# Patient Record
Sex: Female | Born: 2000 | Race: Black or African American | Hispanic: No | Marital: Single | Smoking: Never smoker
Health system: Southern US, Community
[De-identification: ages and names within clinical notes are randomized; demographics above are authoritative.]

## PROBLEM LIST (undated history)

## (undated) DIAGNOSIS — G43909 Migraine, unspecified, not intractable, without status migrainosus: Secondary | ICD-10-CM

## (undated) DIAGNOSIS — B009 Herpesviral infection, unspecified: Secondary | ICD-10-CM

## (undated) HISTORY — DX: Herpesviral infection, unspecified: B00.9

---
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
## 2018-02-03 ENCOUNTER — Other Ambulatory Visit: Payer: Self-pay

## 2018-02-03 ENCOUNTER — Emergency Department (HOSPITAL_COMMUNITY)
Admission: EM | Admit: 2018-02-03 | Discharge: 2018-02-04 | Disposition: A | Discharge status: Home / Self Care | Payer: Medicaid Other | Attending: Emergency Medicine | Admitting: Emergency Medicine

## 2018-02-03 ENCOUNTER — Encounter (HOSPITAL_COMMUNITY): Payer: Self-pay | Admitting: Emergency Medicine

## 2018-02-03 DIAGNOSIS — Z79899 Other long term (current) drug therapy: Secondary | ICD-10-CM | POA: Diagnosis not present

## 2018-02-03 DIAGNOSIS — K0889 Other specified disorders of teeth and supporting structures: Secondary | ICD-10-CM | POA: Diagnosis not present

## 2018-02-03 NOTE — ED Triage Notes (Signed)
Co R upper dental abscess x 4 days.  Denies fever.

## 2018-02-04 MED ORDER — IBUPROFEN 600 MG PO TABS: 600.0000 mg | ORAL_TABLET | Freq: Four times a day (QID) | ORAL | 0 refills | Status: DC | PRN

## 2018-02-04 MED ORDER — LIDOCAINE VISCOUS HCL 2 % MT SOLN: 15.0000 mL | OROMUCOSAL | 2 refills | Status: DC | PRN

## 2018-02-04 MED ORDER — PENICILLIN V POTASSIUM 500 MG PO TABS: 500.0000 mg | ORAL_TABLET | Freq: Four times a day (QID) | ORAL | 0 refills | Status: AC

## 2018-02-04 NOTE — ED Provider Notes (Signed)
[facility] Provider Note   CSN: [id number] Arrival date & time: 02/03/18  2258     History   Chief Complaint Chief Complaint  Patient presents with  . dental abscess    HPI Sharon Steele is a 17 y.o. female.  HPI   Sharon Steele is a 17 y.o. female, patient with no pertinent past medical history, presenting to the ED with right upper dental pain beginning yesterday morning.  Accompanied by facial swelling.  Pain is moderate to severe, throbbing, nonradiating.  She has not tried any therapies for the pain.  She has a dental appointment scheduled for September 18. Denies fever/chills, nausea/vomiting, difficulty breathing or swallowing, or any other complaints.   History reviewed. No pertinent past medical history.  There are no active problems to display for this patient.   History reviewed. No pertinent surgical history.   OB History   None      Home Medications    Prior to Admission medications   Medication Sig Start Date End Date Taking? Authorizing Provider  cetirizine (ZYRTEC) 10 MG tablet Take 1 tablet (10 mg total) by mouth daily. For 3 days 09/06/16 09/09/16  Ree Shayeis, Jamie, MD  diclofenac (VOLTAREN) 50 MG EC tablet Take 1 tablet (50 mg total) by mouth 2 (two) times daily. 12/08/17   Elson AreasSofia, Leslie K, PA-C  ibuprofen (ADVIL,MOTRIN) 600 MG tablet Take 1 tablet (600 mg total) by mouth every 6 (six) hours as needed. 02/04/18   Natausha Jungwirth C, PA-C  ipratropium (ATROVENT) 0.06 % nasal spray Place 2 sprays into both nostrils 4 (four) times daily. Patient not taking: Reported on 09/06/2016 07/04/16   Deatra Canterxford, William J, FNP  lidocaine (XYLOCAINE) 2 % solution Use as directed 15 mLs in the mouth or throat as needed for mouth pain. 02/04/18   Jermia Rigsby C, PA-C  penicillin v potassium (VEETID) 500 MG tablet Take 1 tablet (500 mg total) by mouth 4 (four) times daily for 7 days. 02/04/18 02/11/18  Anselm PancoastJoy, Mckinley Adelstein C, PA-C    Family History No  family history on file.  Social History Social History   Tobacco Use  . Smoking status: Never Smoker  . Smokeless tobacco: Never Used  Substance Use Topics  . Alcohol use: No  . Drug use: Not Currently     Allergies   Cashew nut oil and Almond oil   Review of Systems Review of Systems  Constitutional: Negative for chills and fever.  HENT: Positive for dental problem and facial swelling. Negative for trouble swallowing and voice change.   Respiratory: Negative for shortness of breath.   Gastrointestinal: Negative for nausea and vomiting.     Physical Exam Updated Vital Signs BP (!) 139/98 (BP Location: Right Arm)   Pulse 69   Temp 98.6 F (37 C)   Resp 18   SpO2 99%   Physical Exam  Constitutional: She appears well-developed and well-nourished. No distress.  HENT:  Head: Normocephalic and atraumatic.  Tenderness in the region of the right rearmost maxillary molar.  Some adjacent facial swelling noted. No noted area of intraoral swelling or fluctuance.  No trismus.  Mouth opening to at least 3 finger widths.  Handles oral secretions without difficulty.  No swelling or tenderness to the submental or submandibular regions.  No swelling or tenderness into the soft tissues of the neck.  Eyes: Conjunctivae are normal.  Neck: Normal range of motion. Neck supple.  Cardiovascular: Normal rate and regular rhythm.  Pulmonary/Chest:  Effort normal.  Lymphadenopathy:    She has no cervical adenopathy.  Neurological: She is alert.  Skin: Skin is warm and dry. She is not diaphoretic. No pallor.  Psychiatric: She has a normal mood and affect. Her behavior is normal.  Nursing note and vitals reviewed.    ED Treatments / Results  Labs (all labs ordered are listed, but only abnormal results are displayed) Labs Reviewed - No data to display  EKG None  Radiology No results found.  Procedures Procedures (including critical care time)  Medications Ordered in  ED Medications - No data to display   Initial Impression / Assessment and Plan / ED Course  I have reviewed the triage vital signs and the nursing notes.  Pertinent labs & imaging results that were available during my care of the patient were reviewed by me and considered in my medical decision making (see chart for details).     Patient presents with right upper dental pain.  Low suspicion for sepsis or Ludwig's angioedema.  She already has dental follow-up set up.  She was offered a dental block with explanation, but declined. The patient was given instructions for home care as well as return precautions. Patient voices understanding of these instructions, accepts the plan, and is comfortable with discharge.  Final Clinical Impressions(s) / ED Diagnoses   Final diagnoses:  Pain, dental    ED Discharge Orders         Ordered    penicillin v potassium (VEETID) 500 MG tablet  4 times daily     02/04/18 0142    lidocaine (XYLOCAINE) 2 % solution  As needed     02/04/18 0142    ibuprofen (ADVIL,MOTRIN) 600 MG tablet  Every 6 hours PRN     02/04/18 0142           Kashton Mcartor, Hillard Danker, PA-C 02/04/18 0222    Ward, Layla Maw, DO 02/04/18 0236

## 2018-02-04 NOTE — Discharge Instructions (Signed)
°  Dental Pain You have been seen today for dental pain. You should follow up with a dentist as soon as possible. This problem will not resolve on its own without the care of a dentist. Use ibuprofen or naproxen for pain. Use the viscous lidocaine for mouth pain. Swish with the lidocaine and spit it out. Do not swallow it. You should also swish with a homemade salt water solution, twice a day.  Make this solution by mixing 8 ounces of warm water with about half a teaspoon of salt. Antiinflammatory medications: Take 600 mg of ibuprofen every 6 hours or 440 mg (over the counter dose) to 500 mg (prescription dose) of naproxen every 12 hours for the next 3 days. After this time, these medications may be used as needed for pain. Take these medications with food to avoid upset stomach. Choose only one of these medications, do not take them together. Acetaminophen (generic for Tylenol): Should you continue to have additional pain while taking the ibuprofen or naproxen, you may add in acetaminophen as needed. Your daily total maximum amount of acetaminophen from all sources should be limited to 4000mg /day for persons without liver problems, or 2000mg /day for those with liver problems.  Please take all of your antibiotics until finished!   You may develop abdominal discomfort or diarrhea from the antibiotic.  You may help offset this with probiotics which you can buy or get in yogurt. Do not eat or take the probiotics until 2 hours after your antibiotic.   Dental Resource Guide  [facility] [address], [address] [place], [place] [address] [phone]  [facility] [address] [place], [place] [phone]  [facility] [address] [place], [place] [address] [phone]. [phone]  [facility] [address], [address] [place], [place] [address] [phone]  [facility] [address] [place], [place]  [address] [phone]) [phone]  [facility] [employer] [facility] [employer] [email or web address]/[email or web address]  [employer] [address] [place], [place] [address] [phone]  Website for free, low-income, or sliding scale dental services in [place]: www.freedentalcare.us  To find a dentist in [place] and surrounding areas: [employer].siwww.ncdental.org/for-the-public/find-[email or web address]-dentist  [facility] [employer].athttp://www.ncdental.org/meetings-events/[place]-missions-of-mercy  [facility] Medicaid Dentist http://www.harris.net/https://dma.ncdhhs.gov/find-a-doctor/medicaid-dental-providers

## 2018-11-14 DIAGNOSIS — B373 Candidiasis of vulva and vagina: Secondary | ICD-10-CM | POA: Diagnosis not present

## 2018-11-14 DIAGNOSIS — Z113 Encounter for screening for infections with a predominantly sexual mode of transmission: Secondary | ICD-10-CM | POA: Diagnosis not present

## 2019-01-15 DIAGNOSIS — Z113 Encounter for screening for infections with a predominantly sexual mode of transmission: Secondary | ICD-10-CM | POA: Diagnosis not present

## 2019-01-15 DIAGNOSIS — Z0389 Encounter for observation for other suspected diseases and conditions ruled out: Secondary | ICD-10-CM | POA: Diagnosis not present

## 2019-01-15 DIAGNOSIS — N390 Urinary tract infection, site not specified: Secondary | ICD-10-CM | POA: Diagnosis not present

## 2019-01-15 DIAGNOSIS — Z3042 Encounter for surveillance of injectable contraceptive: Secondary | ICD-10-CM | POA: Diagnosis not present

## 2019-01-15 DIAGNOSIS — Z3009 Encounter for other general counseling and advice on contraception: Secondary | ICD-10-CM | POA: Diagnosis not present

## 2019-01-15 DIAGNOSIS — Z114 Encounter for screening for human immunodeficiency virus [HIV]: Secondary | ICD-10-CM | POA: Diagnosis not present

## 2019-01-15 DIAGNOSIS — Z1388 Encounter for screening for disorder due to exposure to contaminants: Secondary | ICD-10-CM | POA: Diagnosis not present

## 2019-01-27 ENCOUNTER — Other Ambulatory Visit: Payer: Self-pay

## 2019-01-27 ENCOUNTER — Emergency Department (HOSPITAL_COMMUNITY)
Admission: EM | Admit: 2019-01-27 | Discharge: 2019-01-27 | Disposition: A | Discharge status: Home / Self Care | Payer: Medicaid Other | Attending: Emergency Medicine | Admitting: Emergency Medicine

## 2019-01-27 ENCOUNTER — Encounter (HOSPITAL_COMMUNITY): Payer: Self-pay | Admitting: Emergency Medicine

## 2019-01-27 DIAGNOSIS — Z5321 Procedure and treatment not carried out due to patient leaving prior to being seen by health care provider: Secondary | ICD-10-CM | POA: Insufficient documentation

## 2019-01-27 DIAGNOSIS — M791 Myalgia, unspecified site: Secondary | ICD-10-CM | POA: Diagnosis not present

## 2019-01-27 DIAGNOSIS — J029 Acute pharyngitis, unspecified: Secondary | ICD-10-CM | POA: Insufficient documentation

## 2019-01-27 LAB — GROUP A STREP BY PCR: Group A Strep by PCR: NOT DETECTED

## 2019-01-27 NOTE — ED Notes (Signed)
Pt stated she has class in the morning and was leaving.

## 2019-01-27 NOTE — ED Triage Notes (Signed)
Pt to ED with c/o sore throat and body aches x;s 3-4 days.  Pt st's she has taken OTC cold meds without relief.

## 2019-02-06 DIAGNOSIS — Z1159 Encounter for screening for other viral diseases: Secondary | ICD-10-CM | POA: Diagnosis not present

## 2019-02-26 ENCOUNTER — Emergency Department (HOSPITAL_COMMUNITY)
Admission: EM | Admit: 2019-02-26 | Discharge: 2019-02-26 | Disposition: A | Discharge status: Home / Self Care | Payer: Medicaid Other | Attending: Emergency Medicine | Admitting: Emergency Medicine

## 2019-02-26 ENCOUNTER — Encounter (HOSPITAL_COMMUNITY): Payer: Self-pay

## 2019-02-26 DIAGNOSIS — L0501 Pilonidal cyst with abscess: Secondary | ICD-10-CM | POA: Diagnosis not present

## 2019-02-26 DIAGNOSIS — Z79899 Other long term (current) drug therapy: Secondary | ICD-10-CM | POA: Diagnosis not present

## 2019-02-26 DIAGNOSIS — L0231 Cutaneous abscess of buttock: Secondary | ICD-10-CM | POA: Diagnosis present

## 2019-02-26 MED ORDER — IBUPROFEN 400 MG PO TABS
600.0000 mg | ORAL_TABLET | Freq: Once | ORAL | Status: AC
  Administered 2019-02-26: 600 mg via ORAL
  Filled 2019-02-26: qty 1

## 2019-02-26 MED ORDER — SULFAMETHOXAZOLE-TRIMETHOPRIM 800-160 MG PO TABS: 1.0000 | ORAL_TABLET | Freq: Two times a day (BID) | ORAL | 0 refills | Status: AC

## 2019-02-26 MED ORDER — ACETAMINOPHEN 325 MG PO TABS
650.0000 mg | ORAL_TABLET | Freq: Once | ORAL | Status: AC
  Administered 2019-02-26: 650 mg via ORAL
  Filled 2019-02-26: qty 2

## 2019-02-26 NOTE — ED Triage Notes (Signed)
Pt to ED today for a cyst on her tailbone. 10/10 pain. States she can't sit down. Also presents with abscess in mouth with 8/10 pain.

## 2019-02-26 NOTE — Discharge Instructions (Signed)
You will need to remove your packing in 2 days, you can either remove this at home or return to ER or follow-up with general surgery. Apply warm compresses to area for 20 minutes at a time 3 times daily.   Take antibiotics as prescribed and complete the full course.

## 2019-02-26 NOTE — ED Notes (Signed)
Patient verbalizes understanding of discharge instructions. Opportunity for questioning and answers were provided. Armband removed by staff, pt discharged from ED ambulatory.   

## 2019-02-26 NOTE — ED Provider Notes (Signed)
[facility] EMERGENCY DEPARTMENT Provider Note   CSN: [id number] Arrival date & time: 02/26/19  2027     History   Chief Complaint Chief Complaint  Patient presents with  . Cyst    HPI Sharon [facility] is a 18 y.o. female.     50 female presents with complaint of a cyst on her tailbone, states this flares up on her on occasion, has never had a head injury, reports worsening pain on Wednesday without drainage.  No other complaints or concerns chronic     History reviewed. No pertinent past medical history.  There are no active problems to display for this patient.   History reviewed. No pertinent surgical history.   OB History   No obstetric history on file.      Home Medications    Prior to Admission medications   Medication Sig Start Date End Date Taking? Authorizing Provider  cetirizine (ZYRTEC) 10 MG tablet Take 1 tablet (10 mg total) by mouth daily. For 3 days 09/06/16 09/09/16  Ree Shay, MD  diclofenac (VOLTAREN) 50 MG EC tablet Take 1 tablet (50 mg total) by mouth 2 (two) times daily. 12/08/17   Elson Areas, PA-C  ibuprofen (ADVIL,MOTRIN) 600 MG tablet Take 1 tablet (600 mg total) by mouth every 6 (six) hours as needed. 02/04/18   Joy, Shawn C, PA-C  ipratropium (ATROVENT) 0.06 % nasal spray Place 2 sprays into both nostrils 4 (four) times daily. Patient not taking: Reported on 09/06/2016 07/04/16   Deatra Canter, FNP  lidocaine (XYLOCAINE) 2 % solution Use as directed 15 mLs in the mouth or throat as needed for mouth pain. 02/04/18   Joy, Shawn C, PA-C  sulfamethoxazole-trimethoprim (BACTRIM DS) 800-160 MG tablet Take 1 tablet by mouth 2 (two) times daily for 7 days. 02/26/19 03/05/19  Jeannie Fend, PA-C    Family History History reviewed. No pertinent family history.  Social History Social History   Tobacco Use  . Smoking status: Never Smoker  . Smokeless tobacco: Never Used  Substance Use Topics  . Alcohol use: No  . Drug  use: Not Currently     Allergies   Cashew nut oil and Almond oil   Review of Systems Review of Systems  Constitutional: Negative for fever.  Gastrointestinal: Negative for abdominal pain and rectal pain.  Genitourinary: Negative for dysuria and frequency.  Musculoskeletal: Negative for back pain.  Skin: Negative for rash and wound.  Allergic/Immunologic: Negative for immunocompromised state.  Psychiatric/Behavioral: Negative for confusion.  All other systems reviewed and are negative.    Physical Exam Updated Vital Signs BP 127/77 (BP Location: Left Arm)   Pulse 100   Temp 98.8 F (37.1 C) (Oral)   Resp 18   SpO2 100%   Physical Exam Vitals signs and nursing note reviewed. Exam conducted with a chaperone present.  Constitutional:      General: She is not in acute distress.    Appearance: She is well-developed. She is not diaphoretic.  HENT:     Head: Normocephalic and atraumatic.  Pulmonary:     Effort: Pulmonary effort is normal.  Abdominal:     Palpations: Abdomen is soft.     Tenderness: There is no abdominal tenderness.  Musculoskeletal:        General: No tenderness.  Skin:    General: Skin is warm and dry.     Findings: Erythema present.       Neurological:     Mental Status:  She is alert and oriented to person, place, and time.  Psychiatric:        Behavior: Behavior normal.      ED Treatments / Results  Labs (all labs ordered are listed, but only abnormal results are displayed) Labs Reviewed - No data to display  EKG None  Radiology No results found.  Procedures .Marland KitchenIncision and Drainage  Date/Time: 02/26/2019 9:53 PM Performed by: Tacy Learn, PA-C Authorized by: Tacy Learn, PA-C   Consent:    Consent obtained:  Verbal   Consent given by:  Patient   Risks discussed:  Bleeding, incomplete drainage, pain and damage to other organs   Alternatives discussed:  No treatment Universal protocol:    Procedure explained and  questions answered to patient or proxy's satisfaction: yes     Relevant documents present and verified: yes     Test results available and properly labeled: yes     Imaging studies available: yes     Required blood products, implants, devices, and special equipment available: yes     Site/side marked: yes     Immediately prior to procedure a time out was called: yes     Patient identity confirmed:  Verbally with patient Location:    Type:  Pilonidal cyst   Size:  1cm x 2cm   Location:  Trunk Pre-procedure details:    Skin preparation:  Betadine Anesthesia (see MAR for exact dosages):    Anesthesia method:  Local infiltration   Local anesthetic:  Lidocaine 2% w/o epi Procedure type:    Complexity:  Complex Procedure details:    Incision types:  Single straight   Incision depth:  Subcutaneous   Scalpel blade:  11   Wound management:  Probed and deloculated, irrigated with saline and extensive cleaning   Drainage:  Purulent   Drainage amount:  Moderate   Packing materials:  1/4 in gauze   Amount 1/4":  2" Post-procedure details:    Patient tolerance of procedure:  Tolerated well, no immediate complications   (including critical care time)  Medications Ordered in ED Medications  ibuprofen (ADVIL) tablet 600 mg (600 mg Oral Given 02/26/19 2145)  acetaminophen (TYLENOL) tablet 650 mg (650 mg Oral Given 02/26/19 2145)     Initial Impression / Assessment and Plan / ED Course  I have reviewed the triage vital signs and the nursing notes.  Pertinent labs & imaging results that were available during my care of the patient were reviewed by me and considered in my medical decision making (see chart for details).  Clinical Course as of Feb 26 2155  Wed Oct 07, 257  4145 18 year old female presents with complaint of painful pilonidal cyst with abscess, successfully I&D with packing placed, dc with rx for bactrim, referred to general surgery for follow up. Recommend packing removal in 2  days. Return to ER for new or worsening symptoms.    [LM]    Clinical Course User Index [LM] Tacy Learn, PA-C      Final Clinical Impressions(s) / ED Diagnoses   Final diagnoses:  Pilonidal cyst with abscess    ED Discharge Orders         Ordered    sulfamethoxazole-trimethoprim (BACTRIM DS) 800-160 MG tablet  2 times daily     02/26/19 2117           Tacy Learn, PA-C 02/26/19 2156    Drenda Freeze, MD 02/27/19 1510

## 2019-02-28 DIAGNOSIS — S31000A Unspecified open wound of lower back and pelvis without penetration into retroperitoneum, initial encounter: Secondary | ICD-10-CM | POA: Diagnosis not present

## 2019-03-12 DIAGNOSIS — Z202 Contact with and (suspected) exposure to infections with a predominantly sexual mode of transmission: Secondary | ICD-10-CM | POA: Diagnosis not present

## 2019-03-18 DIAGNOSIS — L0501 Pilonidal cyst with abscess: Secondary | ICD-10-CM | POA: Diagnosis not present

## 2019-04-16 DIAGNOSIS — Z3009 Encounter for other general counseling and advice on contraception: Secondary | ICD-10-CM | POA: Diagnosis not present

## 2019-05-02 DIAGNOSIS — Z20828 Contact with and (suspected) exposure to other viral communicable diseases: Secondary | ICD-10-CM | POA: Diagnosis not present

## 2019-07-09 ENCOUNTER — Ambulatory Visit: Payer: Medicaid Other | Admitting: Podiatry

## 2019-10-14 ENCOUNTER — Emergency Department (HOSPITAL_COMMUNITY)
Admission: EM | Admit: 2019-10-14 | Discharge: 2019-10-14 | Disposition: A | Discharge status: Home / Self Care | Payer: Medicaid Other | Attending: Emergency Medicine | Admitting: Emergency Medicine

## 2019-10-14 ENCOUNTER — Other Ambulatory Visit: Payer: Self-pay

## 2019-10-14 ENCOUNTER — Encounter (HOSPITAL_COMMUNITY): Payer: Self-pay | Admitting: Emergency Medicine

## 2019-10-14 DIAGNOSIS — Z3202 Encounter for pregnancy test, result negative: Secondary | ICD-10-CM | POA: Insufficient documentation

## 2019-10-14 DIAGNOSIS — R102 Pelvic and perineal pain: Secondary | ICD-10-CM | POA: Diagnosis not present

## 2019-10-14 LAB — I-STAT BETA HCG BLOOD, ED (MC, WL, AP ONLY): I-stat hCG, quantitative: <5 m[IU]/mL (ref <5)

## 2019-10-14 NOTE — ED Notes (Signed)
Pt d/c home per MD order. Discharge summary reviewed with pt, pt verbalizes understanding. No s/s of acute distress noted. Ambulatory off unit.  °

## 2019-10-14 NOTE — ED Triage Notes (Signed)
Pt c/o "pregnancy symptoms" x 2 mos. Reports color changes to breasts, nausea, and occasional spotting.

## 2019-10-14 NOTE — ED Provider Notes (Signed)
[facility] EMERGENCY DEPARTMENT Provider Note   CSN: [id number] Arrival date & time: 10/14/19  1507     History Chief Complaint  Patient presents with  . Possible Pregnancy    Sharon Steele is a 19 y.o. female.  HPI HPI Comments: Sharon Steele is a 19 y.o. female who presents to the Emergency Department complaining of possible pregnancy.  Patient states she was on the Depo-Provera shot for years and stopped it about 1 year ago.  Since she stopped taking that she has been experiencing some mild weight gain, darkened nipples, intermittent nausea, difficulty sleeping, increased appetite.  Last week she began experiencing some mild intermittent pelvic cramping as well as spotting.  She has Medicaid insurance but has had difficulty finding a primary care provider or OB/GYN in the area.  Because of her symptoms she is concerned that she could be pregnant.  She has no other complaints at this time.     History reviewed. No pertinent past medical history.  There are no problems to display for this patient.   History reviewed. No pertinent surgical history.   OB History   No obstetric history on file.     No family history on file.  Social History   Tobacco Use  . Smoking status: Never Smoker  . Smokeless tobacco: Never Used  Substance Use Topics  . Alcohol use: No  . Drug use: Not Currently    Home Medications Prior to Admission medications   Medication Sig Start Date End Date Taking? Authorizing Provider  cetirizine (ZYRTEC) 10 MG tablet Take 1 tablet (10 mg total) by mouth daily. For 3 days 09/06/16 09/09/16  Harlene Salts, MD  diclofenac (VOLTAREN) 50 MG EC tablet Take 1 tablet (50 mg total) by mouth 2 (two) times daily. 12/08/17   Fransico Meadow, PA-C  ibuprofen (ADVIL,MOTRIN) 600 MG tablet Take 1 tablet (600 mg total) by mouth every 6 (six) hours as needed. 02/04/18   Joy, Shawn C, PA-C  ipratropium (ATROVENT) 0.06 % nasal spray Place 2 sprays into  both nostrils 4 (four) times daily. Patient not taking: Reported on 09/06/2016 07/04/16   Lysbeth Penner, FNP  lidocaine (XYLOCAINE) 2 % solution Use as directed 15 mLs in the mouth or throat as needed for mouth pain. 02/04/18   Joy, Shawn C, PA-C    Allergies    Cashew nut oil and Almond oil  Review of Systems   Review of Systems  All other systems reviewed and are negative. Ten systems reviewed and are negative for acute change, except as noted in the HPI.   Physical Exam Updated Vital Signs BP 121/65   Pulse 61   Temp 98.4 F (36.9 C) (Oral)   Resp 16   SpO2 99%   Physical Exam Vitals and nursing note reviewed.  Constitutional:      General: She is not in acute distress.    Appearance: Normal appearance. She is not ill-appearing, toxic-appearing or diaphoretic.  HENT:     Head: Normocephalic and atraumatic.     Right Ear: External ear normal.     Left Ear: External ear normal.     Nose: Nose normal.     Mouth/Throat:     Mouth: Mucous membranes are moist.     Pharynx: Oropharynx is clear. No oropharyngeal exudate or posterior oropharyngeal erythema.  Eyes:     Extraocular Movements: Extraocular movements intact.  Cardiovascular:     Rate and Rhythm: Normal rate and regular  rhythm.     Pulses: Normal pulses.     Heart sounds: Normal heart sounds. No murmur. No friction rub. No gallop.   Pulmonary:     Effort: Pulmonary effort is normal. No respiratory distress.     Breath sounds: Normal breath sounds. No stridor. No wheezing, rhonchi or rales.  Abdominal:     General: Abdomen is flat.     Palpations: Abdomen is soft.     Tenderness: There is no abdominal tenderness.  Musculoskeletal:        General: Normal range of motion.     Cervical back: Normal range of motion and neck supple. No tenderness.  Skin:    General: Skin is warm and dry.  Neurological:     General: No focal deficit present.     Mental Status: She is alert and oriented to person, place, and time.    Psychiatric:        Mood and Affect: Mood normal.        Behavior: Behavior normal.    ED Results / Procedures / Treatments   Labs (all labs ordered are listed, but only abnormal results are displayed) Labs Reviewed  I-STAT BETA HCG BLOOD, ED ([facility], [facility], [facility] ONLY)    EKG None  Radiology No results found.  Procedures Procedures (including critical care time)  Medications Ordered in ED Medications - No data to display  ED Course  I have reviewed the triage vital signs and the nursing notes.  Pertinent labs & imaging results that were available during my care of the patient were reviewed by me and considered in my medical decision making (see chart for details).    MDM Rules/Calculators/A&P                      Patient is a pleasant 19 year old female that presents with multiple complaints.  These all seem to have started intermittently since she stopped taking the Depo-Provera injection.  She was concerned today that she could be pregnant.  Her pregnancy test was negative.  I discussed this with the patient.  She has Medicaid but is frustrated that she has not been able to be evaluated by a primary care provider.  I recommended that she reach out to [facility] and get a list of primary care providers in the area.  I additionally gave her referral to [facility] community health and wellness.  She understands to reach out to them tomorrow to discuss his visit as well as her symptoms.  We discussed the fact that her symptoms and the length of time can be consistent with discontinuation of Depo-Provera.  She understands to return to the emergency department with any new or worsening symptoms.  Her questions were answered and she was amicable at the time of discharge.  Her vital signs are stable.  Patient discharged to home/self care.  Condition at discharge: Stable  Note: Portions of this report may have been transcribed using voice recognition software. Every effort was made to ensure  accuracy; however, inadvertent computerized transcription errors may be present.    Final Clinical Impression(s) / ED Diagnoses Final diagnoses:  Negative pregnancy test   Rx / DC Orders ED Discharge Orders    None       Placido Sou, PA-C 10/14/19 Warrick Parisian, MD 10/15/19 1032

## 2019-10-14 NOTE — Discharge Instructions (Signed)
Please reach out to [facility] community health and wellness.  I would also recommend calling Medicaid and getting a list of primary care providers in your area.

## 2019-10-27 NOTE — Progress Notes (Signed)
Patient ID: Sharon Steele, female   DOB: 09/14/2000, 19 y.o.   MRN: [id number]   Sharon Steele, is a 19 y.o. female  ZYS:[id number]  XNA:[id number]  DOB - 04/22/2001  Subjective:  Chief Complaint and HPI: Sharon Steele is a 19 y.o. female here today to establish care and for a follow up visit  after ED visit 10/14/2019 for pregnancy test and some other symptoms.  She was on depo provera and stopped it about 1 year ago.  Not on BC now but has stopped having sex.  She has been having symptoms of fatigue, nausea without vomiting, and some vaginal itching since about 06/2019.  She denies fever or pelvic pain.  No urinary s/sx.  No fever.  Not interested in [facility].    From ED note: Patient is a pleasant 19 year old female that presents with multiple complaints.  These all seem to have started intermittently since she stopped taking the Depo-Provera injection.  She was concerned today that she could be pregnant.  Her pregnancy test was negative.  I discussed this with the patient.  She has Medicaid but is frustrated that she has not been able to be evaluated by a primary care provider.  I recommended that she reach out to [facility] and get a list of primary care providers in the area.  I additionally gave her referral to [facility] community health and wellness.  She understands to reach out to them tomorrow to discuss his visit as well as her symptoms.  We discussed the fact that her symptoms and the length of time can be consistent with discontinuation of Depo-Provera.  ED/Hospital notes reviewed.   Family history:  Dad with htn, mom with htn, maternal gm-DM  ROS:   Constitutional:  No f/c, No night sweats, No unexplained weight loss. EENT:  No vision changes, No blurry vision, No hearing changes. No mouth, throat, or ear problems.  Respiratory: No cough, No SOB Cardiac: No CP, no palpitations GI:  No abd pain, No V/D.  +nausea GU: No Urinary s/sx Musculoskeletal: No joint pain Neuro: No  headache, no dizziness, no motor weakness.  Skin: No rash Endocrine:  No polydipsia. No polyuria.  Psych: Denies SI/HI  No problems updated.  ALLERGIES: Allergies  Allergen Reactions  . Cashew Nut Oil Hives, Itching and Swelling  . Almond Oil     PAST MEDICAL HISTORY: No past medical history on file.  MEDICATIONS AT HOME: Prior to Admission medications   Medication Sig Start Date End Date Taking? Authorizing Provider  cetirizine (ZYRTEC) 10 MG tablet Take 1 tablet (10 mg total) by mouth daily. For 3 days 09/06/16 09/09/16  Harlene Salts, MD  diclofenac (VOLTAREN) 50 MG EC tablet Take 1 tablet (50 mg total) by mouth 2 (two) times daily. 12/08/17   Fransico Meadow, PA-C  ibuprofen (ADVIL,MOTRIN) 600 MG tablet Take 1 tablet (600 mg total) by mouth every 6 (six) hours as needed. 02/04/18   Joy, Shawn C, PA-C  ipratropium (ATROVENT) 0.06 % nasal spray Place 2 sprays into both nostrils 4 (four) times daily. Patient not taking: Reported on 09/06/2016 07/04/16   Lysbeth Penner, FNP  lidocaine (XYLOCAINE) 2 % solution Use as directed 15 mLs in the mouth or throat as needed for mouth pain. 02/04/18   Joy, Shawn C, PA-C  valACYclovir (VALTREX) 1000 MG tablet Take 1,000 mg by mouth daily. 09/13/19   [provider]     Objective:  EXAM:   Vitals:   10/29/19 1355  BP: 117/83  Pulse: 62  Resp: 16  Temp: 97.9 F (36.6 C)  SpO2: 99%  Weight: 168 lb 9.6 oz (76.5 kg)  Height: 5' (1.524 m)    General appearance : A&OX3. NAD. Non-toxic-appearing HEENT: Atraumatic and Normocephalic.  PERRLA. EOM intact.  TM clear B. Mouth-MMM, post pharynx WNL w/o erythema, No PND. Neck: supple, no JVD. No cervical lymphadenopathy. No thyromegaly Chest/Lungs:  Breathing-non-labored, Good air entry bilaterally, breath sounds normal without rales, rhonchi, or wheezing  CVS: S1 S2 regular, no murmurs, gallops, rubs  Abdomen: Bowel sounds present, Non tender and not distended with no gaurding, rigidity or  rebound. Extremities: Bilateral Lower Ext shows no edema, both legs are warm to touch with = pulse throughout Neurology:  CN II-XII grossly intact, Non focal.   Psych:  TP linear. J/I WNL. Normal speech. Appropriate eye contact and affect.  Skin:  No Rash  Data Review No results found for: HGBA1C   Assessment & Plan   1. Amenorrhea due to Depo Provera counseled on BC.  Currently declines.  Last pap was 03/2019.  - Comprehensive metabolic panel - TSH  2. Nausea - Comprehensive metabolic panel - TSH  3. Fatigue, unspecified type  - Vitamin D, 25-hydroxy  4. Vaginal discharge - Comprehensive metabolic panel - Cervicovaginal ancillary only   Patient have been counseled extensively about nutrition and exercise  Return in about 6 weeks (around 12/10/2019) for assign PCP.  The patient was given clear instructions to go to ER or return to [facility] if symptoms don't improve, worsen or new problems develop. The patient verbalized understanding. The patient was told to call to get lab results if they haven't heard anything in the next week.     Georgian Co, PA-C [facility] [place], [place] [phone]   10/29/2019, 2:06 PM

## 2019-10-29 ENCOUNTER — Other Ambulatory Visit (HOSPITAL_COMMUNITY)
Admission: RE | Admit: 2019-10-29 | Discharge: 2019-10-29 | Disposition: A | Discharge status: Home / Self Care | Payer: Medicaid Other | Source: Ambulatory Visit | Attending: Family Medicine | Admitting: Family Medicine

## 2019-10-29 ENCOUNTER — Other Ambulatory Visit: Payer: Self-pay

## 2019-10-29 ENCOUNTER — Ambulatory Visit: Payer: Medicaid Other | Attending: Family Medicine | Admitting: Physician Assistant

## 2019-10-29 VITALS — BP 117/83 | HR 62 | Temp 97.9°F | Resp 16 | Ht 60.0 in | Wt 168.6 lb

## 2019-10-29 DIAGNOSIS — N898 Other specified noninflammatory disorders of vagina: Secondary | ICD-10-CM | POA: Insufficient documentation

## 2019-10-29 DIAGNOSIS — Z79899 Other long term (current) drug therapy: Secondary | ICD-10-CM | POA: Diagnosis not present

## 2019-10-29 DIAGNOSIS — N912 Amenorrhea, unspecified: Secondary | ICD-10-CM | POA: Diagnosis not present

## 2019-10-29 DIAGNOSIS — R110 Nausea: Secondary | ICD-10-CM | POA: Insufficient documentation

## 2019-10-29 DIAGNOSIS — R5383 Other fatigue: Secondary | ICD-10-CM | POA: Insufficient documentation

## 2019-10-30 ENCOUNTER — Other Ambulatory Visit: Payer: Self-pay | Admitting: Physician Assistant

## 2019-10-30 LAB — CERVICOVAGINAL ANCILLARY ONLY
Bacterial Vaginitis (gardnerella): NEGATIVE
Candida Glabrata: NEGATIVE
Candida Vaginitis: POSITIVE — AB
Chlamydia: NEGATIVE
Comment: NEGATIVE
Comment: NEGATIVE
Comment: NEGATIVE
Comment: NEGATIVE
Comment: NEGATIVE
Comment: NORMAL
Neisseria Gonorrhea: NEGATIVE
Trichomonas: NEGATIVE

## 2019-10-30 LAB — COMPREHENSIVE METABOLIC PANEL
ALT: 12 IU/L (ref 0–32)
AST: 14 IU/L (ref 0–40)
Albumin/Globulin Ratio: 1.4 (ref 1.2–2.2)
Albumin: 4.6 g/dL (ref 3.9–5.0)
Alkaline Phosphatase: 77 IU/L (ref 45–106)
BUN/Creatinine Ratio: 10 (ref 9–23)
BUN: 9 mg/dL (ref 6–20)
Bilirubin Total: 0.4 mg/dL (ref 0.0–1.2)
CO2: 23 mmol/L (ref 20–29)
Calcium: 9.5 mg/dL (ref 8.7–10.2)
Chloride: 103 mmol/L (ref 96–106)
Creatinine, Ser: 0.91 mg/dL (ref 0.57–1.00)
GFR calc Af Amer: 105 mL/min/{1.73_m2} (ref >59)
GFR calc non Af Amer: 91 mL/min/{1.73_m2} (ref >59)
Globulin, Total: 3.2 g/dL (ref 1.5–4.5)
Glucose: 81 mg/dL (ref 65–99)
Potassium: 4 mmol/L (ref 3.5–5.2)
Sodium: 138 mmol/L (ref 134–144)
Total Protein: 7.8 g/dL (ref 6.0–8.5)

## 2019-10-30 LAB — VITAMIN D 25 HYDROXY (VIT D DEFICIENCY, FRACTURES): Vit D, 25-Hydroxy: 14.9 ng/mL — ABNORMAL LOW (ref 30.0–100.0)

## 2019-10-30 LAB — TSH: TSH: 1.2 u[IU]/mL (ref 0.450–4.500)

## 2019-10-30 MED ORDER — FLUCONAZOLE 150 MG PO TABS: 150.0000 mg | ORAL_TABLET | Freq: Once | ORAL | 0 refills | Status: AC

## 2019-10-30 MED ORDER — VITAMIN D (ERGOCALCIFEROL) 1.25 MG (50000 UNIT) PO CAPS: 50000.0000 [IU] | ORAL_CAPSULE | ORAL | 0 refills | Status: DC

## 2019-11-02 ENCOUNTER — Encounter: Payer: Self-pay | Admitting: Physician Assistant

## 2019-11-20 DIAGNOSIS — Z419 Encounter for procedure for purposes other than remedying health state, unspecified: Secondary | ICD-10-CM | POA: Diagnosis not present

## 2019-11-24 ENCOUNTER — Encounter (HOSPITAL_COMMUNITY): Payer: Self-pay | Admitting: Emergency Medicine

## 2019-11-24 ENCOUNTER — Other Ambulatory Visit: Payer: Self-pay

## 2019-11-24 ENCOUNTER — Ambulatory Visit (HOSPITAL_COMMUNITY)
Admission: EM | Admit: 2019-11-24 | Discharge: 2019-11-24 | Disposition: A | Discharge status: Home / Self Care | Payer: Medicaid Other | Attending: Family Medicine | Admitting: Family Medicine

## 2019-11-24 DIAGNOSIS — Z3202 Encounter for pregnancy test, result negative: Secondary | ICD-10-CM

## 2019-11-24 DIAGNOSIS — R109 Unspecified abdominal pain: Secondary | ICD-10-CM

## 2019-11-24 DIAGNOSIS — R1031 Right lower quadrant pain: Secondary | ICD-10-CM | POA: Diagnosis not present

## 2019-11-24 LAB — POCT URINALYSIS DIP (DEVICE)
Bilirubin Urine: NEGATIVE
Glucose, UA: NEGATIVE mg/dL
Hgb urine dipstick: NEGATIVE
Ketones, ur: NEGATIVE mg/dL
Leukocytes,Ua: NEGATIVE
Nitrite: NEGATIVE
Protein, ur: NEGATIVE mg/dL
Specific Gravity, Urine: 1.025 (ref 1.005–1.030)
Urobilinogen, UA: 1 mg/dL (ref 0.0–1.0)
pH: 7 (ref 5.0–8.0)

## 2019-11-24 LAB — POC URINE PREG, ED: Preg Test, Ur: NEGATIVE

## 2019-11-24 MED ORDER — IBUPROFEN 800 MG PO TABS: 800.0000 mg | ORAL_TABLET | Freq: Three times a day (TID) | ORAL | 0 refills | Status: DC

## 2019-11-24 NOTE — Discharge Instructions (Addendum)
You have been seen today for abdominal pain. Your evaluation was not suggestive of any emergent condition requiring medical intervention at this time. However, some abdominal problems make take more time to appear. Therefore, it is very important for you to pay attention to any new symptoms or worsening of your current condition.  Please return here or to the Emergency Department immediately should you begin to feel worse in any way or have any of the following symptoms: increasing or different abdominal pain, persistent vomiting, inability to drink fluids, fevers, or shaking chills.   You have been given the following today for treatment of suspected gonorrhea and/or chlamydia:  We have sent testing for sexually transmitted infections. We will notify you of any positive results once they are received. If required, we will prescribe any medications you might need.  Please refrain from all sexual activity for at least the next seven days.

## 2019-11-24 NOTE — ED Triage Notes (Signed)
Pt c/o of abdominal pain recurrent in LRQ over the past few months. Within the last week has been more consistent. Pt has hx of irregular periods. Blood in discharge last night, with smell. Pt states abdominal pain is worse after intercourse.   Pt took 1000mg  of tylenol at approx 8am today.

## 2019-11-25 LAB — CERVICOVAGINAL ANCILLARY ONLY
Bacterial Vaginitis (gardnerella): POSITIVE — AB
Candida Glabrata: NEGATIVE
Candida Vaginitis: POSITIVE — AB
Chlamydia: NEGATIVE
Comment: NEGATIVE
Comment: NEGATIVE
Comment: NEGATIVE
Comment: NEGATIVE
Comment: NEGATIVE
Comment: NORMAL
Neisseria Gonorrhea: NEGATIVE
Trichomonas: NEGATIVE

## 2019-11-26 ENCOUNTER — Other Ambulatory Visit: Payer: Self-pay | Admitting: Physician Assistant

## 2019-11-26 ENCOUNTER — Telehealth (HOSPITAL_COMMUNITY): Payer: Self-pay | Admitting: Emergency Medicine

## 2019-11-26 MED ORDER — METRONIDAZOLE 500 MG PO TABS: 500.0000 mg | ORAL_TABLET | Freq: Two times a day (BID) | ORAL | 0 refills | Status: DC

## 2019-11-26 MED ORDER — FLUCONAZOLE 150 MG PO TABS: 150.0000 mg | ORAL_TABLET | Freq: Once | ORAL | 0 refills | Status: AC

## 2019-11-26 NOTE — Telephone Encounter (Signed)
Upon chart review, note from [facility] nurse stating medications Flagyl and Diflucan placed, which will cover patient for current infections.  No further treatment needed at this time.  Left voicemail to review labs in case patient was not informed.

## 2019-12-10 ENCOUNTER — Ambulatory Visit: Payer: Medicaid Other | Admitting: Family Medicine

## 2019-12-10 NOTE — ED Provider Notes (Signed)
[facility]   [id number] 11/24/19 Arrival Time: 0908  ASSESSMENT & PLAN:  1. Right lower quadrant abdominal pain     Benign abdominal exam. No indications for urgent abdominal/pelvic imaging at this time. Discussed.  Vaginal cytology pending; no empiric tx desired.   Meds ordered this encounter  Medications  . ibuprofen (ADVIL) 800 MG tablet    Sig: Take 1 tablet (800 mg total) by mouth 3 (three) times daily with meals.    Dispense:  21 tablet    Refill:  0     Discharge Instructions     You have been seen today for abdominal pain. Your evaluation was not suggestive of any emergent condition requiring medical intervention at this time. However, some abdominal problems make take more time to appear. Therefore, it is very important for you to pay attention to any new symptoms or worsening of your current condition.  Please return here or to the Emergency Department immediately should you begin to feel worse in any way or have any of the following symptoms: increasing or different abdominal pain, persistent vomiting, inability to drink fluids, fevers, or shaking chills.   You have been given the following today for treatment of suspected gonorrhea and/or chlamydia:  We have sent testing for sexually transmitted infections. We will notify you of any positive results once they are received. If required, we will prescribe any medications you might need.  Please refrain from all sexual activity for at least the next seven days.     Follow-up Information    Schedule an appointment as soon as possible for a visit  with [facility].   Contact information: [address], [address] [id number] mc [place] [address] [phone]       [facility] EMERGENCY DEPARTMENT.   Specialty: Emergency Medicine Why: If symptoms worsen in any way. Contact information: [address] [id number] mc [place] [address] [phone]               Reviewed expectations re: course of current medical issues. Questions answered. Outlined signs and symptoms indicating need for more acute intervention. Patient verbalized understanding. After Visit Summary given.   SUBJECTIVE: History from: patient. STEPHANINE Steele is a 19 y.o. female who presents with complaint of poorly localized lower abdominal discomfort over the past few months; no pattern identified. Irreg periods. Pain more persistent over the past week. Does not limit ADL. Some vaginal discharge last evening; is concerned over possibility of STI. Normal PO intake without n/v/d. Ambulatoyr without difficulty. Passing gas. Pain does not wake her from sleep.   No LMP recorded. (Menstrual status: Irregular Periods).   History reviewed. No pertinent surgical history.   OBJECTIVE:  Vitals:   11/24/19 1001  BP: 116/77  Pulse: 79  Resp: 16  Temp: 98.3 F (36.8 C)  TempSrc: Oral  SpO2: 100%    General appearance: alert, oriented, no acute distress HEENT: [place]; AT; oropharynx moist Lungs: unlabored respirations Abdomen: soft; without distention; mild  and poorly localized tenderness to palpation over lower abdomen; normal bowel sounds; without masses or organomegaly; without guarding or rebound tenderness Back: without reported CVA tenderness; FROM at waist Extremities: without LE edema; symmetrical; without gross deformities Skin: warm and dry Neurologic: normal gait Psychological: alert and cooperative; normal mood and affect  Labs: Results for orders placed or performed during the hospital encounter of 11/24/19  POCT urinalysis dip (device)  Result Value Ref Range   Glucose,  UA NEGATIVE NEGATIVE mg/dL   Bilirubin Urine NEGATIVE NEGATIVE   Ketones, ur NEGATIVE NEGATIVE mg/dL   Specific Gravity, Urine 1.025 1.005 - 1.030   Hgb urine dipstick NEGATIVE NEGATIVE   pH 7.0 5.0 - 8.0   Protein, ur NEGATIVE NEGATIVE  mg/dL   Urobilinogen, UA 1.0 0.0 - 1.0 mg/dL   Nitrite NEGATIVE NEGATIVE   Leukocytes,Ua NEGATIVE NEGATIVE  POC urine preg, ED (not at [facility])  Result Value Ref Range   Preg Test, Ur NEGATIVE NEGATIVE      Allergies  Allergen Reactions  . Cashew Nut Oil Hives, Itching and Swelling  . Almond Oil                                                History reviewed. No pertinent past medical history.  Social History   Socioeconomic History  . Marital status: Single    Spouse name: Not on file  . Number of children: Not on file  . Years of education: Not on file  . Highest education level: Not on file  Occupational History  . Not on file  Tobacco Use  . Smoking status: Never Smoker  . Smokeless tobacco: Never Used  Vaping Use  . Vaping Use: Never used  Substance and Sexual Activity  . Alcohol use: Yes  . Drug use: Not Currently  . Sexual activity: Yes    Birth control/protection: Condom  Other Topics Concern  . Not on file  Social History Narrative  . Not on file   Social Determinants of Health   Financial Resource Strain:   . Difficulty of Paying Living Expenses:   Food Insecurity:   . Worried About Programme researcher, broadcasting/film/video in the Last Year:   . Barista in the Last Year:   Transportation Needs:   . Freight forwarder (Medical):   Marland Kitchen Lack of Transportation (Non-Medical):   Physical Activity:   . Days of Exercise per Week:   . Minutes of Exercise per Session:   Stress:   . Feeling of Stress :   Social Connections:   . Frequency of Communication with Friends and Family:   . Frequency of Social Gatherings with Friends and Family:   . Attends Religious Services:   . Active Member of Clubs or Organizations:   . Attends Banker Meetings:   Marland Kitchen Marital Status:   Intimate Partner Violence:   . Fear of Current or Ex-Partner:   . Emotionally Abused:   Marland Kitchen Physically Abused:   . Sexually Abused:     Family History  Problem Relation Age of Onset  .  Cancer Mother   . Hypertension Father      Mardella Layman, MD 12/10/19 1012

## 2019-12-21 DIAGNOSIS — Z419 Encounter for procedure for purposes other than remedying health state, unspecified: Secondary | ICD-10-CM | POA: Diagnosis not present

## 2019-12-22 ENCOUNTER — Encounter: Payer: Medicaid Other | Admitting: Obstetrics and Gynecology

## 2019-12-31 DIAGNOSIS — G8911 Acute pain due to trauma: Secondary | ICD-10-CM | POA: Diagnosis not present

## 2019-12-31 DIAGNOSIS — Z743 Need for continuous supervision: Secondary | ICD-10-CM | POA: Diagnosis not present

## 2019-12-31 DIAGNOSIS — S0185XA Open bite of other part of head, initial encounter: Secondary | ICD-10-CM | POA: Diagnosis not present

## 2019-12-31 DIAGNOSIS — S0180XA Unspecified open wound of other part of head, initial encounter: Secondary | ICD-10-CM | POA: Diagnosis not present

## 2019-12-31 DIAGNOSIS — S01551A Open bite of lip, initial encounter: Secondary | ICD-10-CM | POA: Diagnosis not present

## 2019-12-31 DIAGNOSIS — S098XXA Other specified injuries of head, initial encounter: Secondary | ICD-10-CM | POA: Diagnosis not present

## 2019-12-31 DIAGNOSIS — S0993XA Unspecified injury of face, initial encounter: Secondary | ICD-10-CM | POA: Diagnosis not present

## 2019-12-31 DIAGNOSIS — S0125XA Open bite of nose, initial encounter: Secondary | ICD-10-CM | POA: Diagnosis not present

## 2019-12-31 DIAGNOSIS — S0181XA Laceration without foreign body of other part of head, initial encounter: Secondary | ICD-10-CM | POA: Diagnosis not present

## 2019-12-31 DIAGNOSIS — S0121XA Laceration without foreign body of nose, initial encounter: Secondary | ICD-10-CM | POA: Diagnosis not present

## 2020-01-06 ENCOUNTER — Ambulatory Visit (HOSPITAL_COMMUNITY)
Admission: EM | Admit: 2020-01-06 | Discharge: 2020-01-06 | Disposition: A | Discharge status: Home / Self Care | Payer: Medicaid Other | Attending: Family Medicine | Admitting: Family Medicine

## 2020-01-06 ENCOUNTER — Encounter (HOSPITAL_COMMUNITY): Payer: Self-pay

## 2020-01-06 DIAGNOSIS — Z5189 Encounter for other specified aftercare: Secondary | ICD-10-CM | POA: Diagnosis not present

## 2020-01-06 DIAGNOSIS — Z4889 Encounter for other specified surgical aftercare: Secondary | ICD-10-CM

## 2020-01-06 NOTE — ED Triage Notes (Signed)
Pt states she was attacked by a dog on 01/01/20 and had suture/wound closure to multiple facial wounds performed by plastic surgeon in [place] on same day. Pt here today concerned for possible wound infection to nose and lip 2/2 observing "yellow drainage" for past two days. Pt states she has been using hydrogen peroxide to clean wounds twice per day. Denies fever, chills, n/v.  Suture line to nose with well approximated borders; redness noted to lip suture area.  Pt states dog is still under quarantine for rabies symptom observation.

## 2020-01-06 NOTE — ED Provider Notes (Addendum)
[facility]    CSN: [id number] Arrival date & time: 01/06/20  1006      History   Chief Complaint Chief Complaint  Patient presents with  . Wound Check    HPI Sharon Steele is a 19 y.o. female.   Patient presents today for a wound check.  On August 11 she was bit by a pit bull suffering a laceration on her forehead nose and lip.  Plastic surgery was consulted.  After irrigation and debridement wounds were closed with absorbable suture and she presents today for follow-up wound care.  Rabies status of the dog was unknown at the time, however that the dog is in quarantine and being observed.  HPI  History reviewed. No pertinent past medical history.  There are no problems to display for this patient.   History reviewed. No pertinent surgical history.  OB History   No obstetric history on file.      Home Medications    Prior to Admission medications   Medication Sig Start Date End Date Taking? Authorizing Provider  amoxicillin-clavulanate (AUGMENTIN) 875-125 MG tablet Take by mouth. 12/31/19 01/07/20 Yes [provider]  ibuprofen (ADVIL) 800 MG tablet Take 1 tablet (800 mg total) by mouth 3 (three) times daily with meals. 11/24/19   Mardella Layman, MD  metroNIDAZOLE (FLAGYL) 500 MG tablet Take 1 tablet (500 mg total) by mouth 2 (two) times daily. 11/26/19   Anders Simmonds, PA-C  valACYclovir (VALTREX) 1000 MG tablet Take 1,000 mg by mouth daily. 09/13/19   [provider]  Vitamin D, Ergocalciferol, (DRISDOL) 1.25 MG (50000 UNIT) CAPS capsule Take 1 capsule (50,000 Units total) by mouth every 7 (seven) days. 10/30/19   Anders Simmonds, PA-C  cetirizine (ZYRTEC) 10 MG tablet Take 1 tablet (10 mg total) by mouth daily. For 3 days 09/06/16 11/24/19  Ree Shay, MD  ipratropium (ATROVENT) 0.06 % nasal spray Place 2 sprays into both nostrils 4 (four) times daily. Patient not taking: Reported on 09/06/2016 07/04/16 11/24/19  Deatra Canter, FNP     Family History Family History  Problem Relation Age of Onset  . Cancer Mother   . Hypertension Father     Social History Social History   Tobacco Use  . Smoking status: Never Smoker  . Smokeless tobacco: Never Used  Vaping Use  . Vaping Use: Never used  Substance Use Topics  . Alcohol use: Yes  . Drug use: Not Currently     Allergies   Cashew nut oil and Almond oil   Review of Systems Review of Systems  Skin: Positive for wound.       Several wounds on face forehead nose and lip  All other systems reviewed and are negative.    Physical Exam Triage Vital Signs ED Triage Vitals  Enc Vitals Group     BP 01/06/20 1037 122/70     Pulse Rate 01/06/20 1037 93     Resp 01/06/20 1037 18     Temp 01/06/20 1037 98.3 F (36.8 C)     Temp Source 01/06/20 1037 Oral     SpO2 01/06/20 1037 100 %     Weight --      Height --      Head Circumference --      Peak Flow --      Pain Score 01/06/20 1034 3     Pain Loc --      Pain Edu? --      Excl.  in GC? --    No data found.  Updated Vital Signs BP 122/70 (BP Location: Right Arm)   Pulse 93   Temp 98.3 F (36.8 C) (Oral)   Resp 18   LMP 12/10/2019   SpO2 100%   Visual Acuity Right Eye Distance:   Left Eye Distance:   Bilateral Distance:    Right Eye Near:   Left Eye Near:    Bilateral Near:     Physical Exam Vitals and nursing note reviewed.  Constitutional:      Appearance: Normal appearance.  Skin:    Comments: Wounds appear to be healing nicely.  There is scab but no signs of erythema or infection. Patient has been using peroxide as well as an antibiotic ointment      UC Treatments / Results  Labs (all labs ordered are listed, but only abnormal results are displayed) Labs Reviewed - No data to display  EKG   Radiology No results found.  Procedures Procedures (including critical care time)  Medications Ordered in UC Medications - No data to display  Initial Impression /  Assessment and Plan / UC Course  I have reviewed the triage vital signs and the nursing notes.  Pertinent labs & imaging results that were available during my care of the patient were reviewed by me and considered in my medical decision making (see chart for details).     Wound check, appears to be healing well.  No need for further intervention.  Rabies prophylaxis was discussed and as long as the dog can be kept in quarantine for 10 days and does not develop symptoms she will not need rabies prophylaxis. Final Clinical Impressions(s) / UC Diagnoses   Final diagnoses:  None   Discharge Instructions   None    ED Prescriptions    None     PDMP not reviewed this encounter.   Frederica Kuster, MD 01/06/20 1108    Frederica Kuster, MD 01/06/20 1110

## 2020-01-21 DIAGNOSIS — Z419 Encounter for procedure for purposes other than remedying health state, unspecified: Secondary | ICD-10-CM | POA: Diagnosis not present

## 2020-02-20 DIAGNOSIS — Z419 Encounter for procedure for purposes other than remedying health state, unspecified: Secondary | ICD-10-CM | POA: Diagnosis not present

## 2020-03-14 DIAGNOSIS — R21 Rash and other nonspecific skin eruption: Secondary | ICD-10-CM | POA: Diagnosis not present

## 2020-03-14 DIAGNOSIS — R2241 Localized swelling, mass and lump, right lower limb: Secondary | ICD-10-CM | POA: Diagnosis not present

## 2020-03-18 ENCOUNTER — Other Ambulatory Visit: Payer: Self-pay

## 2020-03-18 ENCOUNTER — Ambulatory Visit (INDEPENDENT_AMBULATORY_CARE_PROVIDER_SITE_OTHER): Payer: Medicaid Other | Admitting: Family Medicine

## 2020-03-18 ENCOUNTER — Encounter: Payer: Self-pay | Admitting: Family Medicine

## 2020-03-18 VITALS — BP 137/87 | HR 85 | Ht 60.0 in | Wt 165.0 lb

## 2020-03-18 DIAGNOSIS — N920 Excessive and frequent menstruation with regular cycle: Secondary | ICD-10-CM

## 2020-03-18 DIAGNOSIS — A6004 Herpesviral vulvovaginitis: Secondary | ICD-10-CM

## 2020-03-18 DIAGNOSIS — L732 Hidradenitis suppurativa: Secondary | ICD-10-CM

## 2020-03-18 MED ORDER — NORGESTIMATE-ETH ESTRADIOL 0.25-35 MG-MCG PO TABS: 1.0000 | ORAL_TABLET | Freq: Every day | ORAL | 3 refills | Status: DC

## 2020-03-18 MED ORDER — VALACYCLOVIR HCL 1 G PO TABS: 1000.0000 mg | ORAL_TABLET | Freq: Every day | ORAL | 3 refills | Status: DC

## 2020-03-18 NOTE — Progress Notes (Signed)
Now having heavy and painful periods since coming [facility] in May   Needs refill on valtrex for supression   Discuss birth control options, other than depo

## 2020-03-18 NOTE — Assessment & Plan Note (Signed)
Dial soap--could try Hibiclens and derm referral

## 2020-03-18 NOTE — Patient Instructions (Signed)
Menorrhagia Menorrhagia is when your menstrual periods are heavy or last longer than normal. Follow these instructions at home: Medicines   Take over-the-counter and prescription medicines exactly as told by your doctor. This includes iron pills.  Do not change or switch medicines without asking your doctor.  Do not take aspirin or medicines that contain aspirin 1 week before or during your period. Aspirin may make bleeding worse. General instructions  If you need to change your pad or tampon more than once every 2 hours, limit your activity until the bleeding stops.  Iron pills can cause problems when pooping (constipation). To prevent or treat pooping problems while taking prescription iron pills, your doctor may suggest that you: ? Drink enough fluid to keep your pee (urine) clear or pale yellow. ? Take over-the-counter or prescription medicines. ? Eat foods that are high in fiber. These foods include:  Fresh fruits and vegetables.  Whole grains.  Beans. ? Limit foods that are high in fat and processed sugars. This includes fried and sweet foods.  Eat healthy meals and foods that are high in iron. Foods that have a lot of iron include: ? Leafy green vegetables. ? Meat. ? Liver. ? Eggs. ? Whole grain breads and cereals.  Do not try to lose weight until your heavy bleeding has stopped and you have normal amounts of iron in your blood. If you need to lose weight, work with your doctor.  Keep all follow-up visits as told by your doctor. This is important. Contact a doctor if:  You soak through a pad or tampon every 1 or 2 hours, and this happens every time you have a period.  You need to use pads and tampons at the same time because you are bleeding so much.  You are taking medicine and you: ? Feel sick to your stomach (nauseous). ? Throw up (vomit). ? Have watery poop (diarrhea).  You have other problems that may be related to the medicine you are taking. Get help  right away if:  You soak through more than a pad or tampon in 1 hour.  You pass clots bigger than 1 inch (2.5 cm) wide.  You feel short of breath.  You feel like your heart is beating too fast.  You feel dizzy or you pass out (faint).  You feel very weak or tired. Summary  Menorrhagia is when your menstrual periods are heavy or last longer than normal.  Take over-the-counter and prescription medicines exactly as told by your doctor. This includes iron pills.  Contact a doctor if you soak through more than a pad or tampon in 1 hour or are passing large clots. This information is not intended to replace advice given to you by your health care provider. Make sure you discuss any questions you have with your health care provider. Document Revised: 08/15/2017 Document Reviewed: 05/29/2016 Elsevier Patient Education  2020 Elsevier Inc.  

## 2020-03-18 NOTE — Progress Notes (Signed)
    Subjective:    Patient ID: Sharon Steele is a 19 y.o. female presenting with Menstrual Problem  on 03/18/2020  HPI: On Depo for 4 years. Gained 40 lbs. No cycles. Stopped 11/20 and no cycle unil 5/21. Regular now but with heavy cramping and painful cycles. Also reports painful intercourse. Concerned she may have an ovarian cyst or fibroids. Also wants referral to dermatology. Has recurrent boils or cysts in vaginal area and under breasts but not in axilla.  Review of Systems  Constitutional: Negative for chills and fever.  Respiratory: Negative for shortness of breath.   Cardiovascular: Negative for chest pain.  Gastrointestinal: Negative for abdominal pain, nausea and vomiting.  Genitourinary: Negative for dysuria.  Skin: Negative for rash.      Objective:    BP 137/87   Pulse 85   Ht 5' (1.524 m)   Wt 165 lb (74.8 kg)   LMP 03/12/2020 (Exact Date)   BMI 32.22 kg/m  Physical Exam Constitutional:      General: She is not in acute distress.    Appearance: She is well-developed.  HENT:     Head: Normocephalic and atraumatic.  Eyes:     General: No scleral icterus. Cardiovascular:     Rate and Rhythm: Normal rate.  Pulmonary:     Effort: Pulmonary effort is normal.  Abdominal:     Palpations: Abdomen is soft.  Musculoskeletal:     Cervical back: Neck supple.  Skin:    General: Skin is warm and dry.  Neurological:     Mental Status: She is alert and oriented to person, place, and time.         Assessment & Plan:   Problem List Items Addressed This Visit      Unprioritized   Menorrhagia with regular cycle - Primary    Trial of OC's. She expressed interest in seeing if she might have endometriosis. Discussed that this is found by surgery. Will check pelvic sono to r/o any other pathology--will check ovaries and uterus.      Relevant Medications   norgestimate-ethinyl estradiol (ORTHO-CYCLEN) 0.25-35 MG-MCG tablet   Other Relevant Orders   US PELVIC  COMPLETE WITH TRANSVAGINAL   Herpes simplex vulvovaginitis    Valtrex ppx      Relevant Medications   valACYclovir (VALTREX) 1000 MG tablet   Hidradenitis    Dial soap--could try Hibiclens and derm referral      Relevant Orders   Ambulatory referral to Dermatology      Total time in review of prior notes, pathology, labs, history taking, review with patient, exam, note writing, discussion of options, plan for next steps, alternatives and risks of treatment: 46 minutes.  Return in about 3 months (around 06/18/2020).  [name] 03/18/2020 4:06 PM

## 2020-03-18 NOTE — Assessment & Plan Note (Signed)
Trial of OC's. She expressed interest in seeing if she might have endometriosis. Discussed that this is found by surgery. Will check pelvic sono to r/o any other pathology--will check ovaries and uterus.

## 2020-03-18 NOTE — Assessment & Plan Note (Signed)
Valtrex ppx

## 2020-03-22 DIAGNOSIS — Z419 Encounter for procedure for purposes other than remedying health state, unspecified: Secondary | ICD-10-CM | POA: Diagnosis not present

## 2020-03-30 ENCOUNTER — Ambulatory Visit
Admission: RE | Admit: 2020-03-30 | Discharge: 2020-03-30 | Disposition: A | Discharge status: Home / Self Care | Payer: Medicaid Other | Source: Ambulatory Visit | Attending: Family Medicine | Admitting: Family Medicine

## 2020-03-30 ENCOUNTER — Other Ambulatory Visit: Payer: Self-pay

## 2020-03-30 DIAGNOSIS — N920 Excessive and frequent menstruation with regular cycle: Secondary | ICD-10-CM | POA: Insufficient documentation

## 2020-04-21 DIAGNOSIS — Z419 Encounter for procedure for purposes other than remedying health state, unspecified: Secondary | ICD-10-CM | POA: Diagnosis not present

## 2020-05-22 DIAGNOSIS — Z419 Encounter for procedure for purposes other than remedying health state, unspecified: Secondary | ICD-10-CM | POA: Diagnosis not present

## 2020-05-24 ENCOUNTER — Other Ambulatory Visit: Payer: Self-pay

## 2020-05-24 ENCOUNTER — Emergency Department (HOSPITAL_COMMUNITY)
Admission: EM | Admit: 2020-05-24 | Discharge: 2020-05-24 | Disposition: A | Discharge status: Home / Self Care | Payer: Medicaid Other | Attending: Emergency Medicine | Admitting: Emergency Medicine

## 2020-05-24 ENCOUNTER — Emergency Department (HOSPITAL_COMMUNITY): Payer: Medicaid Other

## 2020-05-24 DIAGNOSIS — M79644 Pain in right finger(s): Secondary | ICD-10-CM | POA: Insufficient documentation

## 2020-05-24 DIAGNOSIS — Z20822 Contact with and (suspected) exposure to covid-19: Secondary | ICD-10-CM | POA: Diagnosis not present

## 2020-05-24 DIAGNOSIS — S6991XA Unspecified injury of right wrist, hand and finger(s), initial encounter: Secondary | ICD-10-CM | POA: Diagnosis not present

## 2020-05-24 LAB — SARS CORONAVIRUS 2 (TAT 6-24 HRS): SARS Coronavirus 2: NEGATIVE

## 2020-05-24 MED ORDER — HYDROCODONE-ACETAMINOPHEN 5-325 MG PO TABS
2.0000 | ORAL_TABLET | Freq: Once | ORAL | Status: AC
  Administered 2020-05-24: 2 via ORAL
  Filled 2020-05-24: qty 2

## 2020-05-24 NOTE — ED Provider Notes (Signed)
[facility] EMERGENCY DEPARTMENT Provider Note   CSN: [id number] Arrival date & time: 05/24/20  0151     History Chief Complaint  Patient presents with  . Hand Pain    Sharon Steele is a 20 y.o. left hand dominant female with noncontributory past medical history.  HPI Patient presents to emergency department today with chief complaint of right hand 5th finger pain onset just prior to arrival. Patient states her finger was accidentally closed in a car door. She had sudden onset of pain localized to finger. Pain does not radiate. Pain is constant. No medications for symptoms prior to arrival. She rates the pain 10 out of 10 in severity. She states pain is worse with movement. No open wounds. She states the tip of her finger turned red immediately. Denies any numbness, tingling, weakness.    Past Medical History:  Diagnosis Date  . HSV (herpes simplex virus) infection     Patient Active Problem List   Diagnosis Date Noted  . Menorrhagia with regular cycle 03/18/2020  . 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 Hidradenitis 03/18/2020    No past surgical history on file.   OB History    Gravida  0   Para  0   Term  0   Preterm  0   AB  0   Living  0     SAB  0   IAB  0   Ectopic  0   Multiple  0   Live Births  0           Family History  Problem Relation Age of Onset  . Cancer Mother   . Hypertension Father   . Hypertension Paternal Grandfather   . Hypertension Paternal Grandmother   . Diabetes Maternal Grandmother   . Diabetes Maternal Grandfather   . Hypertension Paternal Aunt   . Multiple sclerosis Paternal Aunt     Social History   Tobacco Use  . Smoking status: Never Smoker  . Smokeless tobacco: Never Used  Vaping Use  . Vaping Use: Never used  Substance Use Topics  . Alcohol use: Yes  . Drug use: Not Currently    Home Medications Prior to Admission medications   Medication Sig Start Date End Date Taking?  Authorizing Provider  ibuprofen (ADVIL) 800 MG tablet Take 1 tablet (800 mg total) by mouth 3 (three) times daily with meals. 11/24/19   Mardella Layman, MD  norgestimate-ethinyl estradiol (ORTHO-CYCLEN) 0.25-35 MG-MCG tablet Take 1 tablet by mouth daily. 03/18/20   [name], MD  valACYclovir (VALTREX) 1000 MG tablet Take 1 tablet (1,000 mg total) by mouth daily. 03/18/20   [name], MD  Vitamin D, Ergocalciferol, (DRISDOL) 1.25 MG (50000 UNIT) CAPS capsule Take 1 capsule (50,000 Units total) by mouth every 7 (seven) days. 10/30/19   Anders Simmonds, PA-C  cetirizine (ZYRTEC) 10 MG tablet Take 1 tablet (10 mg total) by mouth daily. For 3 days 09/06/16 11/24/19  Ree Shay, MD  ipratropium (ATROVENT) 0.06 % nasal spray Place 2 sprays into both nostrils 4 (four) times daily. Patient not taking: Reported on 09/06/2016 07/04/16 11/24/19  Deatra Canter, FNP    Allergies    Cashew nut oil and Almond oil  Review of Systems   Review of Systems All other systems are reviewed and are negative for acute change except as noted in the HPI.  Physical Exam Updated Vital Signs BP 133/71   Pulse 79   Temp 98.5  F (36.9 C) (Oral)   Resp 16   SpO2 96%   Physical Exam Vitals and nursing note reviewed.  Constitutional:      Appearance: She is well-developed. She is not ill-appearing or toxic-appearing.  HENT:     Head: Normocephalic and atraumatic.     Nose: Nose normal.  Eyes:     General: No scleral icterus.       Right eye: No discharge.        Left eye: No discharge.     Conjunctiva/sclera: Conjunctivae normal.  Neck:     Vascular: No JVD.  Cardiovascular:     Rate and Rhythm: Normal rate and regular rhythm.     Pulses: Normal pulses.     Heart sounds: Normal heart sounds.  Pulmonary:     Effort: Pulmonary effort is normal.     Breath sounds: Normal breath sounds.  Abdominal:     General: There is no distension.  Musculoskeletal:        General: Normal range of motion.      Cervical back: Normal range of motion.     Comments: No deformity noted to right fifth finger.  Neurovascularly intact distally.  No open wounds.  No injury to the nail, nail is very much intact.  Tender to palpation of DIP with decreased range of motion secondary to pain.  Skin:    General: Skin is warm and dry.     Capillary Refill: Capillary refill takes less than 2 seconds.  Neurological:     Mental Status: She is oriented to person, place, and time.     GCS: GCS eye subscore is 4. GCS verbal subscore is 5. GCS motor subscore is 6.     Comments: Fluent speech, no facial droop.  Psychiatric:        Behavior: Behavior normal.     ED Results / Procedures / Treatments   Labs (all labs ordered are listed, but only abnormal results are displayed) Labs Reviewed  SARS CORONAVIRUS 2 (TAT 6-24 HRS)    EKG None  Radiology DG Finger Little Right  Result Date: 05/24/2020 CLINICAL DATA:  Right little finger injury, pain EXAM: RIGHT LITTLE FINGER 2+V COMPARISON:  None. FINDINGS: There is no evidence of fracture or dislocation. There is no evidence of arthropathy or other focal bone abnormality. Soft tissues are unremarkable. IMPRESSION: Negative. Electronically Signed   By: Helyn Numbers MD   On: 05/24/2020 02:32    Procedures Procedures (including critical care time)  Medications Ordered in ED Medications  HYDROcodone-acetaminophen (NORCO/VICODIN) 5-325 MG per tablet 2 tablet (2 tablets Oral Given 05/24/20 1610)    ED Course  I have reviewed the triage vital signs and the nursing notes.  Pertinent labs & imaging results that were available during my care of the patient were reviewed by me and considered in my medical decision making (see chart for details).    MDM Rules/Calculators/A&P                         History provided by patient with additional history obtained from chart review.    Patient presents to the ED with complaints of pain to the right 5th finger s/p injury  closing in car door. Exam without obvious deformity or open wounds. ROM decreased secondary to pain intact. Tender to palpation of DIP. No injury to nail. NVI distally. Xray viewed by me is negative for fracture/dislocation. Therapeutic splint provided. Denies chance of pregnancy, politely  refuses pregnancy test. Ice applied and dose of pain medicine in the ED. Recommend tylenol and ibuprofen at home for pain. I discussed results, treatment plan, need for follow-up, and return precautions with the patient. Provided opportunity for questions, patient confirmed understanding and are in agreement with plan. She is requesting covid test at discharge. She is aware results will be available in MyChart. Discussed quarantine recommendations if positive.   Portions of this note were generated with Lobbyist. Dictation errors may occur despite best attempts at proofreading.   Final Clinical Impression(s) / ED Diagnoses Final diagnoses:  Finger pain, right    Rx / DC Orders ED Discharge Orders    None       Lewanda Rife 05/24/20 8832    Breck Coons, MD 05/24/20 1122

## 2020-05-24 NOTE — Discharge Instructions (Addendum)
The xray did not show any broken bones.  -Your Covid and flu test results will be available in 6 to 24 hours.  If you are positive you will receive a phone call.  Your results will be available for you to see in your MyChart.  You likely have a bruise to your finger which will still cause pain. The redness should go away over time.  Take tylenol and ibuprofen for pain as directed on he bottle.   Apply ice for your pain and elevate your hand if you have throbbing in your finger.  Follow up with primary care doctor in 2-5 days for symptom recheck.

## 2020-05-24 NOTE — ED Triage Notes (Signed)
Pt presents to ED POV. Pt c/o pain in her R pinky. Pt reports that she slammed it in a car door. No open wounds.

## 2020-05-25 ENCOUNTER — Other Ambulatory Visit: Payer: Self-pay

## 2020-05-25 ENCOUNTER — Ambulatory Visit (HOSPITAL_COMMUNITY)
Admission: EM | Admit: 2020-05-25 | Discharge: 2020-05-25 | Disposition: A | Discharge status: Home / Self Care | Payer: Medicaid Other | Attending: Student | Admitting: Student

## 2020-05-25 ENCOUNTER — Telehealth: Payer: Self-pay

## 2020-05-25 ENCOUNTER — Encounter (HOSPITAL_COMMUNITY): Payer: Self-pay

## 2020-05-25 DIAGNOSIS — R110 Nausea: Secondary | ICD-10-CM | POA: Diagnosis not present

## 2020-05-25 DIAGNOSIS — Z20822 Contact with and (suspected) exposure to covid-19: Secondary | ICD-10-CM | POA: Insufficient documentation

## 2020-05-25 LAB — CBC
HCT: 35.8 % — ABNORMAL LOW (ref 36.0–46.0)
Hemoglobin: 12.3 g/dL (ref 12.0–15.0)
MCH: 30.8 pg (ref 26.0–34.0)
MCHC: 34.4 g/dL (ref 30.0–36.0)
MCV: 89.5 fL (ref 80.0–100.0)
Platelets: 236 10*3/uL (ref 150–400)
RBC: 4 MIL/uL (ref 3.87–5.11)
RDW: 12.4 % (ref 11.5–15.5)
WBC: 5.4 10*3/uL (ref 4.0–10.5)
nRBC: 0 % (ref 0.0–0.2)

## 2020-05-25 LAB — COMPREHENSIVE METABOLIC PANEL
ALT: 17 U/L (ref 0–44)
AST: 19 U/L (ref 15–41)
Albumin: 4 g/dL (ref 3.5–5.0)
Alkaline Phosphatase: 47 U/L (ref 38–126)
Anion gap: 9 (ref 5–15)
BUN: 9 mg/dL (ref 6–20)
CO2: 24 mmol/L (ref 22–32)
Calcium: 9.4 mg/dL (ref 8.9–10.3)
Chloride: 104 mmol/L (ref 98–111)
Creatinine, Ser: 0.87 mg/dL (ref 0.44–1.00)
GFR, Estimated: >60 mL/min (ref >60)
Glucose, Bld: 96 mg/dL (ref 70–99)
Potassium: 4 mmol/L (ref 3.5–5.1)
Sodium: 137 mmol/L (ref 135–145)
Total Bilirubin: 0.8 mg/dL (ref 0.3–1.2)
Total Protein: 7.5 g/dL (ref 6.5–8.1)

## 2020-05-25 LAB — RESP PANEL BY RT-PCR (FLU A&B, COVID) ARPGX2
Influenza A by PCR: NEGATIVE
Influenza B by PCR: NEGATIVE
SARS Coronavirus 2 by RT PCR: NEGATIVE

## 2020-05-25 MED ORDER — ONDANSETRON HCL 8 MG PO TABS: 8.0000 mg | ORAL_TABLET | Freq: Three times a day (TID) | ORAL | 0 refills | Status: DC | PRN

## 2020-05-25 NOTE — Telephone Encounter (Signed)
Transition of Care Contact from Inpatient Facility  Date of Discharge: 05/25/20 Date of Contact: 05/25/20 Method of contact: phone - attempted  Attempted to contact patient to discuss transition of care from inpatient admission.  Patient did not answer the phone.  Message was left on patient's voicemail informing them we would attempt to call them again and if unable to reach will follow up at dialysis.  Virgina Norfolk, PA-C [facility] Pager: [phone]

## 2020-05-25 NOTE — ED Triage Notes (Signed)
Pt reports she felt lightheadedness when woke up this morning. States having nausea and dry throat since this morning. Denies abdominal pain, fever, diarrhea.   Pt reports she had a negative COVID test yesterday.

## 2020-05-25 NOTE — Discharge Instructions (Addendum)
-  Zofran up to 3x daily for nausea.  -For fevers/chills, body aches, headaches- use Tylenol and Ibuprofen. You can alternate these for maximum effect. Use up to 3000mg  Tylenol daily and 3200mg  Ibuprofen daily. Make sure to take ibuprofen with food. Check the bottle of ibuprofen/tylenol for specific dosage instructions. -We'll call you if any of your labwork is abnormal. This typically takes 1-2 days to come back.

## 2020-05-25 NOTE — ED Provider Notes (Signed)
[facility]    CSN: [id number] Arrival date & time: 05/25/20  1401      History   Chief Complaint Chief Complaint  Patient presents with  . Dizziness  . Nausea    HPI Sharon Steele is a 20 y.o. female presenting for dizziness and nausea. History of menorrhagia, HSV, Hidradenitis. Negative covid-19 test 1 day ago at ER. She was at the ER 1 day ago due to finger injury and notes she was given pain pills that exacerbated her nausea. Denies history of anemia though she did recently start getting her period again after getting off depo. Denies ear pain, hearing changes, tinnitus, headaches. Endorses nausea, dry throat. Denies abdominal pain, fever, diarrhea. States she could not be pregnant as she hasn't been sexually active in 5 months. Denies chest pain, shortness of breath. Denies diarrhea, abd pain, denies vomiting.  Denies fevers/chills, v/d, shortness of breath, chest pain, cough, congestion, facial pain, teeth pain, headaches, sore throat, loss of taste/smell, swollen lymph nodes, ear pain.  Denies chest pain, shortness of breath, confusion, high fevers.     HPI  Past Medical History:  Diagnosis Date  . HSV (herpes simplex virus) infection     Patient Active Problem List   Diagnosis Date Noted  . Menorrhagia with regular cycle 03/18/2020  . 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 Hidradenitis 03/18/2020    History reviewed. No pertinent surgical history.  OB History    Gravida  0   Para  0   Term  0   Preterm  0   AB  0   Living  0     SAB  0   IAB  0   Ectopic  0   Multiple  0   Live Births  0            Home Medications    Prior to Admission medications   Medication Sig Start Date End Date Taking? Authorizing Provider  ondansetron (ZOFRAN) 8 MG tablet Take 1 tablet (8 mg total) by mouth every 8 (eight) hours as needed for nausea or vomiting. 05/25/20  Yes Rhys Martini, PA-C  ibuprofen (ADVIL) 800 MG tablet Take 1 tablet  (800 mg total) by mouth 3 (three) times daily with meals. 11/24/19   Mardella Layman, MD  norgestimate-ethinyl estradiol (ORTHO-CYCLEN) 0.25-35 MG-MCG tablet Take 1 tablet by mouth daily. 03/18/20   [name], MD  valACYclovir (VALTREX) 1000 MG tablet Take 1 tablet (1,000 mg total) by mouth daily. 03/18/20   [name], MD  Vitamin D, Ergocalciferol, (DRISDOL) 1.25 MG (50000 UNIT) CAPS capsule Take 1 capsule (50,000 Units total) by mouth every 7 (seven) days. 10/30/19   Anders Simmonds, PA-C  cetirizine (ZYRTEC) 10 MG tablet Take 1 tablet (10 mg total) by mouth daily. For 3 days 09/06/16 11/24/19  Ree Shay, MD  ipratropium (ATROVENT) 0.06 % nasal spray Place 2 sprays into both nostrils 4 (four) times daily. Patient not taking: Reported on 09/06/2016 07/04/16 11/24/19  Deatra Canter, FNP    Family History Family History  Problem Relation Age of Onset  . Cancer Mother   . Hypertension Father   . Hypertension Paternal Grandfather   . Hypertension Paternal Grandmother   . Diabetes Maternal Grandmother   . Diabetes Maternal Grandfather   . Hypertension Paternal Aunt   . Multiple sclerosis Paternal Aunt     Social History Social History   Tobacco Use  . Smoking status: Never Smoker  .  Smokeless tobacco: Never Used  Vaping Use  . Vaping Use: Never used  Substance Use Topics  . Alcohol use: Yes  . Drug use: Not Currently     Allergies   Cashew nut oil and Almond oil   Review of Systems Review of Systems  Constitutional: Negative for appetite change, chills and fever.  HENT: Negative for congestion, ear pain, rhinorrhea, sinus pressure, sinus pain and sore throat.   Eyes: Negative for redness and visual disturbance.  Respiratory: Negative for cough, chest tightness, shortness of breath and wheezing.   Cardiovascular: Negative for chest pain and palpitations.  Gastrointestinal: Positive for nausea. Negative for abdominal pain, constipation, diarrhea and vomiting.   Genitourinary: Negative for dysuria, frequency and urgency.  Musculoskeletal: Negative for myalgias.  Neurological: Positive for dizziness. Negative for weakness and headaches.  Psychiatric/Behavioral: Negative for confusion.  All other systems reviewed and are negative.    Physical Exam Triage Vital Signs ED Triage Vitals  Enc Vitals Group     BP 05/25/20 1557 132/83     Pulse Rate 05/25/20 1557 98     Resp 05/25/20 1557 18     Temp 05/25/20 1557 99.8 F (37.7 C)     Temp Source 05/25/20 1557 Oral     SpO2 05/25/20 1557 98 %     Weight --      Height --      Head Circumference --      Peak Flow --      Pain Score 05/25/20 1556 0     Pain Loc --      Pain Edu? --      Excl. in GC? --    No data found.  Updated Vital Signs BP 132/83 (BP Location: Left Arm)   Pulse 98   Temp 99.8 F (37.7 C) (Oral)   Resp 18   LMP 05/13/2020 (Exact Date)   SpO2 98%   Visual Acuity Right Eye Distance:   Left Eye Distance:   Bilateral Distance:    Right Eye Near:   Left Eye Near:    Bilateral Near:     Physical Exam Vitals reviewed.  Constitutional:      General: She is not in acute distress.    Appearance: Normal appearance. She is not ill-appearing.  HENT:     Head: Normocephalic and atraumatic.     Right Ear: Hearing, tympanic membrane, ear canal and external ear normal. No swelling or tenderness. There is no impacted cerumen. No mastoid tenderness. Tympanic membrane is not perforated, erythematous, retracted or bulging.     Left Ear: Hearing, tympanic membrane, ear canal and external ear normal. No swelling or tenderness. There is no impacted cerumen. No mastoid tenderness. Tympanic membrane is not perforated, erythematous, retracted or bulging.     Nose:     Right Sinus: No maxillary sinus tenderness or frontal sinus tenderness.     Left Sinus: No maxillary sinus tenderness or frontal sinus tenderness.     Mouth/Throat:     Mouth: Mucous membranes are moist.      Pharynx: Uvula midline. No oropharyngeal exudate or posterior oropharyngeal erythema.     Tonsils: No tonsillar exudate.  Cardiovascular:     Rate and Rhythm: Normal rate and regular rhythm.     Heart sounds: Normal heart sounds.  Pulmonary:     Breath sounds: Normal breath sounds and air entry. No wheezing, rhonchi or rales.  Chest:     Chest wall: No tenderness.  Abdominal:  General: Abdomen is flat. Bowel sounds are normal.     Tenderness: There is abdominal tenderness in the epigastric area. There is no right CVA tenderness, left CVA tenderness, guarding or rebound. Negative signs include Murphy's sign, Rovsing's sign and McBurney's sign.  Lymphadenopathy:     Cervical: No cervical adenopathy.  Neurological:     General: No focal deficit present.     Mental Status: She is alert and oriented to person, place, and time.  Psychiatric:        Attention and Perception: Attention and perception normal.        Mood and Affect: Mood and affect normal.        Behavior: Behavior normal. Behavior is cooperative.        Thought Content: Thought content normal.        Judgment: Judgment normal.      UC Treatments / Results  Labs (all labs ordered are listed, but only abnormal results are displayed) Labs Reviewed  RESP PANEL BY RT-PCR (FLU A&B, COVID) ARPGX2  CBC  COMPREHENSIVE METABOLIC PANEL    EKG   Radiology DG Finger Little Right  Result Date: 05/24/2020 CLINICAL DATA:  Right little finger injury, pain EXAM: RIGHT LITTLE FINGER 2+V COMPARISON:  None. FINDINGS: There is no evidence of fracture or dislocation. There is no evidence of arthropathy or other focal bone abnormality. Soft tissues are unremarkable. IMPRESSION: Negative. Electronically Signed   By: Fidela Salisbury MD   On: 05/24/2020 02:32    Procedures Procedures (including critical care time)  Medications Ordered in UC Medications - No data to display  Initial Impression / Assessment and Plan / UC Course  I  have reviewed the triage vital signs and the nursing notes.  Pertinent labs & imaging results that were available during my care of the patient were reviewed by me and considered in my medical decision making (see chart for details).     Glucose wnl (81) 10/2019. Pt denies history of diabetes or sugar issues. She has a history of menorrhagia, though denies anemia; CMP, CBC ordered today.   Covid and influenza tests sent today. Isolation precautions per CDC guidelines until negative result. Symptomatic relief with OTC Mucinex, Nyquil, etc. Return precautions- new/worsening fevers/chills, shortness of breath, chest pain, abd pain, etc. Zofran as below for nausea.     Final Clinical Impressions(s) / UC Diagnoses   Final diagnoses:  Nausea     Discharge Instructions     -Zofran up to 3x daily for nausea.  -For fevers/chills, body aches, headaches- use Tylenol and Ibuprofen. You can alternate these for maximum effect. Use up to 3000mg  Tylenol daily and 3200mg  Ibuprofen daily. Make sure to take ibuprofen with food. Check the bottle of ibuprofen/tylenol for specific dosage instructions. -We'll call you if any of your labwork is abnormal. This typically takes 1-2 days to come back.     ED Prescriptions    Medication Sig Dispense Auth. Provider   ondansetron (ZOFRAN) 8 MG tablet Take 1 tablet (8 mg total) by mouth every 8 (eight) hours as needed for nausea or vomiting. 20 tablet Hazel Sams, PA-C     PDMP not reviewed this encounter.   Hazel Sams, PA-C 05/25/20 1642

## 2020-05-31 ENCOUNTER — Other Ambulatory Visit: Payer: Self-pay | Admitting: Family Medicine

## 2020-05-31 DIAGNOSIS — N920 Excessive and frequent menstruation with regular cycle: Secondary | ICD-10-CM

## 2020-05-31 MED ORDER — NORGESTIMATE-ETH ESTRADIOL 0.25-35 MG-MCG PO TABS: 1.0000 | ORAL_TABLET | Freq: Every day | ORAL | 0 refills | Status: DC

## 2020-06-10 ENCOUNTER — Other Ambulatory Visit: Payer: Self-pay

## 2020-06-10 ENCOUNTER — Ambulatory Visit (INDEPENDENT_AMBULATORY_CARE_PROVIDER_SITE_OTHER): Payer: Medicaid Other | Admitting: Family Medicine

## 2020-06-10 ENCOUNTER — Encounter: Payer: Self-pay | Admitting: Family Medicine

## 2020-06-10 DIAGNOSIS — N920 Excessive and frequent menstruation with regular cycle: Secondary | ICD-10-CM

## 2020-06-10 NOTE — Progress Notes (Signed)
having irregular periods now, would like a UPT and STD screen

## 2020-06-10 NOTE — Progress Notes (Signed)
   Subjective:    Patient ID: Sharon Steele is a 20 y.o. female presenting with No chief complaint on file.  on 06/10/2020  HPI: Has been on OCs x 3 months. Cycles are somewhat irregular. Having multiple cycles but also missing doses. Was on Depo. Also, having some pain, and nausea, when making (taking 2 to catch up) up her OCs.  Review of Systems  Constitutional: Negative for chills and fever.  Respiratory: Negative for shortness of breath.   Cardiovascular: Negative for chest pain.  Gastrointestinal: Negative for abdominal pain, nausea and vomiting.  Genitourinary: Negative for dysuria.  Skin: Negative for rash.      Objective:    BP 127/78   Pulse 82   LMP 05/13/2020 (Exact Date)  Physical Exam Vitals reviewed.  Constitutional:      General: She is not in acute distress.    Appearance: She is well-developed and well-nourished.  HENT:     Head: Normocephalic and atraumatic.  Eyes:     General: No scleral icterus. Cardiovascular:     Rate and Rhythm: Normal rate.  Pulmonary:     Effort: Pulmonary effort is normal.  Abdominal:     Palpations: Abdomen is soft.  Musculoskeletal:     Cervical back: Neck supple.  Skin:    General: Skin is warm and dry.  Neurological:     Mental Status: She is alert and oriented to person, place, and time.  Psychiatric:        Mood and Affect: Mood and affect normal.         Assessment & Plan:   Problem List Items Addressed This Visit      Unprioritized   Menorrhagia with regular cycle    Still with some irregular cycles, but missing doses--may not be right for her--to keep symptom diary and tracking of pill taking. Other alternatives reviewed at length with her last time.         Total time in review of prior notes, pathology, labs, history taking, review with patient, exam, note writing, discussion of options, plan for next steps, alternatives and risks of treatment: 24 minutes.  Return in about 8 weeks (around  08/05/2020), or if symptoms worsen or fail to improve, for a follow-up.  [name] 06/10/2020 4:34 PM

## 2020-06-10 NOTE — Assessment & Plan Note (Signed)
Still with some irregular cycles, but missing doses--may not be right for her--to keep symptom diary and tracking of pill taking. Other alternatives reviewed at length with her last time.

## 2020-06-10 NOTE — Patient Instructions (Signed)
Menorrhagia Menorrhagia is a form of abnormal uterine bleeding in which menstrual periods are heavy or last longer than normal. With menorrhagia, the periods may cause enough blood loss and cramping that a woman becomes unable to take part in her usual activities. What are the causes? Common causes of this condition include:  Polyps or fibroids. These are noncancerous growths in the uterus.  An imbalance of the hormones estrogen and progesterone.  Anovulation, which occurs when one of the ovaries does not release an egg during one or more months.  A problem with the thyroid gland (hypothyroidism).  Side effects of having an intrauterine device (IUD).  Side effects of some medicines, such as NSAIDs or blood thinners.  A bleeding disorder that stops the blood from clotting normally. In some cases, the cause of this condition is not known. What increases the risk? You are more likely to develop this condition if you have cancer of the uterus. What are the signs or symptoms? Symptoms of this condition include:  Routinely having to change your pad or tampon every 1-2 hours because it is soaked.  Needing to use pads and tampons at the same time because of heavy bleeding.  Needing to wake up to change your pads or tampons during the night.  Passing blood clots larger than 1 inch (2.5 cm) in size.  Having bleeding that lasts for more than 7 days.  Having symptoms of low iron levels (anemia), such as tiredness (fatigue) or shortness of breath. How is this diagnosed? This condition may be diagnosed based on:  A physical exam.  Your symptoms and menstrual history.  Tests, such as: ? Blood tests to check if you are pregnant or if you have hormonal changes, a bleeding or thyroid disorder, anemia, or other problems. ? Pap test to check for cancerous changes, infections, or inflammation. ? Endometrial biopsy. This test involves removing a tissue sample from the lining of the uterus  (endometrium) to be examined under a microscope. ? Pelvic ultrasound. This test uses sound waves to create images of your uterus, ovaries, and vagina. The images can show if you have fibroids or other growths. ? Hysteroscopy. For this test, a thin, flexible tube with a light on the end (hysteroscope) is used to look inside your uterus. How is this treated? Treatment may not be needed for this condition. If it is needed, the best treatment for you will depend on:  Whether you need to prevent pregnancy.  Your desire to have children in the future.  The cause and severity of your bleeding.  Your personal preference. Medicine Medicines are the first step in treatment. You may be treated with:  Hormonal birth control methods. These treatments reduce bleeding during your menstrual period. They include: ? Birth control pills. ? Skin patch. ? Vaginal ring. ? Shots (injections) that you get every 3 months. ? Hormonal IUD. ? Implants that go under the skin.  Medicines that thicken the blood and slow bleeding.  Medicines that reduce swelling, such as ibuprofen.  Medicines that contain an artificial (synthetic) hormone called progestin.  Medicines that make the ovaries stop working for a short time.  Iron supplements to treat anemia.   Surgery If medicines do not work, surgery may be done. Surgical options may include:  Dilation and curettage (D&C). In this procedure, your health care provider opens the lowest part of the uterus (cervix) and then scrapes or suctions tissue from the endometrium. This reduces menstrual bleeding.  Operative hysteroscopy. In this procedure,   a hysteroscope is used to view your uterus and help remove polyps that may be causing heavy periods.  Endometrial ablation. This is when various techniques are used to permanently destroy your entire endometrium. After endometrial ablation, most women have little or no menstrual flow. This procedure reduces your ability to  become pregnant.  Endometrial resection. In this procedure, an electrosurgical wire loop is used to remove the endometrium. This procedure reduces your ability to become pregnant.  Hysterectomy. This is surgical removal of your uterus. This is a permanent procedure that stops menstrual periods. Pregnancy is not possible after a hysterectomy. Follow these instructions at home: Medicines  Take over-the-counter and prescription medicines only as told by your health care provider. This includes iron pills.  Do not change or switch medicines without asking your health care provider.  Do not take aspirin or medicines that contain aspirin 1 week before or during your menstrual period. Aspirin may make bleeding worse. Managing constipation Your iron pills may cause constipation. If you are taking prescription iron supplements, you may need to take these actions to prevent or treat constipation:  Drink enough fluid to keep your urine pale yellow.  Take over-the-counter or prescription medicines.  Eat foods that are high in fiber, such as beans, whole grains, and fresh fruits and vegetables.  Limit foods that are high in fat and processed sugars, such as fried or sweet foods. General instructions  If you need to change your sanitary pad or tampon more than once every 2 hours, limit your activity until the bleeding stops.  Eat well-balanced meals, including foods that are high in iron. Foods that have a lot of iron include leafy green vegetables, meat, liver, eggs, and whole-grain breads and cereals.  Do not try to lose weight until the abnormal bleeding has stopped and your blood iron level is back to normal. If you need to lose weight, work with your health care provider to lose weight safely.  Keep all follow-up visits. This is important. Contact a health care provider if:  You soak through a pad or tampon every 1 or 2 hours, and this happens every time you have a period.  You need to use  pads and tampons at the same time because you are bleeding so much.  You have nausea, vomiting, diarrhea, or other problems related to medicines you are taking. Get help right away if:  You soak through more than a pad or tampon in 1 hour.  You pass clots bigger than 1 inch (2.5 cm) wide.  You feel short of breath.  You feel like your heart is beating too fast.  You feel dizzy or you faint.  You feel very weak or tired. Summary  Menorrhagia is a form of abnormal uterine bleeding in which menstrual periods are heavy or last longer than normal.  Treatment may not be needed for this condition. If it is needed, it may include medicines or procedures.  Take over-the-counter and prescription medicines only as told by your health care provider. This includes iron pills.  Get help right away if you have heavy bleeding that soaks through more than a pad or tampon in 1 hour, you pass large clots, or you feel dizzy, short of breath, or very weak or tired. This information is not intended to replace advice given to you by your health care provider. Make sure you discuss any questions you have with your health care provider. Document Revised: 01/20/2020 Document Reviewed: 01/20/2020 Elsevier Patient Education  2021   [employer]  

## 2020-06-18 ENCOUNTER — Other Ambulatory Visit: Payer: Self-pay

## 2020-06-18 DIAGNOSIS — N920 Excessive and frequent menstruation with regular cycle: Secondary | ICD-10-CM

## 2020-06-22 DIAGNOSIS — Z419 Encounter for procedure for purposes other than remedying health state, unspecified: Secondary | ICD-10-CM | POA: Diagnosis not present

## 2020-06-22 MED ORDER — NORGESTIMATE-ETH ESTRADIOL 0.25-35 MG-MCG PO TABS: 1.0000 | ORAL_TABLET | Freq: Every day | ORAL | 12 refills | Status: DC

## 2020-06-24 ENCOUNTER — Ambulatory Visit (INDEPENDENT_AMBULATORY_CARE_PROVIDER_SITE_OTHER): Payer: Medicaid Other | Admitting: *Deleted

## 2020-06-24 ENCOUNTER — Other Ambulatory Visit: Payer: Self-pay

## 2020-06-24 ENCOUNTER — Other Ambulatory Visit (HOSPITAL_COMMUNITY)
Admission: RE | Admit: 2020-06-24 | Discharge: 2020-06-24 | Disposition: A | Discharge status: Home / Self Care | Payer: Medicaid Other | Source: Ambulatory Visit | Attending: Obstetrics and Gynecology | Admitting: Obstetrics and Gynecology

## 2020-06-24 VITALS — BP 123/79 | HR 82

## 2020-06-24 DIAGNOSIS — N898 Other specified noninflammatory disorders of vagina: Secondary | ICD-10-CM

## 2020-06-24 NOTE — Progress Notes (Signed)
SUBJECTIVE:  20 y.o. female complains of vaginal itch and yellow discharge for 1 day(s). Denies abnormal vaginal bleeding or significant pelvic pain or fever. No UTI symptoms. Denies history of known exposure to STD.  No LMP recorded.  OBJECTIVE:  She appears well, afebrile. Urine dipstick: not done.  ASSESSMENT:  Vaginal Discharge  Vaginal Itching    PLAN:  GC, chlamydia, trichomonas, BVAG, CVAG probe sent to lab. Treatment: To be determined once lab results are received ROV prn if symptoms persist or worsen.

## 2020-06-25 LAB — CERVICOVAGINAL ANCILLARY ONLY
Bacterial Vaginitis (gardnerella): NEGATIVE
Candida Glabrata: POSITIVE — AB
Candida Vaginitis: POSITIVE — AB
Chlamydia: NEGATIVE
Comment: NEGATIVE
Comment: NEGATIVE
Comment: NEGATIVE
Comment: NEGATIVE
Comment: NEGATIVE
Comment: NORMAL
Neisseria Gonorrhea: NEGATIVE
Trichomonas: NEGATIVE

## 2020-06-26 ENCOUNTER — Other Ambulatory Visit: Payer: Self-pay | Admitting: Obstetrics and Gynecology

## 2020-06-26 ENCOUNTER — Telehealth: Payer: Medicaid Other | Admitting: Physician Assistant

## 2020-06-26 DIAGNOSIS — B373 Candidiasis of vulva and vagina: Secondary | ICD-10-CM

## 2020-06-26 DIAGNOSIS — B3731 Acute candidiasis of vulva and vagina: Secondary | ICD-10-CM

## 2020-06-26 MED ORDER — MICONAZOLE NITRATE 2 % VA CREA: 1.0000 | TOPICAL_CREAM | Freq: Every day | VAGINAL | 1 refills | Status: AC

## 2020-06-26 MED ORDER — FLUCONAZOLE 150 MG PO TABS: 150.0000 mg | ORAL_TABLET | Freq: Once | ORAL | 0 refills | Status: AC

## 2020-06-26 NOTE — Progress Notes (Signed)
We are sorry that you are not feeling well. Here is how we plan to help! Based on what you shared with me and reviewing your recent visit with GYN and test results, you have a yeast vaginitis.  Vaginosis is an inflammation of the vagina that can result in discharge, itching and pain. The cause is usually a change in the normal balance of vaginal bacteria or an infection. Vaginosis can also result from reduced estrogen levels after menopause.  The most common causes of vaginosis are:   Bacterial vaginosis which results from an overgrowth of one on several organisms that are normally present in your vagina.   Yeast infections which are caused by a naturally occurring fungus called candida.   Vaginal atrophy (atrophic vaginosis) which results from the thinning of the vagina from reduced estrogen levels after menopause.   Trichomoniasis which is caused by a parasite and is commonly transmitted by sexual intercourse.  Factors that increase your risk of developing vaginosis include: Marland Kitchen Medications, such as antibiotics and steroids . Uncontrolled diabetes . Use of hygiene products such as bubble bath, vaginal spray or vaginal deodorant . Douching . Wearing damp or tight-fitting clothing . Using an intrauterine device (IUD) for birth control . Hormonal changes, such as those associated with pregnancy, birth control pills or menopause . Sexual activity . Having a sexually transmitted infection  Your treatment plan is A single Diflucan (fluconazole) 150mg  tablet once.  I have electronically sent this prescription into the pharmacy that you have chosen. You can use this in place of the monistat since you do not tolerate this well.   Be sure to take all of the medication as directed. Stop taking any medication if you develop a rash, tongue swelling or shortness of breath. Mothers who are breast feeding should consider pumping and discarding their breast milk while on these antibiotics. However, there  is no consensus that infant exposure at these doses would be harmful.  Remember that medication creams can weaken latex condoms.   HOME CARE:  Good hygiene may prevent some types of vaginosis from recurring and may relieve some symptoms:  . Avoid baths, hot tubs and whirlpool spas. Rinse soap from your outer genital area after a shower, and dry the area well to prevent irritation. Don't use scented or harsh soaps, such as those with deodorant or antibacterial action. Marland Kitchen Avoid irritants. These include scented tampons and pads. . Wipe from front to back after using the toilet. Doing so avoids spreading fecal bacteria to your vagina.  Other things that may help prevent vaginosis include:  Marland Kitchen Don't douche. Your vagina doesn't require cleansing other than normal bathing. Repetitive douching disrupts the normal organisms that reside in the vagina and can actually increase your risk of vaginal infection. Douching won't clear up a vaginal infection. . Use a latex condom. Both female and female latex condoms may help you avoid infections spread by sexual contact. . Wear cotton underwear. Also wear pantyhose with a cotton crotch. If you feel comfortable without it, skip wearing underwear to bed. Yeast thrives in Marland Kitchen Your symptoms should improve in the next day or two.  GET HELP RIGHT AWAY IF:  . You have pain in your lower abdomen ( pelvic area or over your ovaries) . You develop nausea or vomiting . You develop a fever . Your discharge changes or worsens . You have persistent pain with intercourse . You develop shortness of breath, a rapid pulse, or you faint.  These symptoms  could be signs of problems or infections that need to be evaluated by a medical provider now.  MAKE SURE YOU    Understand these instructions.  Will watch your condition.  Will get help right away if you are not doing well or get worse.  Your e-visit answers were reviewed by a board certified advanced  clinical practitioner to complete your personal care plan. Depending upon the condition, your plan could have included both over the counter or prescription medications. Please review your pharmacy choice to make sure that you have choses a pharmacy that is open for you to pick up any needed prescription, Your safety is important to [place]. If you have drug allergies check your prescription carefully.   You can use MyChart to ask questions about today's visit, request a non-urgent call back, or ask for a work or school excuse for 24 hours related to this e-Visit. If it has been greater than 24 hours you will need to follow up with your provider, or enter a new e-Visit to address those concerns. You will get a MyChart message within the next two days asking about your experience. I hope that your e-visit has been valuable and will speed your recovery.

## 2020-06-26 NOTE — Progress Notes (Signed)
I have spent 5 minutes in review of e-visit questionnaire, review and updating patient chart, medical decision making and response to patient.   Shandon Burlingame Cody Terecia Plaut, PA-C    

## 2020-06-27 MED ORDER — FLUCONAZOLE 150 MG PO TABS: 150.0000 mg | ORAL_TABLET | Freq: Once | ORAL | 0 refills | Status: AC

## 2020-06-27 NOTE — Progress Notes (Signed)
Message received from patient stating prescription for Fluconazole was not received by her pharmacy, [facility]. Will resend.

## 2020-06-27 NOTE — Addendum Note (Signed)
Addended by: Jackquline Berlin on: 06/27/2020 10:21 AM   Modules accepted: Orders

## 2020-06-29 NOTE — Progress Notes (Signed)
Patient was assessed and managed by nursing staff during this encounter. I have reviewed the chart and agree with the documentation and plan. I have also made any necessary editorial changes.  Janyia Guion, MD 06/29/2020 10:04 AM   

## 2020-07-01 ENCOUNTER — Encounter: Payer: Self-pay | Admitting: Physician Assistant

## 2020-07-01 ENCOUNTER — Telehealth: Payer: Medicaid Other | Admitting: Physician Assistant

## 2020-07-01 DIAGNOSIS — B3731 Acute candidiasis of vulva and vagina: Secondary | ICD-10-CM

## 2020-07-01 DIAGNOSIS — B373 Candidiasis of vulva and vagina: Secondary | ICD-10-CM

## 2020-07-01 NOTE — Progress Notes (Signed)
Hi Keyra,   I am sorry you still aren't feeling well. I can see the [facility] prescribed Monistat to be used for 14 days.  Be sure to complete this course of medication. Do not skip a dose  If you take Diflucan (Fluconazole), you can take one more dose after 72 hours if you have not improved.   If you are STILL not feeling better, you need to go see the [facility] again for follow-up testing and perhaps a different treatment plan.    Based on what you shared with me, I feel your condition warrants further evaluation and I recommend that you be seen for a face to face visit.  Please contact your primary care physician practice to be seen. Many offices offer virtual options to be seen via video if you are not comfortable going in person to a medical facility at this time.  If you do not have a PCP, [place] offers a free physician referral service available at [phone]. Our trained staff has the experience, knowledge and resources to put you in touch with a physician who is right for you.   You also have the option of a video visit through [email or web address]  If you are having a true medical emergency please call 911.  NOTE: If you entered your credit card information for this eVisit, you will not be charged. You may see a "hold" on your card for the $35 but that hold will drop off and you will not have a charge processed.  Your e-visit answers were reviewed by a board certified advanced clinical practitioner to complete your personal care plan.  Thank you for using e-Visits.

## 2020-07-07 ENCOUNTER — Other Ambulatory Visit: Payer: Self-pay | Admitting: *Deleted

## 2020-07-07 MED ORDER — FLUCONAZOLE 150 MG PO TABS: 150.0000 mg | ORAL_TABLET | Freq: Once | ORAL | 1 refills | Status: AC

## 2020-07-20 DIAGNOSIS — Z419 Encounter for procedure for purposes other than remedying health state, unspecified: Secondary | ICD-10-CM | POA: Diagnosis not present

## 2020-08-03 ENCOUNTER — Encounter: Payer: Self-pay | Admitting: Family Medicine

## 2020-08-03 ENCOUNTER — Other Ambulatory Visit: Payer: Self-pay

## 2020-08-03 ENCOUNTER — Ambulatory Visit (INDEPENDENT_AMBULATORY_CARE_PROVIDER_SITE_OTHER): Payer: Medicaid Other | Admitting: Family Medicine

## 2020-08-03 ENCOUNTER — Other Ambulatory Visit (HOSPITAL_COMMUNITY)
Admission: RE | Admit: 2020-08-03 | Discharge: 2020-08-03 | Disposition: A | Discharge status: Home / Self Care | Payer: Medicaid Other | Source: Ambulatory Visit | Attending: Family Medicine | Admitting: Family Medicine

## 2020-08-03 VITALS — BP 125/87 | HR 90 | Ht 60.0 in | Wt 152.0 lb

## 2020-08-03 DIAGNOSIS — R399 Unspecified symptoms and signs involving the genitourinary system: Secondary | ICD-10-CM

## 2020-08-03 DIAGNOSIS — Z124 Encounter for screening for malignant neoplasm of cervix: Secondary | ICD-10-CM | POA: Insufficient documentation

## 2020-08-03 DIAGNOSIS — N898 Other specified noninflammatory disorders of vagina: Secondary | ICD-10-CM | POA: Insufficient documentation

## 2020-08-03 LAB — POCT URINALYSIS DIPSTICK
Blood, UA: NEGATIVE
Glucose, UA: NEGATIVE
Leukocytes, UA: NEGATIVE
Nitrite, UA: POSITIVE
Protein, UA: POSITIVE — AB
Spec Grav, UA: >=1.03 — AB (ref 1.010–1.025)
Urobilinogen, UA: 0.2 E.U./dL
pH, UA: 5 (ref 5.0–8.0)

## 2020-08-03 MED ORDER — FLUCONAZOLE 150 MG PO TABS: 150.0000 mg | ORAL_TABLET | Freq: Once | ORAL | 2 refills | Status: AC

## 2020-08-03 NOTE — Progress Notes (Signed)
   Subjective:    Patient ID: Sharon Steele is a 20 y.o. female presenting with Follow-up  on 08/03/2020  HPI: Previously seen and noted to have issues with irregular cycles, and was to try to keep her OCs better without missing doses. Treated for yeast recently. Having a lot of cloudy urine, and has a strong odor. Denies burning with urination. Reports vaginal discharge. Denies fever, chills.   Review of Systems  Constitutional: Negative for chills and fever.  Respiratory: Negative for shortness of breath.   Cardiovascular: Negative for chest pain.  Gastrointestinal: Negative for abdominal pain, nausea and vomiting.  Genitourinary: Negative for dysuria.  Skin: Negative for rash.      Objective:    BP 125/87   Pulse 90   Ht 5' (1.524 m)   Wt 152 lb (68.9 kg)   BMI 29.69 kg/m  Physical Exam Constitutional:      General: She is not in acute distress.    Appearance: She is well-developed.  HENT:     Head: Normocephalic and atraumatic.  Eyes:     General: No scleral icterus. Cardiovascular:     Rate and Rhythm: Normal rate.  Pulmonary:     Effort: Pulmonary effort is normal.  Abdominal:     Palpations: Abdomen is soft.  Genitourinary:    Comments: Thick, white, clumpy discharge noted Musculoskeletal:     Cervical back: Neck supple.  Skin:    General: Skin is warm and dry.  Neurological:     Mental Status: She is alert and oriented to person, place, and time.    Urinalysis    Component Value Date/Time   LABSPEC 1.025 11/24/2019 1012   PHURINE 7.0 11/24/2019 1012   GLUCOSEU NEGATIVE 11/24/2019 1012   HGBUR NEGATIVE 11/24/2019 1012   BILIRUBINUR NEGATIVE 11/24/2019 1012   KETONESUR NEGATIVE 11/24/2019 1012   PROTEINUR Positive (A) 08/03/2020 1019   PROTEINUR NEGATIVE 11/24/2019 1012   UROBILINOGEN 0.2 08/03/2020 1019   UROBILINOGEN 1.0 11/24/2019 1012   NITRITE Positive 08/03/2020 1019   NITRITE NEGATIVE 11/24/2019 1012   LEUKOCYTESUR Negative 08/03/2020  1019   LEUKOCYTESUR NEGATIVE 11/24/2019 1012          Assessment & Plan:  UTI symptoms - UA with nitrates, will check cultures - Plan: POCT Urinalysis Dipstick, Urine Culture  Vaginal discharge - Using boric acid--check wet prep and treat presumptively - Plan: Cervicovaginal ancillary only( [facility]), fluconazole (DIFLUCAN) 150 MG tablet  Screening for cervical cancer - needs pap due to age - Plan: Cytology - PAP( [facility])   Return if symptoms worsen or fail to improve.  [name] 08/03/2020 2:15 PM

## 2020-08-04 LAB — CERVICOVAGINAL ANCILLARY ONLY
Bacterial Vaginitis (gardnerella): NEGATIVE
Candida Glabrata: POSITIVE — AB
Candida Vaginitis: POSITIVE — AB
Chlamydia: NEGATIVE
Comment: NEGATIVE
Comment: NEGATIVE
Comment: NEGATIVE
Comment: NEGATIVE
Comment: NEGATIVE
Comment: NORMAL
Neisseria Gonorrhea: NEGATIVE
Trichomonas: NEGATIVE

## 2020-08-05 ENCOUNTER — Encounter: Payer: Self-pay | Admitting: Family Medicine

## 2020-08-05 DIAGNOSIS — R87612 Low grade squamous intraepithelial lesion on cytologic smear of cervix (LGSIL): Secondary | ICD-10-CM | POA: Insufficient documentation

## 2020-08-05 LAB — CYTOLOGY - PAP: Adequacy: ABSENT

## 2020-08-07 ENCOUNTER — Telehealth: Payer: Medicaid Other | Admitting: Physician Assistant

## 2020-08-07 ENCOUNTER — Other Ambulatory Visit: Payer: Self-pay

## 2020-08-07 ENCOUNTER — Emergency Department (HOSPITAL_COMMUNITY)
Admission: EM | Admit: 2020-08-07 | Discharge: 2020-08-08 | Disposition: A | Discharge status: Home / Self Care | Payer: Medicaid Other | Attending: Emergency Medicine | Admitting: Emergency Medicine

## 2020-08-07 ENCOUNTER — Encounter (HOSPITAL_COMMUNITY): Payer: Self-pay

## 2020-08-07 ENCOUNTER — Encounter: Payer: Self-pay | Admitting: Physician Assistant

## 2020-08-07 DIAGNOSIS — R112 Nausea with vomiting, unspecified: Secondary | ICD-10-CM | POA: Diagnosis present

## 2020-08-07 DIAGNOSIS — N10 Acute tubulo-interstitial nephritis: Secondary | ICD-10-CM | POA: Diagnosis not present

## 2020-08-07 DIAGNOSIS — R1110 Vomiting, unspecified: Secondary | ICD-10-CM

## 2020-08-07 DIAGNOSIS — R300 Dysuria: Secondary | ICD-10-CM

## 2020-08-07 DIAGNOSIS — R1032 Left lower quadrant pain: Secondary | ICD-10-CM | POA: Diagnosis not present

## 2020-08-07 DIAGNOSIS — N12 Tubulo-interstitial nephritis, not specified as acute or chronic: Secondary | ICD-10-CM | POA: Diagnosis not present

## 2020-08-07 DIAGNOSIS — N390 Urinary tract infection, site not specified: Secondary | ICD-10-CM

## 2020-08-07 DIAGNOSIS — R197 Diarrhea, unspecified: Secondary | ICD-10-CM

## 2020-08-07 DIAGNOSIS — R1031 Right lower quadrant pain: Secondary | ICD-10-CM

## 2020-08-07 LAB — URINALYSIS, ROUTINE W REFLEX MICROSCOPIC
Bilirubin Urine: NEGATIVE
Glucose, UA: NEGATIVE mg/dL
Hgb urine dipstick: NEGATIVE
Ketones, ur: NEGATIVE mg/dL
Leukocytes,Ua: NEGATIVE
Nitrite: POSITIVE — AB
Protein, ur: NEGATIVE mg/dL
Specific Gravity, Urine: 1.021 (ref 1.005–1.030)
pH: 7 (ref 5.0–8.0)

## 2020-08-07 LAB — URINE CULTURE

## 2020-08-07 NOTE — ED Triage Notes (Signed)
Patient from home with UTI symptoms, states she was told she had e.coli in her urine but has not stated abx, also with vomiting and migraine.

## 2020-08-07 NOTE — Progress Notes (Signed)
Based on what you shared with me, I feel your condition warrants further evaluation and I recommend that you be seen for a face to face office visit.  Ms. Eagen,  Due to your symptoms of abdominal pain, vomiting, and urinary complaints, I would advise that you have a face to face visit for further evaluation of your symptoms. Right lower quadrant pain, in the setting of symptoms such as vomiting, maybe related to appendicitis. If your symptoms are due to a UTI, developing vomiting with UTI could indicate that its a more complicated UTI and requires further testing and more treatment that for a simple UTI.    NOTE: If you entered your credit card information for this eVisit, you will not be charged. You may see a "hold" on your card for the $35 but that hold will drop off and you will not have a charge processed.   If you are having a true medical emergency please call 911.      For an urgent face to face visit, [place] has five urgent care centers for your convenience:     [facility] at [facility] Directions [phone] [address] [place], [place] [address] . 10 am - 6pm Monday - Friday    [facility] Urgent Care Center [facility]) Get Driving Directions [phone] [address] [place], [place] [address] . 10 am to 8 pm Monday-Friday . 12 pm to 8 pm [facility] Urgent Care at [facility] Get Driving Directions [phone] [address] [place], [place] [address] . 8 am to 8 pm Monday-Friday . 9 am to 6 pm Saturday . 11 am to 6 pm Sunday     [facility] Urgent Care at [facility] Get Driving Directions  [phone] [address] [place], [place] [address] . 8 am to 8 pm Monday-Friday . 8 am to 4 pm [facility] Urgent Care at [facility] Directions [phone] [address] [place], [place] [address] . 12 pm to 6  pm Monday-Friday      Your e-visit answers were reviewed by a board certified advanced clinical practitioner to complete your personal care plan.  Thank you for using e-Visits.    I spent 5-10 minutes on review and completion of this note- Illa Level [facility]

## 2020-08-08 ENCOUNTER — Emergency Department (HOSPITAL_COMMUNITY): Payer: Medicaid Other

## 2020-08-08 DIAGNOSIS — R1032 Left lower quadrant pain: Secondary | ICD-10-CM | POA: Diagnosis not present

## 2020-08-08 DIAGNOSIS — N390 Urinary tract infection, site not specified: Secondary | ICD-10-CM | POA: Diagnosis not present

## 2020-08-08 LAB — CBC
HCT: 37.1 % (ref 36.0–46.0)
Hemoglobin: 12.1 g/dL (ref 12.0–15.0)
MCH: 30.1 pg (ref 26.0–34.0)
MCHC: 32.6 g/dL (ref 30.0–36.0)
MCV: 92.3 fL (ref 80.0–100.0)
Platelets: 261 10*3/uL (ref 150–400)
RBC: 4.02 MIL/uL (ref 3.87–5.11)
RDW: 12.7 % (ref 11.5–15.5)
WBC: 6 10*3/uL (ref 4.0–10.5)
nRBC: 0 % (ref 0.0–0.2)

## 2020-08-08 LAB — COMPREHENSIVE METABOLIC PANEL
ALT: 14 U/L (ref 0–44)
AST: 18 U/L (ref 15–41)
Albumin: 3.7 g/dL (ref 3.5–5.0)
Alkaline Phosphatase: 45 U/L (ref 38–126)
Anion gap: 6 (ref 5–15)
BUN: 7 mg/dL (ref 6–20)
CO2: 25 mmol/L (ref 22–32)
Calcium: 9 mg/dL (ref 8.9–10.3)
Chloride: 102 mmol/L (ref 98–111)
Creatinine, Ser: 0.8 mg/dL (ref 0.44–1.00)
GFR, Estimated: >60 mL/min (ref >60)
Glucose, Bld: 93 mg/dL (ref 70–99)
Potassium: 3.8 mmol/L (ref 3.5–5.1)
Sodium: 133 mmol/L — ABNORMAL LOW (ref 135–145)
Total Bilirubin: 0.7 mg/dL (ref 0.3–1.2)
Total Protein: 7.6 g/dL (ref 6.5–8.1)

## 2020-08-08 LAB — LIPASE, BLOOD: Lipase: 46 U/L (ref 11–51)

## 2020-08-08 MED ORDER — ONDANSETRON HCL 4 MG/2ML IJ SOLN
4.0000 mg | Freq: Once | INTRAMUSCULAR | Status: AC
  Administered 2020-08-08: 4 mg via INTRAVENOUS
  Filled 2020-08-08: qty 2

## 2020-08-08 MED ORDER — AMOXICILLIN 500 MG PO CAPS: 500.0000 mg | ORAL_CAPSULE | Freq: Three times a day (TID) | ORAL | 0 refills | Status: DC

## 2020-08-08 MED ORDER — ONDANSETRON 4 MG PO TBDP: 4.0000 mg | ORAL_TABLET | Freq: Three times a day (TID) | ORAL | 0 refills | Status: DC | PRN

## 2020-08-08 MED ORDER — SODIUM CHLORIDE 0.9 % IV SOLN
1.0000 g | Freq: Once | INTRAVENOUS | Status: AC
  Administered 2020-08-08: 1 g via INTRAVENOUS
  Filled 2020-08-08: qty 10

## 2020-08-08 MED ORDER — SODIUM CHLORIDE 0.9 % IV BOLUS
1000.0000 mL | Freq: Once | INTRAVENOUS | Status: AC
  Administered 2020-08-08: 1000 mL via INTRAVENOUS

## 2020-08-08 NOTE — Discharge Instructions (Signed)
Take the antibiotic and nausea medication as prescribed.  Follow-up with your doctor.  Return to the ED worsening pain, fever, vomiting, not able to eat or drink or any other concerns.

## 2020-08-08 NOTE — ED Provider Notes (Addendum)
[facility] EMERGENCY DEPARTMENT Provider Note   CSN: [id number] Arrival date & time: 08/07/20  1931     History Chief Complaint  Patient presents with  . Urinary Tract Infection  . Abdominal Pain    Sharon Steele is a 20 y.o. female.  Patient reports UTI symptoms for the past 4 days including dysuria, frequency, urgency and "strong and dark urine".  She saw her PCP on March 15 and had a urine culture which grew pansensitive E. coli but was not started on antibiotics.  She had a televisit complaining of right-sided abdominal pain and nausea and vomiting was referred to the ED.  Here she denies any right-sided abdominal pain or flank pain.  States she has intermittent left lower quadrant pain that comes and goes only when she has to urinate.  The pain last for several minutes when she urinates and then goes away.  Still having pain with urination and dark cloudy urine.  States she has vomited several times in the past 3 or 4 days.  Has been constipated and unchanged with her bowel movements.  No abnormal vaginal bleeding or discharge. States the gynecologist performed a pelvic exam which was reassuring. When she gets pain is in the left lower quadrant lasting for just a few seconds at a time. She does not have any flank pain or right sided abdominal pain.  The history is provided by the patient.  Urinary Tract Infection Associated symptoms: abdominal pain, flank pain, nausea and vomiting   Associated symptoms: no fever and no vaginal discharge   Abdominal Pain Associated symptoms: dysuria, hematuria, nausea and vomiting   Associated symptoms: no chest pain, no cough, no fatigue, no fever, no shortness of breath, no vaginal bleeding and no vaginal discharge        Past Medical History:  Diagnosis Date  . HSV (herpes simplex virus) infection     Patient Active Problem List   Diagnosis Date Noted  . 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 Menorrhagia with  regular cycle 03/18/2020  . 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 Hidradenitis 03/18/2020    History reviewed. No pertinent surgical history.   OB History    Gravida  0   Para  0   Term  0   Preterm  0   AB  0   Living  0     SAB  0   IAB  0   Ectopic  0   Multiple  0   Live Births  0           Family History  Problem Relation Age of Onset  . Cancer Mother   . Hypertension Father   . Hypertension Paternal Grandfather   . Hypertension Paternal Grandmother   . Diabetes Maternal Grandmother   . Diabetes Maternal Grandfather   . Hypertension Paternal Aunt   . Multiple sclerosis Paternal Aunt     Social History   Tobacco Use  . Smoking status: Never Smoker  . Smokeless tobacco: Never Used  Vaping Use  . Vaping Use: Never used  Substance Use Topics  . Alcohol use: Yes  . Drug use: Not Currently    Home Medications Prior to Admission medications   Medication Sig Start Date End Date Taking? Authorizing Provider  ibuprofen (ADVIL) 800 MG tablet Take 1 tablet (800 mg total) by mouth 3 (three) times daily with meals. Patient not taking: Reported on 08/03/2020 11/24/19   Mardella Layman, MD  norgestimate-ethinyl  estradiol (ORTHO-CYCLEN) 0.25-35 MG-MCG tablet Take 1 tablet by mouth daily. 06/22/20   [name], MD  ondansetron (ZOFRAN) 8 MG tablet Take 1 tablet (8 mg total) by mouth every 8 (eight) hours as needed for nausea or vomiting. Patient not taking: Reported on 08/03/2020 05/25/20   Rhys Martini, PA-C  valACYclovir (VALTREX) 1000 MG tablet Take 1 tablet (1,000 mg total) by mouth daily. 03/18/20   [name], MD  Vitamin D, Ergocalciferol, (DRISDOL) 1.25 MG (50000 UNIT) CAPS capsule Take 1 capsule (50,000 Units total) by mouth every 7 (seven) days. Patient not taking: Reported on 08/03/2020 10/30/19   Anders Simmonds, PA-C  cetirizine (ZYRTEC) 10 MG tablet Take 1 tablet (10 mg total) by mouth daily. For 3 days 09/06/16 11/24/19  Ree Shay, MD   ipratropium (ATROVENT) 0.06 % nasal spray Place 2 sprays into both nostrils 4 (four) times daily. Patient not taking: Reported on 09/06/2016 07/04/16 11/24/19  Deatra Canter, FNP    Allergies    Cashew nut oil and Almond oil  Review of Systems   Review of Systems  Constitutional: Negative for activity change, appetite change, fatigue and fever.  HENT: Negative for congestion and rhinorrhea.   Respiratory: Negative for cough and shortness of breath.   Cardiovascular: Negative for chest pain.  Gastrointestinal: Positive for abdominal pain, nausea and vomiting.  Genitourinary: Positive for dysuria, flank pain, hematuria and urgency. Negative for vaginal bleeding and vaginal discharge.  Musculoskeletal: Positive for back pain. Negative for arthralgias and myalgias.  Neurological: Negative for dizziness, weakness and headaches.   all other systems are negative except as noted in the HPI and PMH.    Physical Exam Updated Vital Signs BP (!) 120/95 (BP Location: Left Arm)   Pulse 64   Temp 98.6 F (37 C)   Resp 18   Ht 5' (1.524 m)   Wt 68.9 kg   LMP 07/26/2020   SpO2 99%   BMI 29.69 kg/m   Physical Exam Vitals and nursing note reviewed.  Constitutional:      General: She is not in acute distress.    Appearance: She is well-developed.     Comments: Comfortable, no distress  HENT:     Head: Normocephalic and atraumatic.     Mouth/Throat:     Pharynx: No oropharyngeal exudate.  Eyes:     Conjunctiva/sclera: Conjunctivae normal.     Pupils: Pupils are equal, round, and reactive to light.  Neck:     Comments: No meningismus. Cardiovascular:     Rate and Rhythm: Normal rate and regular rhythm.     Heart sounds: Normal heart sounds. No murmur heard.   Pulmonary:     Effort: Pulmonary effort is normal. No respiratory distress.     Breath sounds: Normal breath sounds.  Abdominal:     Palpations: Abdomen is soft.     Tenderness: There is abdominal tenderness. There is no  guarding or rebound.     Comments: Soft abdomen, left lower quadrant tenderness, no guarding or rebound, no right-sided tenderness  Musculoskeletal:        General: No tenderness. Normal range of motion.     Cervical back: Normal range of motion and neck supple.     Comments: No CVA tenderness  Skin:    General: Skin is warm.  Neurological:     Mental Status: She is alert and oriented to person, place, and time.     Cranial Nerves: No cranial nerve deficit.  Motor: No abnormal muscle tone.     Coordination: Coordination normal.     Comments:  5/5 strength throughout. CN 2-12 intact.Equal grip strength.   Psychiatric:        Behavior: Behavior normal.     ED Results / Procedures / Treatments   Labs (all labs ordered are listed, but only abnormal results are displayed) Labs Reviewed  COMPREHENSIVE METABOLIC PANEL - Abnormal; Notable for the following components:      Result Value   Sodium 133 (*)    All other components within normal limits  URINALYSIS, ROUTINE W REFLEX MICROSCOPIC - Abnormal; Notable for the following components:   APPearance HAZY (*)    Nitrite POSITIVE (*)    Bacteria, UA RARE (*)    All other components within normal limits  LIPASE, BLOOD  CBC  I-STAT BETA HCG BLOOD, ED ([facility], [facility], [facility] ONLY)  Controlled EKG  EKG None  Radiology US Renal  Result Date: 08/08/2020 CLINICAL DATA:  Initial evaluation for left lower quadrant pain, UTI. EXAM: RENAL / URINARY TRACT ULTRASOUND COMPLETE COMPARISON:  None. FINDINGS: Right Kidney: Renal measurements: 10.2 x 3.9 x 4.3 cm = volume: 90.2 mL. Renal echogenicity within normal limits. No nephrolithiasis or hydronephrosis. No focal renal mass. Left Kidney: Renal measurements: 10.4 x 4.6 x 4.3 cm = volume: 106.3 mL. Renal echogenicity within normal limits. No nephrolithiasis or hydronephrosis. No focal renal mass. Bladder: Appears normal for degree of bladder distention. Other: None. IMPRESSION: Normal renal ultrasound. No  hydronephrosis or other significant finding. Electronically Signed   By: Rise MuBenjamin  McClintock M.D.   On: 08/08/2020 02:11   US PELVIC COMPLETE W TRANSVAGINAL AND TORSION R/O  Result Date: 08/08/2020 CLINICAL DATA:  Initial evaluation for acute left lower quadrant pain, UTI. EXAM: TRANSABDOMINAL AND TRANSVAGINAL ULTRASOUND OF PELVIS DOPPLER ULTRASOUND OF OVARIES TECHNIQUE: Both transabdominal and transvaginal ultrasound examinations of the pelvis were performed. Transabdominal technique was performed for global imaging of the pelvis including uterus, ovaries, adnexal regions, and pelvic cul-de-sac. It was necessary to proceed with endovaginal exam following the transabdominal exam to visualize the uterus, endometrium, and ovaries. Color and duplex Doppler ultrasound was utilized to evaluate blood flow to the ovaries. COMPARISON:  Prior ultrasound from 03/30/2020 FINDINGS: Uterus Measurements: 8.6 x 3.2 x 3.6 cm = volume: 51.5 mL. No fibroids or other mass visualized. Endometrium Thickness: 6.8 mm.  No focal abnormality visualized. Right ovary Measurements: 4.0 x 1.3 x 2.0 cm = volume: 5.3 mL. Normal appearance/no adnexal mass. Left ovary Measurements: 3.3 x 1.3 x 1.7 cm = volume: 3.8 mL. Normal appearance/no adnexal mass. Probable 8 mm dominant follicle noted. Pulsed Doppler evaluation of both ovaries demonstrates normal low-resistance arterial and venous waveforms. Other findings No abnormal free fluid. IMPRESSION: Normal pelvic ultrasound. No evidence for ovarian torsion or other acute abnormality. Electronically Signed   By: Rise MuBenjamin  McClintock M.D.   On: 08/08/2020 02:09    Procedures Procedures   Medications Ordered in ED Medications  sodium chloride 0.9 % bolus 1,000 mL (has no administration in time range)  cefTRIAXone (ROCEPHIN) 1 g in sodium chloride 0.9 % 100 mL IVPB (has no administration in time range)  ondansetron (ZOFRAN) injection 4 mg (has no administration in time range)    ED  Course  I have reviewed the triage vital signs and the nursing notes.  Pertinent labs & imaging results that were available during my care of the patient were reviewed by me and considered in my medical decision making (see chart  for details).    MDM Rules/Calculators/A&P                         UTI symptoms for several days with nausea and vomiting intermittent left-sided lower abdominal pain.  Vitals are stable.  Abdomen soft without peritoneal signs.  Urine culture from March 15 grew pansensitive E. Coli. HCG negative. Did not cross over.   Patient appears well with stable vital signs.  Her labs are reassuring.  Ultrasound was obtained to rule out obstructing kidney stone and this is negative. No evidence of ovarian torsion.  Patient able to tolerate p.o. without vomiting.  Will treat empirically with antibiotics for suspected UTI/pyelonephritis.  Patient given amoxicillin.  Culture sensitive to same.  Patient able to tolerate p.o. without vomiting.  Pain is controlled.  Return to the ED with difficulty with urination, fever, vomiting, worsening pain, any other concerns.  Final Clinical Impression(s) / ED Diagnoses Final diagnoses:  Pyelonephritis    Rx / DC Orders ED Discharge Orders    None       Rylea Selway, Jeannett Senior, MD 08/08/20 0923    Glynn Octave, MD 08/08/20 [phone]

## 2020-08-08 NOTE — ED Notes (Signed)
Patient transported to US 

## 2020-08-08 NOTE — ED Notes (Signed)
Pt passed fluid challenge, no nausea or vomitting

## 2020-08-09 ENCOUNTER — Telehealth: Payer: Self-pay

## 2020-08-09 NOTE — Telephone Encounter (Signed)
Transition Care Management Unsuccessful Follow-up Telephone Call  Date of discharge and from where:  08/08/2020 from [place] Attempts:  1st Attempt  Reason for unsuccessful TCM follow-up call:  Left voice message     

## 2020-08-10 NOTE — Telephone Encounter (Signed)
Transition Care Management Unsuccessful Follow-up Telephone Call  Date of discharge and from where:  08/08/2020 - [facility]  Attempts:  2nd Attempt  Reason for unsuccessful TCM follow-up call:  Left voice message 

## 2020-08-11 NOTE — Telephone Encounter (Signed)
Transition Care Management Unsuccessful Follow-up Telephone Call  Date of discharge and from where:  08/08/2020 from [facility]  Attempts:  3rd Attempt  Reason for unsuccessful TCM follow-up call:  Unable to reach patient

## 2020-08-20 DIAGNOSIS — Z419 Encounter for procedure for purposes other than remedying health state, unspecified: Secondary | ICD-10-CM | POA: Diagnosis not present

## 2020-08-21 ENCOUNTER — Emergency Department (HOSPITAL_COMMUNITY)
Admission: EM | Admit: 2020-08-21 | Discharge: 2020-08-21 | Disposition: A | Discharge status: Home / Self Care | Payer: Medicaid Other | Attending: Emergency Medicine | Admitting: Emergency Medicine

## 2020-08-21 ENCOUNTER — Emergency Department (HOSPITAL_COMMUNITY): Payer: Medicaid Other

## 2020-08-21 ENCOUNTER — Encounter (HOSPITAL_COMMUNITY): Payer: Self-pay

## 2020-08-21 ENCOUNTER — Ambulatory Visit (HOSPITAL_COMMUNITY)
Admission: EM | Admit: 2020-08-21 | Discharge: 2020-08-21 | Disposition: A | Discharge status: Home / Self Care | Payer: Medicaid Other

## 2020-08-21 ENCOUNTER — Other Ambulatory Visit: Payer: Self-pay

## 2020-08-21 DIAGNOSIS — G43009 Migraine without aura, not intractable, without status migrainosus: Secondary | ICD-10-CM | POA: Diagnosis not present

## 2020-08-21 DIAGNOSIS — R519 Headache, unspecified: Secondary | ICD-10-CM

## 2020-08-21 LAB — CBC WITH DIFFERENTIAL/PLATELET
Abs Immature Granulocytes: 0.01 10*3/uL (ref 0.00–0.07)
Basophils Absolute: 0 10*3/uL (ref 0.0–0.1)
Basophils Relative: 0 %
Eosinophils Absolute: 0.1 10*3/uL (ref 0.0–0.5)
Eosinophils Relative: 1 %
HCT: 39.4 % (ref 36.0–46.0)
Hemoglobin: 12.8 g/dL (ref 12.0–15.0)
Immature Granulocytes: 0 %
Lymphocytes Relative: 26 %
Lymphs Abs: 1.1 10*3/uL (ref 0.7–4.0)
MCH: 29.9 pg (ref 26.0–34.0)
MCHC: 32.5 g/dL (ref 30.0–36.0)
MCV: 92.1 fL (ref 80.0–100.0)
Monocytes Absolute: 0.7 10*3/uL (ref 0.1–1.0)
Monocytes Relative: 15 %
Neutro Abs: 2.5 10*3/uL (ref 1.7–7.7)
Neutrophils Relative %: 58 %
Platelets: 283 10*3/uL (ref 150–400)
RBC: 4.28 MIL/uL (ref 3.87–5.11)
RDW: 12.4 % (ref 11.5–15.5)
WBC: 4.4 10*3/uL (ref 4.0–10.5)
nRBC: 0 % (ref 0.0–0.2)

## 2020-08-21 LAB — URINALYSIS, ROUTINE W REFLEX MICROSCOPIC
Bilirubin Urine: NEGATIVE
Glucose, UA: NEGATIVE mg/dL
Hgb urine dipstick: NEGATIVE
Ketones, ur: NEGATIVE mg/dL
Leukocytes,Ua: NEGATIVE
Nitrite: NEGATIVE
Protein, ur: NEGATIVE mg/dL
Specific Gravity, Urine: 1.019 (ref 1.005–1.030)
pH: 6 (ref 5.0–8.0)

## 2020-08-21 LAB — BASIC METABOLIC PANEL
Anion gap: 7 (ref 5–15)
BUN: 11 mg/dL (ref 6–20)
CO2: 27 mmol/L (ref 22–32)
Calcium: 9.7 mg/dL (ref 8.9–10.3)
Chloride: 107 mmol/L (ref 98–111)
Creatinine, Ser: 0.93 mg/dL (ref 0.44–1.00)
GFR, Estimated: >60 mL/min (ref >60)
Glucose, Bld: 97 mg/dL (ref 70–99)
Potassium: 4.5 mmol/L (ref 3.5–5.1)
Sodium: 141 mmol/L (ref 135–145)

## 2020-08-21 LAB — I-STAT BETA HCG BLOOD, ED (MC, WL, AP ONLY): I-stat hCG, quantitative: <5 m[IU]/mL (ref <5)

## 2020-08-21 MED ORDER — IOHEXOL 350 MG/ML SOLN
100.0000 mL | Freq: Once | INTRAVENOUS | Status: AC | PRN
  Administered 2020-08-21: 100 mL via INTRAVENOUS

## 2020-08-21 MED ORDER — DIPHENHYDRAMINE HCL 50 MG/ML IJ SOLN
12.5000 mg | Freq: Once | INTRAMUSCULAR | Status: AC
  Administered 2020-08-21: 12.5 mg via INTRAVENOUS
  Filled 2020-08-21: qty 1

## 2020-08-21 MED ORDER — IBUPROFEN 600 MG PO TABS: 600.0000 mg | ORAL_TABLET | Freq: Three times a day (TID) | ORAL | 0 refills | Status: DC | PRN

## 2020-08-21 MED ORDER — ACETAMINOPHEN 500 MG PO TABS
1000.0000 mg | ORAL_TABLET | Freq: Once | ORAL | Status: AC
  Administered 2020-08-21: 1000 mg via ORAL
  Filled 2020-08-21: qty 2

## 2020-08-21 MED ORDER — PROCHLORPERAZINE EDISYLATE 10 MG/2ML IJ SOLN
10.0000 mg | Freq: Once | INTRAMUSCULAR | Status: AC
  Administered 2020-08-21: 10 mg via INTRAVENOUS
  Filled 2020-08-21: qty 2

## 2020-08-21 NOTE — ED Notes (Signed)
Pt ambulatory to the bathroom 

## 2020-08-21 NOTE — ED Notes (Signed)
Pt ambulatory to CT

## 2020-08-21 NOTE — Discharge Instructions (Addendum)
Please go directly to the emergency room 

## 2020-08-21 NOTE — ED Provider Notes (Signed)
[facility]    CSN: [id number] Arrival date & time: 08/21/20  1321      History   Chief Complaint Chief Complaint  Patient presents with  . Migraine    HPI Sharon Steele is a 20 y.o. female.   HPI  Migraine: Patient states that for the past month she has had a migraine.  She states that the past week the headaches have been daily and have been significantly worse.  She has not been able to sleep due to pain.  She is having visual changes where her right eye looks blurry and reports that at night lights look starlike in nature.  She has not thrown up recently but has had some nausea for the course of the last month, neurological changes, no head trauma.  She has not tried anything for symptoms.  She is concerned as aneurysms run in her family especially her mother patient reports has had "7".    Past Medical History:  Diagnosis Date  . HSV (herpes simplex virus) infection     Patient Active Problem List   Diagnosis Date Noted  . 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 Menorrhagia with regular cycle 03/18/2020  . 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 Hidradenitis 03/18/2020    History reviewed. No pertinent surgical history.  OB History    Gravida  0   Para  0   Term  0   Preterm  0   AB  0   Living  0     SAB  0   IAB  0   Ectopic  0   Multiple  0   Live Births  0            Home Medications    Prior to Admission medications   Medication Sig Start Date End Date Taking? Authorizing Provider  amoxicillin (AMOXIL) 500 MG capsule Take 1 capsule (500 mg total) by mouth 3 (three) times daily. 08/08/20   Rancour, Jeannett Senior, MD  ibuprofen (ADVIL) 800 MG tablet Take 1 tablet (800 mg total) by mouth 3 (three) times daily with meals. Patient not taking: Reported on 08/03/2020 11/24/19   Mardella Layman, MD  norgestimate-ethinyl estradiol (ORTHO-CYCLEN) 0.25-35 MG-MCG tablet Take 1 tablet by mouth daily. 06/22/20   [name], MD   ondansetron (ZOFRAN ODT) 4 MG disintegrating tablet Take 1 tablet (4 mg total) by mouth every 8 (eight) hours as needed for nausea or vomiting. 08/08/20   Rancour, Jeannett Senior, MD  ondansetron (ZOFRAN) 8 MG tablet Take 1 tablet (8 mg total) by mouth every 8 (eight) hours as needed for nausea or vomiting. Patient not taking: Reported on 08/03/2020 05/25/20   Rhys Martini, PA-C  valACYclovir (VALTREX) 1000 MG tablet Take 1 tablet (1,000 mg total) by mouth daily. 03/18/20   [name], MD  Vitamin D, Ergocalciferol, (DRISDOL) 1.25 MG (50000 UNIT) CAPS capsule Take 1 capsule (50,000 Units total) by mouth every 7 (seven) days. Patient not taking: Reported on 08/03/2020 10/30/19   Anders Simmonds, PA-C  cetirizine (ZYRTEC) 10 MG tablet Take 1 tablet (10 mg total) by mouth daily. For 3 days 09/06/16 11/24/19  Ree Shay, MD  ipratropium (ATROVENT) 0.06 % nasal spray Place 2 sprays into both nostrils 4 (four) times daily. Patient not taking: Reported on 09/06/2016 07/04/16 11/24/19  Deatra Canter, FNP    Family History Family History  Problem Relation Age of Onset  . Cancer Mother   . Hypertension Father   .  Hypertension Paternal Grandfather   . Hypertension Paternal Grandmother   . Diabetes Maternal Grandmother   . Diabetes Maternal Grandfather   . Hypertension Paternal Aunt   . Multiple sclerosis Paternal Aunt     Social History Social History   Tobacco Use  . Smoking status: Never Smoker  . Smokeless tobacco: Never Used  Vaping Use  . Vaping Use: Never used  Substance Use Topics  . Alcohol use: Yes  . Drug use: Not Currently     Allergies   Cashew nut oil and Almond oil   Review of Systems Review of Systems  As stated above in HPI Physical Exam Triage Vital Signs ED Triage Vitals  Enc Vitals Group     BP 08/21/20 1422 127/89     Pulse Rate 08/21/20 1422 85     Resp 08/21/20 1422 18     Temp 08/21/20 1422 98.6 F (37 C)     Temp Source 08/21/20 1422 Oral     SpO2  08/21/20 1422 98 %     Weight --      Height --      Head Circumference --      Peak Flow --      Pain Score 08/21/20 1420 6     Pain Loc --      Pain Edu? --      Excl. in GC? --    No data found.  Updated Vital Signs BP 127/89 (BP Location: Right Arm)   Pulse 85   Temp 98.6 F (37 C) (Oral)   Resp 18   LMP 07/26/2020   SpO2 98%   Physical Exam Vitals and nursing note reviewed.  Constitutional:      General: She is not in acute distress.    Appearance: Normal appearance. She is not ill-appearing, toxic-appearing or diaphoretic.  Eyes:     Comments: Photosensitivity to light.  Pain with range of motion of the eyes especially to medial gaze of the right eye.   Skin:    General: Skin is warm.     Capillary Refill: Capillary refill takes less than 2 seconds.  Neurological:     Mental Status: She is alert and oriented to person, place, and time.     Cranial Nerves: Cranial nerves are intact. No cranial nerve deficit or facial asymmetry.     Sensory: No sensory deficit.     Motor: No weakness or tremor.     Coordination: Coordination is intact. Coordination normal.     Gait: Gait normal.     Deep Tendon Reflexes: Reflexes normal.      UC Treatments / Results  Labs (all labs ordered are listed, but only abnormal results are displayed) Labs Reviewed - No data to display  EKG   Radiology No results found.  Procedures Procedures (including critical care time)  Medications Ordered in UC Medications - No data to display  Initial Impression / Assessment and Plan / UC Course  I have reviewed the triage vital signs and the nursing notes.  Pertinent labs & imaging results that were available during my care of the patient were reviewed by me and considered in my medical decision making (see chart for details).     New.  Wide differential including cerebral mass, increased intracranial pressure.  Discussed my concerns with patient.  Has been slowly worsening over the  course of a month.  Patient does have a driver with her today and prefers to go to the emergency room via  private vehicle instead of EMS.    Final Clinical Impressions(s) / UC Diagnoses   Final diagnoses:  None   Discharge Instructions   None    ED Prescriptions    None     PDMP not reviewed this encounter.   Rushie Chestnut, [place] 08/21/20 1510

## 2020-08-21 NOTE — ED Triage Notes (Signed)
Pt c/o migraine x 1 month. She states this past week the headaches have been everyday and states she has not been able to sleep. She states Aneurysms run in her family.

## 2020-08-21 NOTE — ED Triage Notes (Signed)
Pt reports right sided migraine x1 month with photosensitivity. Pt went to UC and was sent here for CT head. Pt has family of hx of aneurysms.

## 2020-08-21 NOTE — Discharge Instructions (Signed)
Take Tylenol or Motrin for pain control.  Return to ER if you develop uncontrolled headache, neck pain neck stiffness, fever or other new concerning symptom.  Strongly recommend recheck with your primary doctor on Monday.

## 2020-08-21 NOTE — ED Provider Notes (Signed)
[facility]   CSN: [id number] Arrival date & time: 08/21/20  1533     History Chief Complaint  Patient presents with  . Migraine   Sharon Steele is a 20 y.o. female.  Presents to the emergency room with concern for headache.  Patient reports that she is suffering from severe headache for the past month.  Over the past week or 2 has been daily.  States that pain is worse on the right side, up to 10 out of 10 in severity.  Feels like she has a stabbing sensation behind her right eye, feels slightly blurry.  Is able to see without difficulty.  No actual loss of vision.  No numbness or weakness.  Does have sensitivity to light.  Has not taken any medication today.  Sent from urgent care for further evaluation.  HPI     Past Medical History:  Diagnosis Date  . HSV (herpes simplex virus) infection     Patient Active Problem List   Diagnosis Date Noted  . 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 Menorrhagia with regular cycle 03/18/2020  . 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 Hidradenitis 03/18/2020    History reviewed. No pertinent surgical history.   OB History    Gravida  0   Para  0   Term  0   Preterm  0   AB  0   Living  0     SAB  0   IAB  0   Ectopic  0   Multiple  0   Live Births  0           Family History  Problem Relation Age of Onset  . Cancer Mother   . Hypertension Father   . Hypertension Paternal Grandfather   . Hypertension Paternal Grandmother   . Diabetes Maternal Grandmother   . Diabetes Maternal Grandfather   . Hypertension Paternal Aunt   . Multiple sclerosis Paternal Aunt     Social History   Tobacco Use  . Smoking status: Never Smoker  . Smokeless tobacco: Never Used  Vaping Use  . Vaping Use: Never used  Substance Use Topics  . Alcohol use: Yes  . Drug use: Not Currently    Home Medications Prior to Admission medications   Medication Sig Start Date End  Date Taking? Authorizing Provider  amoxicillin (AMOXIL) 500 MG capsule Take 1 capsule (500 mg total) by mouth 3 (three) times daily. 08/08/20   Rancour, Jeannett Senior, MD  ibuprofen (ADVIL) 600 MG tablet Take 1 tablet (600 mg total) by mouth every 8 (eight) hours as needed. 08/21/20   Milagros Loll, MD  norgestimate-ethinyl estradiol (ORTHO-CYCLEN) 0.25-35 MG-MCG tablet Take 1 tablet by mouth daily. 06/22/20   [name], MD  ondansetron (ZOFRAN ODT) 4 MG disintegrating tablet Take 1 tablet (4 mg total) by mouth every 8 (eight) hours as needed for nausea or vomiting. 08/08/20   Rancour, Jeannett Senior, MD  ondansetron (ZOFRAN) 8 MG tablet Take 1 tablet (8 mg total) by mouth every 8 (eight) hours as needed for nausea or vomiting. Patient not taking: Reported on 08/03/2020 05/25/20   Rhys Martini, PA-C  valACYclovir (VALTREX) 1000 MG tablet Take 1 tablet (1,000 mg total) by mouth daily. 03/18/20   [name], MD  Vitamin D, Ergocalciferol, (DRISDOL) 1.25 MG (50000 UNIT) CAPS capsule Take 1 capsule (50,000 Units total) by mouth every 7 (seven) days. Patient not taking: Reported on 08/03/2020  10/30/19   Anders Simmonds, PA-C  cetirizine (ZYRTEC) 10 MG tablet Take 1 tablet (10 mg total) by mouth daily. For 3 days 09/06/16 11/24/19  Ree Shay, MD  ipratropium (ATROVENT) 0.06 % nasal spray Place 2 sprays into both nostrils 4 (four) times daily. Patient not taking: Reported on 09/06/2016 07/04/16 11/24/19  Deatra Canter, FNP    Allergies    Cashew nut oil and Almond oil  Review of Systems   Review of Systems  Constitutional: Negative for chills and fever.  HENT: Negative for ear pain and sore throat.   Eyes: Negative for pain and visual disturbance.  Respiratory: Negative for cough and shortness of breath.   Cardiovascular: Negative for chest pain and palpitations.  Gastrointestinal: Negative for abdominal pain and vomiting.  Genitourinary: Negative for dysuria and hematuria.  Musculoskeletal: Negative  for arthralgias and back pain.  Skin: Negative for color change and rash.  Neurological: Positive for headaches. Negative for seizures, syncope and numbness.  All other systems reviewed and are negative.   Physical Exam Updated Vital Signs BP (!) 138/101 (BP Location: Left Arm)   Pulse 78   Temp 98.1 F (36.7 C) (Oral)   Resp 16   Ht 5' (1.524 m)   Wt 68.5 kg   LMP 08/18/2020 Comment: upreg negt  SpO2 100%   BMI 29.49 kg/m   Physical Exam Vitals and nursing note reviewed.  Constitutional:      General: She is not in acute distress.    Appearance: She is well-developed.  HENT:     Head: Normocephalic and atraumatic.  Eyes:     Conjunctiva/sclera: Conjunctivae normal.  Cardiovascular:     Rate and Rhythm: Normal rate and regular rhythm.     Heart sounds: No murmur heard.   Pulmonary:     Effort: Pulmonary effort is normal. No respiratory distress.     Breath sounds: Normal breath sounds.  Abdominal:     Palpations: Abdomen is soft.     Tenderness: There is no abdominal tenderness.  Musculoskeletal:        General: No deformity or signs of injury.     Cervical back: Neck supple.  Skin:    General: Skin is warm and dry.     Capillary Refill: Capillary refill takes less than 2 seconds.  Neurological:     Mental Status: She is alert.     Comments: AAOx3 CN 2-12 intact, speech clear visual fields intact 5/5 strength in b/l UE and LE Sensation to light touch intact in b/l UE and LE Normal FNF Normal gait     ED Results / Procedures / Treatments   Labs (all labs ordered are listed, but only abnormal results are displayed) Labs Reviewed  CBC WITH DIFFERENTIAL/PLATELET  BASIC METABOLIC PANEL  URINALYSIS, ROUTINE W REFLEX MICROSCOPIC  I-STAT BETA HCG BLOOD, ED ([facility], [facility], [facility] ONLY)    EKG None  Radiology CT Angio Head W or Wo Contrast  Result Date: 08/21/2020 CLINICAL DATA:  Severe headaches. Migraine headaches for 1 month. Sensitivity to light. Family history  of aneurysms. EXAM: CT ANGIOGRAPHY HEAD AND NECK TECHNIQUE: Multidetector CT imaging of the head and neck was performed using the standard protocol during bolus administration of intravenous contrast. Multiplanar CT image reconstructions and MIPs were obtained to evaluate the vascular anatomy. Carotid stenosis measurements (when applicable) are obtained utilizing NASCET criteria, using the distal internal carotid diameter as the denominator. CONTRAST:  OMNIPAQUE IOHEXOL 350 MG/ML SOLN COMPARISON:  Report of  CT head without contrast 08/30/2000. FINDINGS: CT HEAD FINDINGS Brain: No acute infarct, hemorrhage, or mass lesion is present. No significant white matter lesions are present. The ventricles are of normal size. No significant extraaxial fluid collection is present. Vascular: No hyperdense vessel or unexpected calcification. Skull: Calvarium is intact. No focal lytic or blastic lesions are present. No significant extracranial soft tissue lesion is present. Sinuses: The paranasal sinuses and mastoid air cells are clear. Orbits: The globes and orbits are within normal limits. Review of the MIP images confirms the above findings CTA NECK FINDINGS Aortic arch: 3 vessel arch configuration present. No significant stenosis or aneurysm present. Right carotid system: Right common carotid artery is within normal limits. Bifurcation is unremarkable. Cervical right ICA is within normal limits. Left carotid system: The left common carotid artery is within normal limits. Bifurcation is unremarkable. Cervical left ICA normal. Vertebral arteries: The left vertebral artery is the dominant vessel. Both vertebral arteries originate from the subclavian arteries without significant stenosis. There is no significant stenosis of either vertebral artery in the neck. Skeleton: Straightening and some reversal of normal cervical lordosis is noted. No significant listhesis. Other neck: Soft tissues of the neck are otherwise  unremarkable. Upper chest: Lung apices are clear. The thoracic inlet is within normal limits. Review of the MIP images confirms the above findings CTA HEAD FINDINGS Anterior circulation: The internal carotid arteries are within normal limits from the skull base through the ICA termini bilaterally. The A1 and M1 segments are normal. Anterior communicating artery is patent. ACA and MCA branch vessels are within. MCA bifurcations are. Posterior circulation: The left vertebral artery is the dominant vessel. PICA origin visualized and within normal limits bilaterally. The vertebrobasilar junction normal. Basilar artery is within normal limits. Both posterior cerebral arteries originate from the basilar tip. Right posterior communicating artery contributes. PCA branch vessels are within normal limits. Venous sinuses: Dural sinuses are patent. The right transverse sinus is dominant. Straight sinus deep cerebral veins are intact. Cortical veins are unremarkable. Anatomic variants: None Review of the MIP images confirms the above findings IMPRESSION: 1. Normal variant CTA Circle of Willis without significant proximal stenosis, aneurysm, or branch vessel occlusion. 2. Normal CTA of the neck. 3. Normal noncontrast CT of the head. No acute or focal lesion to explain the patient's headaches. Electronically Signed   By: Marin Robertshristopher  Mattern M.D.   On: 08/21/2020 18:34   CT Angio Neck W and/or Wo Contrast  Result Date: 08/21/2020 CLINICAL DATA:  Severe headaches. Migraine headaches for 1 month. Sensitivity to light. Family history of aneurysms. EXAM: CT ANGIOGRAPHY HEAD AND NECK TECHNIQUE: Multidetector CT imaging of the head and neck was performed using the standard protocol during bolus administration of intravenous contrast. Multiplanar CT image reconstructions and MIPs were obtained to evaluate the vascular anatomy. Carotid stenosis measurements (when applicable) are obtained utilizing NASCET criteria, using the distal  internal carotid diameter as the denominator. CONTRAST:  100mL OMNIPAQUE IOHEXOL 350 MG/ML SOLN COMPARISON:  Report of CT head without contrast 08/30/2000. FINDINGS: CT HEAD FINDINGS Brain: No acute infarct, hemorrhage, or mass lesion is present. No significant white matter lesions are present. The ventricles are of normal size. No significant extraaxial fluid collection is present. Vascular: No hyperdense vessel or unexpected calcification. Skull: Calvarium is intact. No focal lytic or blastic lesions are present. No significant extracranial soft tissue lesion is present. Sinuses: The paranasal sinuses and mastoid air cells are clear. Orbits: The globes and orbits are within normal limits. Review  of the MIP images confirms the above findings CTA NECK FINDINGS Aortic arch: 3 vessel arch configuration present. No significant stenosis or aneurysm present. Right carotid system: Right common carotid artery is within normal limits. Bifurcation is unremarkable. Cervical right ICA is within normal limits. Left carotid system: The left common carotid artery is within normal limits. Bifurcation is unremarkable. Cervical left ICA normal. Vertebral arteries: The left vertebral artery is the dominant vessel. Both vertebral arteries originate from the subclavian arteries without significant stenosis. There is no significant stenosis of either vertebral artery in the neck. Skeleton: Straightening and some reversal of normal cervical lordosis is noted. No significant listhesis. Other neck: Soft tissues of the neck are otherwise unremarkable. Upper chest: Lung apices are clear. The thoracic inlet is within normal limits. Review of the MIP images confirms the above findings CTA HEAD FINDINGS Anterior circulation: The internal carotid arteries are within normal limits from the skull base through the ICA termini bilaterally. The A1 and M1 segments are normal. Anterior communicating artery is patent. ACA and MCA branch vessels are  within. MCA bifurcations are. Posterior circulation: The left vertebral artery is the dominant vessel. PICA origin visualized and within normal limits bilaterally. The vertebrobasilar junction normal. Basilar artery is within normal limits. Both posterior cerebral arteries originate from the basilar tip. Right posterior communicating artery contributes. PCA branch vessels are within normal limits. Venous sinuses: Dural sinuses are patent. The right transverse sinus is dominant. Straight sinus deep cerebral veins are intact. Cortical veins are unremarkable. Anatomic variants: None Review of the MIP images confirms the above findings IMPRESSION: 1. Normal variant CTA Circle of Willis without significant proximal stenosis, aneurysm, or branch vessel occlusion. 2. Normal CTA of the neck. 3. Normal noncontrast CT of the head. No acute or focal lesion to explain the patient's headaches. Electronically Signed   By: Marin Roberts M.D.   On: 08/21/2020 18:34    Procedures Procedures   Medications Ordered in ED Medications  prochlorperazine (COMPAZINE) injection 10 mg (10 mg Intravenous Given 08/21/20 1710)  diphenhydrAMINE (BENADRYL) injection 12.5 mg (12.5 mg Intravenous Given 08/21/20 1711)  acetaminophen (TYLENOL) tablet 1,000 mg (1,000 mg Oral Given 08/21/20 1711)  iohexol (OMNIPAQUE) 350 MG/ML injection 100 mL (100 mLs Intravenous Contrast Given 08/21/20 1805)    ED Course  I have reviewed the triage vital signs and the nursing notes.  Pertinent labs & imaging results that were available during my care of the patient were reviewed by me and considered in my medical decision making (see chart for details).    MDM Rules/Calculators/A&P                          20 year old lady presents to ER with concern for severe headache.  On exam she was well-appearing in no distress.  She had a normal neurologic exam.  Given her reported family history of aneurysms, severity of headache, obtain CTA head/neck.   Negative for any acute process or aneurysm.  On reassessment, her symptoms have completely resolved after migraine cocktail.  No headache or visual complaints.  Will discharge home.  Recommend follow-up with primary doctor.  After the discussed management above, the patient was determined to be safe for discharge.  The patient was in agreement with this plan and all questions regarding their care were answered.  ED return precautions were discussed and the patient will return to the ED with any significant worsening of condition.   Final Clinical Impression(s) /  ED Diagnoses Final diagnoses:  Migraine without aura and without status migrainosus, not intractable    Rx / DC Orders ED Discharge Orders         Ordered    ibuprofen (ADVIL) 600 MG tablet  Every 8 hours PRN        08/21/20 1907           Milagros Loll, MD 08/22/20 1430

## 2020-08-23 ENCOUNTER — Telehealth: Payer: Self-pay

## 2020-08-23 NOTE — Telephone Encounter (Signed)
Transition Care Management Unsuccessful Follow-up Telephone Call  Date of discharge and from where:  08/21/2020 from [facility]  Attempts:  1st Attempt  Reason for unsuccessful TCM follow-up call:  Left voice message

## 2020-08-24 NOTE — Telephone Encounter (Signed)
Transition Care Management Unsuccessful Follow-up Telephone Call  Date of discharge and from where:  08/21/2020 from [facility]   Attempts:  2nd Attempt  Reason for unsuccessful TCM follow-up call:  Left voice message     

## 2020-08-25 ENCOUNTER — Other Ambulatory Visit: Payer: Self-pay

## 2020-08-25 ENCOUNTER — Ambulatory Visit (INDEPENDENT_AMBULATORY_CARE_PROVIDER_SITE_OTHER): Payer: Medicaid Other | Admitting: Dermatology

## 2020-08-25 DIAGNOSIS — L309 Dermatitis, unspecified: Secondary | ICD-10-CM | POA: Diagnosis not present

## 2020-08-25 DIAGNOSIS — L732 Hidradenitis suppurativa: Secondary | ICD-10-CM

## 2020-08-25 DIAGNOSIS — L700 Acne vulgaris: Secondary | ICD-10-CM | POA: Diagnosis not present

## 2020-08-25 MED ORDER — TRIAMCINOLONE ACETONIDE 0.1 % EX CREA: 1.0000 "application " | TOPICAL_CREAM | Freq: Two times a day (BID) | CUTANEOUS | 1 refills | Status: DC | PRN

## 2020-08-25 MED ORDER — SPIRONOLACTONE 100 MG PO TABS: 100.0000 mg | ORAL_TABLET | Freq: Every day | ORAL | 1 refills | Status: DC

## 2020-08-25 NOTE — Patient Instructions (Addendum)
Gentle Skin Care Guide  1. Bathe no more than once a day.  2. Avoid bathing in hot water  3. Use a mild soap like Dove, Vanicream, Cetaphil, CeraVe. Can use Lever 2000 or Cetaphil antibacterial soap  4. Use soap only where you need it. On most days, use it under your arms, between your legs, and on your feet. Let the water rinse other areas unless visibly dirty.  5. When you get out of the bath/shower, use a towel to gently blot your skin dry, don't rub it.  6. While your skin is still a little damp, apply a moisturizing cream such as Vanicream, CeraVe, Cetaphil, Eucerin, Sarna lotion or plain Vaseline Jelly. For hands apply Neutrogena [place] Hand Cream or Excipial Hand Cream.  7. Reapply moisturizer any time you start to itch or feel dry.  8. Sometimes using free and clear laundry detergents can be helpful. Fabric softener sheets should be avoided. Downy Free & Gentle liquid, or any liquid fabric softener that is free of dyes and perfumes, it acceptable to use  9. If your doctor has given you prescription creams you may apply moisturizers over them   Atopic dermatitis (eczema) is a chronic, relapsing, pruritic condition that can significantly affect quality of life. It is often associated with allergic rhinitis and/or asthma and can require treatment with topical medications, phototherapy, or in severe cases a biologic medication called Dupixent in older children and adults.   Avoid applying to face, groin, and axilla. Use as directed. Risk of skin atrophy with long-term use reviewed.  Topical steroids (such as triamcinolone, fluocinolone, fluocinonide, mometasone, clobetasol, halobetasol, betamethasone, hydrocortisone) can cause thinning and lightening of the skin if they are used for too long in the same area. Your physician has selected the right strength medicine for your problem and area affected on the body. Please use your medication only as directed by your physician to prevent  side effects.   Spironolactone can cause increased urination and cause blood pressure to decrease. Please watch for signs of lightheadedness and be cautious when changing position. It can sometimes cause breast tenderness or an irregular period in premenopausal women. It can also increase potassium. The increase in potassium usually is not a concern unless you are taking other medicines that also increase potassium, so please be sure your doctor knows all of the other medications you are taking. This medication should not be taken by pregnant women.  This medicine should also not be taken together with sulfa drugs like Bactrim (trimethoprim/sulfamethexazole).   Reviewed potential side effects of isotretinoin including xerosis, cheilitis, hepatitis, hyperlipidemia, and severe birth defects if taken by a pregnant woman. Reviewed reports of suicidal ideation in those with a history of depression while taking isotretinoin and reports of diagnosis of inflammatory bowl disease while taking isotretinoin as well as the lack of evidence for a causal relationship between isotretinoin, depression and IBD. Patient advised to reach out with any questions or concerns. Patient advised not to share pills or donate blood while on treatment or for one month after completing treatment.   Hidradenitis Suppurativa is a chronic; persistent; non-curable, but treatable condition due to abnormal inflamed sweat glands in the body folds (axilla, inframammary, groin, medial thighs), causing recurrent painful cysts and scarring. It can be associated with severe scarring acne and cysts; abscesses and scarring of scalp. The goal is control and prevention of flares, as it is not curable. Scars are permanent and can be thickened. Treatment may include daily use of  topical medication and oral antibiotics.  Oral isotretinoin may also be helpful.  For more severe cases, Humira (a biologic injection) may be prescribed to decrease the  inflammatory process and prevent flares.  When indicated, inflamed cysts may also be treated surgically.  If you have any questions or concerns for your doctor, please call our main line at [phone] and press option 4 to reach your doctor's medical assistant. If no one answers, please leave a voicemail as directed and we will return your call as soon as possible. Messages left after 4 pm will be answered the following business day.   You may also send [place] a message via MyChart. We typically respond to MyChart messages within 1-2 business days.  For prescription refills, please ask your pharmacy to contact our office. Our fax number is [phone].  If you have an urgent issue when the clinic is closed that cannot wait until the next business day, you can page your doctor at the number below.    Please note that while we do our best to be available for urgent issues outside of office hours, we are not available 24/7.   If you have an urgent issue and are unable to reach [place], you may choose to seek medical care at your doctor's office, retail clinic, urgent care center, or emergency room.  If you have a medical emergency, please immediately call 911 or go to the emergency department.  Pager Numbers  - Dr. Gwen Pounds: [phone]  - Dr. [name]: [phone]  - Dr. Roseanne Reno: [phone]  In the event of inclement weather, please call our main line at [phone] for an update on the status of any delays or closures.  Dermatology Medication Tips: Please keep the boxes that topical medications come in in order to help keep track of the instructions about where and how to use these. Pharmacies typically print the medication instructions only on the boxes and not directly on the medication tubes.   If your medication is too expensive, please contact our office at [phone] option 4 or send [place] a message through MyChart.   We are unable to tell what your co-pay for medications will be in  advance as this is different depending on your insurance coverage. However, we may be able to find a substitute medication at lower cost or fill out paperwork to get insurance to cover a needed medication.   If a prior authorization is required to get your medication covered by your insurance company, please allow [place] 1-2 business days to complete this process.  Drug prices often vary depending on where the prescription is filled and some pharmacies may offer cheaper prices.  The website www.goodrx.com contains coupons for medications through different pharmacies. The prices here do not account for what the cost may be with help from insurance (it may be cheaper with your insurance), but the website can give you the price if you did not use any insurance.  - You can print the associated coupon and take it with your prescription to the pharmacy.  - You may also stop by our office during regular business hours and pick up a GoodRx coupon card.  - If you need your prescription sent electronically to a different pharmacy, notify our office through [facility] or by phone at [phone] option 4.

## 2020-08-25 NOTE — Progress Notes (Signed)
New Patient Visit  Subjective  Sharon Steele is a 20 y.o. female who presents for the following: New Patient (Initial Visit) (Patient here today concerning bumps on inner thighs, abdomen, and under breast she has noticed. She also has concerns today with acne on face. Patient states she has been dealing with for a long time (> 1 year). She states it recently has became painful. Patient is not currently using any prescribed medication to treat acne.). She also has some irritated areas on her hands.  Objective  Well appearing patient in no apparent distress; mood and affect are within normal limits.  A focused examination was performed including face, neck, chest, back, axilla, abdomen, bilateral inframmary folds, bilateral inner thighs, supra pubic area . Relevant physical exam findings are noted in the Assessment and Plan.  Objective  face, chest, back: Face with 2 + inflammatory papules, cysts 1 + open comedones, chest scattered inflammatory papules, and back with inflammatory papules and scaring   Objective  inframammary, abdomen, supra pubic, inner thighs, sinus tract right inner thigh, axilla: Many inflammatory papules and nodules and open comedones inframammary, abdomen, supra pubic, inner thighs, sinus tract right inner thigh, axilla with open comedones     Objective  bilateral hands: Scaly erythematous papules and patches +/- dyspigmentation, lichenification, excoriations.   Assessment & Plan  Acne vulgaris face, chest, back  Chronic condition with duration or expected duration over one year. Condition is bothersome to patient. Currently flared.  Deeper involvement and scaring  Plan isotretinoin  Patient is concerned with scaring - recommend microneedling  Patient is currently on bc pills and will add condoms or other barrier method Patient denies history of depression  Wait for microneedling at least 6 months, preferably 1 year after completing treatment for acne  with isotretinoin  Reviewed potential side effects of isotretinoin including xerosis, cheilitis, hepatitis, hyperlipidemia, and severe birth defects if taken by a pregnant woman. Reviewed reports of suicidal ideation in those with a history of depression while taking isotretinoin and reports of diagnosis of inflammatory bowl disease while taking isotretinoin as well as the lack of evidence for a causal relationship between isotretinoin, depression and IBD. Patient advised to reach out with any questions or concerns. Patient advised not to share pills or donate blood while on treatment or for one month after completing treatment.  Accutane is discussed fully with the patient. It is a very effective drug to treat acne vulgaris but has many significant side effects. Chief among these are teratogenesis, hepatic injury, dyslipidemia and severe drying of the mucous membranes. All of these issues have been discussed in details. Monthly blood tests to monitor lipids and liver functions will be necessary. Expect painful dryness and/or fissuring around the lips, eyes, and other moist areas of the body. Balms may be protective. Contact lens may be too painful to wear temporarily while on this drug. Episodes of significant depression have been reported, including suicidal ideation and attempts in rare cases. It may also cause pseudotumor cerebri and hyperostosis. The patient will report any such changes in mood, depressive symptoms or suicidal thoughts, headaches, joint or bone pains.  Female patients MUST use two simultaneous methods of family planning. Accutane is Category X for pregnancy, meaning it will cause fetal teratogenic malformations, and pregnancy MUST be avoided while on this drug.  The dose is 0.5-1 mg per kg in two divided doses for 15-20 weeks.  After discussion of these important issues, she indicates complete understanding of all of the  above, and does wish to proceed with accutane therapy.  Urine  pregnancy test performed in office today and was negative.  Patient demonstrates comprehension and confirms she will not get pregnant.   Patient registered in [place] today REMS ID [id number]  Gentle Skin Care Guide included in patient's handout   Start spironolactone 100 mg qhs  BP 149/93 today in clinic  Spironolactone can cause increased urination and cause blood pressure to decrease. Please watch for signs of lightheadedness and be cautious when changing position. It can sometimes cause breast tenderness or an irregular period in premenopausal women. It can also increase potassium. The increase in potassium usually is not a concern unless you are taking other medicines that also increase potassium, so please be sure your doctor knows all of the other medications you are taking. This medication should not be taken by pregnant women.  This medicine should also not be taken together with sulfa drugs like Bactrim (trimethoprim/sulfamethexazole).    Hidradenitis suppurativa inframammary, abdomen, supra pubic, inner thighs, sinus tract right inner thigh, axilla  Chronic condition with duration over one year. Condition is bothersome to patient. Currently flared.  Hidradenitis Suppurativa is a chronic; persistent; non-curable, but treatable condition due to abnormal inflamed sweat glands in the body folds (axilla, inframammary, groin, medial thighs), causing recurrent painful cysts and scarring. It can be associated with severe scarring acne and cysts; abscesses and scarring of scalp. The goal is control and prevention of flares, as it is not curable. Scars are permanent and can be thickened. Treatment may include daily use of topical medication and oral antibiotics.  Oral isotretinoin may also be helpful.  For more severe cases, Humira (a biologic injection) may be prescribed to decrease the inflammatory process and prevent flares.  When indicated, inflamed cysts may also be treated  surgically.  Discussed Spironolactone can cause increased urination and cause blood pressure to decrease. Please watch for signs of lightheadedness and be cautious when changing position. It can sometimes cause breast tenderness or an irregular period in premenopausal women. It can also increase potassium. The increase in potassium usually is not a concern unless you are taking other medicines that also increase potassium, so please be sure your doctor knows all of the other medications you are taking. This medication should not be taken by pregnant women.  This medicine should also not be taken together with sulfa drugs like Bactrim (trimethoprim/sulfamethexazole).   Check BP today 149/93  Start spironolactone 100 by mouth qhs. [facility]   Isotretinoin may also be helpful for this.   spironolactone (ALDACTONE) 100 MG tablet - inframammary, abdomen, supra pubic, inner thighs, sinus tract right inner thigh, axilla  Eczema, unspecified type bilateral hands  Atopic dermatitis (eczema) is a chronic, relapsing, pruritic condition that can significantly affect quality of life. It is often associated with allergic rhinitis and/or asthma and can require treatment with topical medications, phototherapy, or in severe cases a biologic medication called Dupixent in older children and adults.   Start Triamcinolone 0.1 % cream apply to affected areas of hands twice daily as needed for up to 2 weeks. Avoid applying to face, groin, and axilla. Use as directed. Risk of skin atrophy with long-term use reviewed.   Topical steroids (such as triamcinolone, fluocinolone, fluocinonide, mometasone, clobetasol, halobetasol, betamethasone, hydrocortisone) can cause thinning and lightening of the skin if they are used for too long in the same area. Your physician has selected the right strength medicine for your problem and area  affected on the body. Please use your medication only as directed by your physician to  prevent side effects.    triamcinolone (KENALOG) 0.1 % - bilateral hands  Return in about 1 month (around 09/24/2020) for acne follow up .  I, Asher Muir, CMA, am acting as scribe for Darden Dates, MD.  Documentation: I have reviewed the above documentation for accuracy and completeness, and I agree with the above.  Darden Dates, MD

## 2020-08-26 NOTE — Telephone Encounter (Signed)
Transition Care Management Unsuccessful Follow-up Telephone Call  Date of discharge and from where:  08/21/2020 from [place]  Attempts:  3rd Attempt  Reason for unsuccessful TCM follow-up call:  Unable to reach patient     

## 2020-09-06 ENCOUNTER — Encounter: Payer: Self-pay | Admitting: Dermatology

## 2020-09-14 ENCOUNTER — Ambulatory Visit (INDEPENDENT_AMBULATORY_CARE_PROVIDER_SITE_OTHER): Payer: Medicaid Other

## 2020-09-14 ENCOUNTER — Other Ambulatory Visit (HOSPITAL_COMMUNITY)
Admission: RE | Admit: 2020-09-14 | Discharge: 2020-09-14 | Disposition: A | Discharge status: Home / Self Care | Payer: Medicaid Other | Source: Ambulatory Visit | Attending: Obstetrics & Gynecology | Admitting: Obstetrics & Gynecology

## 2020-09-14 ENCOUNTER — Other Ambulatory Visit: Payer: Self-pay

## 2020-09-14 VITALS — BP 132/84 | HR 71

## 2020-09-14 DIAGNOSIS — Z113 Encounter for screening for infections with a predominantly sexual mode of transmission: Secondary | ICD-10-CM | POA: Insufficient documentation

## 2020-09-14 NOTE — Progress Notes (Signed)
Patient was assessed and managed by nursing staff during this encounter. I have reviewed the chart and agree with the documentation and plan. I have also made any necessary editorial changes.  Jaynie Collins, MD 09/14/2020 11:57 AM

## 2020-09-14 NOTE — Progress Notes (Signed)
SUBJECTIVE:  20 y.o. female presents for a STI screen  Denies abnormal vaginal bleeding or significant pelvic pain or fever. No UTI symptoms.   Patient's last menstrual period was 08/18/2020.  OBJECTIVE:  She appears well, afebrile. Urine dipstick: not done.  ASSESSMENT:  STI Screen   PLAN:  GC, chlamydia, trichomonas, BVAG, CVAG probe sent to lab. Treatment: To be determined once lab results are received ROV prn if symptoms persist or worsen.  **STI Screen lab work also ordered, pt requested.

## 2020-09-15 LAB — HEPATITIS C ANTIBODY: Hep C Virus Ab: <0.1 s/co ratio (ref 0.0–0.9)

## 2020-09-15 LAB — CERVICOVAGINAL ANCILLARY ONLY
Chlamydia: NEGATIVE
Comment: NEGATIVE
Comment: NEGATIVE
Comment: NORMAL
Neisseria Gonorrhea: NEGATIVE
Trichomonas: NEGATIVE

## 2020-09-15 LAB — RPR: RPR Ser Ql: NONREACTIVE

## 2020-09-15 LAB — HEPATITIS B SURFACE ANTIGEN: Hepatitis B Surface Ag: NEGATIVE

## 2020-09-15 LAB — HIV ANTIBODY (ROUTINE TESTING W REFLEX): HIV Screen 4th Generation wRfx: NONREACTIVE

## 2020-09-19 DIAGNOSIS — Z419 Encounter for procedure for purposes other than remedying health state, unspecified: Secondary | ICD-10-CM | POA: Diagnosis not present

## 2020-09-23 ENCOUNTER — Telehealth: Payer: Medicaid Other | Admitting: Physician Assistant

## 2020-09-23 DIAGNOSIS — B9689 Other specified bacterial agents as the cause of diseases classified elsewhere: Secondary | ICD-10-CM | POA: Diagnosis not present

## 2020-09-23 DIAGNOSIS — N760 Acute vaginitis: Secondary | ICD-10-CM | POA: Diagnosis not present

## 2020-09-23 MED ORDER — METRONIDAZOLE 500 MG PO TABS: 500.0000 mg | ORAL_TABLET | Freq: Two times a day (BID) | ORAL | 0 refills | Status: DC

## 2020-09-23 NOTE — Progress Notes (Signed)

## 2020-09-28 ENCOUNTER — Encounter: Payer: Self-pay | Admitting: Dermatology

## 2020-09-28 ENCOUNTER — Ambulatory Visit (INDEPENDENT_AMBULATORY_CARE_PROVIDER_SITE_OTHER): Payer: Medicaid Other | Admitting: Dermatology

## 2020-09-28 ENCOUNTER — Other Ambulatory Visit: Payer: Self-pay

## 2020-09-28 DIAGNOSIS — L700 Acne vulgaris: Secondary | ICD-10-CM | POA: Diagnosis not present

## 2020-09-28 DIAGNOSIS — L732 Hidradenitis suppurativa: Secondary | ICD-10-CM | POA: Diagnosis not present

## 2020-09-28 DIAGNOSIS — L905 Scar conditions and fibrosis of skin: Secondary | ICD-10-CM

## 2020-09-28 MED ORDER — AZITHROMYCIN 500 MG PO TABS: ORAL_TABLET | ORAL | 0 refills | Status: DC

## 2020-09-28 MED ORDER — CLINDAMYCIN PHOSPHATE 1 % EX LOTN: TOPICAL_LOTION | Freq: Two times a day (BID) | CUTANEOUS | 2 refills | Status: DC

## 2020-09-28 NOTE — Patient Instructions (Addendum)
Start azithromycin 500mg  three times weekly x 1 month. (Tuesday, Wednesday and Thursday) Continue spironolactone 100mg  1 by mouth daily.   Start clindamycin lotion to affected areas twice daily.   Spironolactone can cause increased urination and cause blood pressure to decrease. Please watch for signs of lightheadedness and be cautious when changing position. It can sometimes cause breast tenderness or an irregular period in premenopausal women. It can also increase potassium. The increase in potassium usually is not a concern unless you are taking other medicines that also increase potassium, so please be sure your doctor knows all of the other medications you are taking. This medication should not be taken by pregnant women.  This medicine should also not be taken together with sulfa drugs like Bactrim (trimethoprim/sulfamethexazole).   While taking Isotretinoin and for 30 days after you finish the medication, do not get pregnant, do not share pills, do not donate blood. Isotretinoin is best absorbed when taken with a fatty meal. Isotretinoin can make you sensitive to the sun. Daily careful sun protection including sunscreen SPF 30+ when outdoors is recommended.  If you have any questions or concerns for your doctor, please call our main line at [phone] and press option 4 to reach your doctor's medical assistant. If no one answers, please leave a voicemail as directed and we will return your call as soon as possible. Messages left after 4 pm will be answered the following business day.   You may also send a message via MyChart. We typically respond to MyChart messages within 1-2 business days.  For prescription refills, please ask your pharmacy to contact our office. Our fax number is [phone].  If you have an urgent issue when the clinic is closed that cannot wait until the next business day, you can page your doctor at the number below.    Please note that while we do our best to be  available for urgent issues outside of office hours, we are not available 24/7.   If you have an urgent issue and are unable to reach [place], you may choose to seek medical care at your doctor's office, retail clinic, urgent care center, or emergency room.  If you have a medical emergency, please immediately call 911 or go to the emergency department.  Pager Numbers  - Dr. [phone]: [phone]  - Dr. Gwen Pounds: [phone]  - Dr. [name]: [phone]  In the event of inclement weather, please call our main line at [phone] for an update on the status of any delays or closures.  Dermatology Medication Tips: Please keep the boxes that topical medications come in in order to help keep track of the instructions about where and how to use these. Pharmacies typically print the medication instructions only on the boxes and not directly on the medication tubes.   If your medication is too expensive, please contact our office at [phone] option 4 or send [phone] a message through MyChart.   We are unable to tell what your co-pay for medications will be in advance as this is different depending on your insurance coverage. However, we may be able to find a substitute medication at lower cost or fill out paperwork to get insurance to cover a needed medication.   If a prior authorization is required to get your medication covered by your insurance company, please allow [phone] 1-2 business days to complete this process.  Drug prices often vary depending on where the prescription is filled and some pharmacies may offer cheaper prices.  The website www.goodrx.com contains coupons for medications through different pharmacies. The prices here do not account for what the cost may be with help from insurance (it may be cheaper with your insurance), but the website can give you the price if you did not use any insurance.  - You can print the associated coupon and take it with your prescription to the pharmacy.  -  You may also stop by our office during regular business hours and pick up a GoodRx coupon card.  - If you need your prescription sent electronically to a different pharmacy, notify our office through [facility] or by phone at [phone] option 4.

## 2020-09-28 NOTE — Progress Notes (Signed)
Follow-Up Visit   Subjective  Sharon Steele is a 20 y.o. female who presents for the following: Acne (Patient here today to start accutane. She is taking spironolactone 100mg  QHS for acne and HS. Patient advises she does have some areas that are flared from HS at inframammary and groin. ).  Patient is tolerating spironolactone well, no light headedness.    The following portions of the chart were reviewed this encounter and updated as appropriate:   Tobacco  Allergies  Meds  Problems  Med Hx  Surg Hx  Fam Hx      Review of Systems:  No other skin or systemic complaints except as noted in HPI or Assessment and Plan.  Objective  Well appearing patient in no apparent distress; mood and affect are within normal limits.  A focused examination was performed including face, neck, chest and back and groin, inframammary. Relevant physical exam findings are noted in the Assessment and Plan.  Objective  Head - Anterior (Face): Scattered inflammatory papules and pustules 2+ at face, few scars at cheeks, cysts submandibular, trace open comedones Chest with trace open come, few inflammatory papules Back with scattered inflammatory papules, scars and trace open comedones   Objective  Left Medial Thigh: Multiple inflammatory papules and nodules inframammary Many inflammatory papules, plaques, rare soft inflammatory nodule and some scarring at groin  Objective  Right Nose: Dyspigmented smooth macule or patch.    Assessment & Plan  Acne vulgaris Head - Anterior (Face)  Continue spironolactone 100mg  1 PO QD Pending labs start Absorica 20mg  1 PO QD with fatty meal BP 113/85 [facility] pills/condoms  Acne is chronic, severe, not at goal.  While taking Isotretinoin and for 30 days after you finish the medication, do not get pregnant, do not share pills, do not donate blood. Isotretinoin is best absorbed when taken with a fatty meal. Isotretinoin can make you  sensitive to the sun. Daily careful sun protection including sunscreen SPF 30+ when outdoors is recommended.  Spironolactone can cause increased urination and cause blood pressure to decrease. Please watch for signs of lightheadedness and be cautious when changing position. It can sometimes cause breast tenderness or an irregular period in premenopausal women. It can also increase potassium. The increase in potassium usually is not a concern unless you are taking other medicines that also increase potassium, so please be sure your doctor knows all of the other medications you are taking. This medication should not be taken by pregnant women.  This medicine should also not be taken together with sulfa drugs like Bactrim (trimethoprim/sulfamethexazole).    Other Related Procedures Comprehensive metabolic panel Lipid panel hCG, serum, qualitative  Ordered Medications: azithromycin (ZITHROMAX) 500 MG tablet  Other Related Medications ISOtretinoin (ABSORICA) 20 MG capsule  Hidradenitis suppurativa Left Medial Thigh  Start azithromycin 500mg  three times weekly x 1 month #12 0RF Continue spironolactone 100mg  1 PO QD.  Start clindamycin lotion to AA's twice daily.  BP 113/85  Starting isotretinoin  Spironolactone can cause increased urination and cause blood pressure to decrease. Please watch for signs of lightheadedness and be cautious when changing position. It can sometimes cause breast tenderness or an irregular period in premenopausal women. It can also increase potassium. The increase in potassium usually is not a concern unless you are taking other medicines that also increase potassium, so please be sure your doctor knows all of the other medications you are taking. This medication should not be taken by pregnant women.  This  medicine should also not be taken together with sulfa drugs like Bactrim (trimethoprim/sulfamethexazole).    Other Related Procedures Comprehensive metabolic  panel Lipid panel hCG, serum, qualitative  Ordered Medications: azithromycin (ZITHROMAX) 500 MG tablet  Other Related Medications spironolactone (ALDACTONE) 100 MG tablet  Scar Right Nose  S/p dog bite  Return in about 30 days (around 10/28/2020) for Isotretinoin, HS.  Anise Salvo, RMA, am acting as scribe for Darden Dates, MD .  Documentation: I have reviewed the above documentation for accuracy and completeness, and I agree with the above.  Darden Dates, MD

## 2020-09-29 ENCOUNTER — Telehealth: Payer: Self-pay

## 2020-09-29 DIAGNOSIS — L700 Acne vulgaris: Secondary | ICD-10-CM

## 2020-09-29 LAB — COMPREHENSIVE METABOLIC PANEL
ALT: 18 IU/L (ref 0–32)
AST: 18 IU/L (ref 0–40)
Albumin/Globulin Ratio: 1.4 (ref 1.2–2.2)
Albumin: 4.7 g/dL (ref 3.9–5.0)
Alkaline Phosphatase: 68 IU/L (ref 44–121)
BUN/Creatinine Ratio: 10 (ref 9–23)
BUN: 8 mg/dL (ref 6–20)
Bilirubin Total: 0.4 mg/dL (ref 0.0–1.2)
CO2: 23 mmol/L (ref 20–29)
Calcium: 10.3 mg/dL — ABNORMAL HIGH (ref 8.7–10.2)
Chloride: 98 mmol/L (ref 96–106)
Creatinine, Ser: 0.84 mg/dL (ref 0.57–1.00)
Globulin, Total: 3.3 g/dL (ref 1.5–4.5)
Glucose: 82 mg/dL (ref 65–99)
Potassium: 4.5 mmol/L (ref 3.5–5.2)
Sodium: 137 mmol/L (ref 134–144)
Total Protein: 8 g/dL (ref 6.0–8.5)
eGFR: 101 mL/min/{1.73_m2} (ref >59)

## 2020-09-29 LAB — LIPID PANEL
Chol/HDL Ratio: 2.5 ratio (ref 0.0–4.4)
Cholesterol, Total: 172 mg/dL (ref 100–199)
HDL: 69 mg/dL (ref >39)
LDL Chol Calc (NIH): 91 mg/dL (ref 0–99)
Triglycerides: 64 mg/dL (ref 0–149)
VLDL Cholesterol Cal: 12 mg/dL (ref 5–40)

## 2020-09-29 LAB — HCG, SERUM, QUALITATIVE: hCG,Beta Subunit,Qual,Serum: NEGATIVE m[IU]/mL (ref <6)

## 2020-09-29 MED ORDER — ISOTRETINOIN 20 MG PO CAPS: 20.0000 mg | ORAL_CAPSULE | Freq: Every day | ORAL | 0 refills | Status: DC

## 2020-09-29 MED ORDER — CLINDAMYCIN PHOSPHATE 1 % EX SOLN: Freq: Two times a day (BID) | CUTANEOUS | 2 refills | Status: DC

## 2020-09-29 NOTE — Telephone Encounter (Signed)
Called patient and informed of lab results and ok to start isotretinoin. Patient confirmed in ipledge and medication sent to [facility].

## 2020-09-29 NOTE — Telephone Encounter (Signed)
Solution sent in and patient called into office to be advised of medication change.

## 2020-09-29 NOTE — Telephone Encounter (Signed)
-----   Message from Sandi Mealy, MD sent at 09/29/2020  8:30 AM EDT ----- Labs ok start isotretinoin

## 2020-10-04 ENCOUNTER — Encounter: Payer: Self-pay | Admitting: Dermatology

## 2020-10-10 ENCOUNTER — Other Ambulatory Visit: Payer: Self-pay

## 2020-10-10 ENCOUNTER — Encounter (HOSPITAL_COMMUNITY): Payer: Self-pay

## 2020-10-10 ENCOUNTER — Ambulatory Visit (HOSPITAL_COMMUNITY)
Admission: EM | Admit: 2020-10-10 | Discharge: 2020-10-10 | Disposition: A | Discharge status: Home / Self Care | Payer: Medicaid Other | Attending: Physician Assistant | Admitting: Physician Assistant

## 2020-10-10 DIAGNOSIS — B9689 Other specified bacterial agents as the cause of diseases classified elsewhere: Secondary | ICD-10-CM | POA: Diagnosis not present

## 2020-10-10 DIAGNOSIS — J038 Acute tonsillitis due to other specified organisms: Secondary | ICD-10-CM | POA: Insufficient documentation

## 2020-10-10 DIAGNOSIS — J029 Acute pharyngitis, unspecified: Secondary | ICD-10-CM

## 2020-10-10 LAB — POCT INFECTIOUS MONO SCREEN, ED / UC: Mono Screen: NEGATIVE

## 2020-10-10 LAB — POCT RAPID STREP A, ED / UC: Streptococcus, Group A Screen (Direct): NEGATIVE

## 2020-10-10 MED ORDER — AMOXICILLIN-POT CLAVULANATE 875-125 MG PO TABS: 1.0000 | ORAL_TABLET | Freq: Two times a day (BID) | ORAL | 0 refills | Status: DC

## 2020-10-10 NOTE — ED Provider Notes (Signed)
[facility]    CSN: [id number] Arrival date & time: 10/10/20  1400      History   Chief Complaint Chief Complaint  Patient presents with  . Sore Throat    HPI Sharon Steele is a 20 y.o. female.   Patient presents today with with a several day history of severe sore throat.  She reports associated lymphadenopathy as well as odynophagia.  She denies measured fever but does report some subjective fever.  She is able to eat and drink despite symptoms.  Reports pain is rated 5 on a 0-10 pain scale, localized to posterior oropharynx, significantly worse with swallowing or speaking, no alleviating factors identified.  Denies any voice changes, muffled voice, shortness of breath.  She denies cough, congestion, chest pain, shortness of breath.  She denies history of strep throat infections.  Denies history of allergies.  She has tried DayQuil without improvement of symptoms.  Denies any known sick contacts but does report she recently travel to [facility].  She is up-to-date on COVID-vaccine but has not had booster.     Past Medical History:  Diagnosis Date  . HSV (herpes simplex virus) infection     Patient Active Problem List   Diagnosis Date Noted  . 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 Menorrhagia with regular cycle 03/18/2020  . 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 Hidradenitis 03/18/2020    History reviewed. No pertinent surgical history.  OB History    Gravida  0   Para  0   Term  0   Preterm  0   AB  0   Living  0     SAB  0   IAB  0   Ectopic  0   Multiple  0   Live Births  0            Home Medications    Prior to Admission medications   Medication Sig Start Date End Date Taking? Authorizing Provider  amoxicillin-clavulanate (AUGMENTIN) 875-125 MG tablet Take 1 tablet by mouth every 12 (twelve) hours. 10/10/20  Yes Polly Barner K, PA-C  ibuprofen (ADVIL) 200 MG tablet Take 200 mg by mouth every 6 (six) hours as  needed.   Yes [provider]  Pseudoephedrine-APAP-DM (DAYQUIL PO) Take by mouth.   Yes [provider]  norgestimate-ethinyl estradiol (ORTHO-CYCLEN) 0.25-35 MG-MCG tablet Take 1 tablet by mouth daily. 06/22/20   [name], MD  spironolactone (ALDACTONE) 100 MG tablet Take 1 tablet (100 mg total) by mouth at bedtime. 08/25/20   [name], [name], MD  valACYclovir (VALTREX) 1000 MG tablet Take 1 tablet (1,000 mg total) by mouth daily. 03/18/20   [name], MD  cetirizine (ZYRTEC) 10 MG tablet Take 1 tablet (10 mg total) by mouth daily. For 3 days 09/06/16 11/24/19  Ree Shay, MD  ipratropium (ATROVENT) 0.06 % nasal spray Place 2 sprays into both nostrils 4 (four) times daily. Patient not taking: Reported on 09/06/2016 07/04/16 11/24/19  Deatra Canter, FNP    Family History Family History  Problem Relation Age of Onset  . Cancer Mother   . Hypertension Father   . Hypertension Paternal Grandfather   . Hypertension Paternal Grandmother   . Diabetes Maternal Grandmother   . Diabetes Maternal Grandfather   . Hypertension Paternal Aunt   . Multiple sclerosis Paternal Aunt     Social History Social History   Tobacco Use  . Smoking status: Never Smoker  . Smokeless  tobacco: Never Used  Vaping Use  . Vaping Use: Never used  Substance Use Topics  . Alcohol use: Yes  . Drug use: Not Currently     Allergies   Cashew nut oil and Almond oil   Review of Systems Review of Systems  Constitutional: Positive for fatigue and fever. Negative for activity change and appetite change.  HENT: Positive for sore throat and trouble swallowing (due to pain). Negative for congestion, sinus pressure, sneezing and voice change.   Respiratory: Negative for cough and shortness of breath.   Cardiovascular: Negative for chest pain.  Gastrointestinal: Negative for abdominal pain, diarrhea, nausea and vomiting.  Musculoskeletal: Negative for arthralgias and myalgias.  Neurological:  Positive for headaches. Negative for dizziness and light-headedness.     Physical Exam Triage Vital Signs ED Triage Vitals  Enc Vitals Group     BP 10/10/20 1533 125/84     Pulse --      Resp 10/10/20 1533 18     Temp 10/10/20 1533 98.5 F (36.9 C)     Temp Source 10/10/20 1533 Oral     SpO2 10/10/20 1533 100 %     Weight --      Height --      Head Circumference --      Peak Flow --      Pain Score 10/10/20 1529 5     Pain Loc --      Pain Edu? --      Excl. in GC? --    No data found.  Updated Vital Signs BP 125/84 (BP Location: Right Arm)   Temp 98.5 F (36.9 C) (Oral)   Resp 18   LMP  (Within Months) Comment: 1 month   SpO2 100%   Visual Acuity Right Eye Distance:   Left Eye Distance:   Bilateral Distance:    Right Eye Near:   Left Eye Near:    Bilateral Near:     Physical Exam Vitals reviewed.  Constitutional:      General: She is awake. She is not in acute distress.    Appearance: Normal appearance. She is not ill-appearing.     Comments: Very pleasant female appears stated age in no acute distress  HENT:     Head: Normocephalic and atraumatic.     Right Ear: Tympanic membrane, ear canal and external ear normal. Tympanic membrane is not erythematous or bulging.     Left Ear: Tympanic membrane, ear canal and external ear normal. Tympanic membrane is not erythematous or bulging.     Nose:     Right Sinus: No maxillary sinus tenderness or frontal sinus tenderness.     Left Sinus: No maxillary sinus tenderness or frontal sinus tenderness.     Mouth/Throat:     Pharynx: Uvula midline. Oropharyngeal exudate and posterior oropharyngeal erythema present.     Tonsils: No tonsillar exudate or tonsillar abscesses. 1+ on the right. 1+ on the left.     Comments: Significant erythema over tonsils and posterior oropharynx.  No exudate noted.  No abscess noted Cardiovascular:     Rate and Rhythm: Normal rate and regular rhythm.     Heart sounds: No murmur  heard.   Pulmonary:     Effort: Pulmonary effort is normal.     Breath sounds: Normal breath sounds. No wheezing, rhonchi or rales.     Comments: Clear to auscultation bilaterally Abdominal:     General: Bowel sounds are normal.     Palpations: Abdomen is  soft.     Tenderness: There is no abdominal tenderness.  Lymphadenopathy:     Head:     Right side of head: No submental, submandibular or tonsillar adenopathy.     Left side of head: No submental, submandibular or tonsillar adenopathy.     Cervical: No cervical adenopathy.  Psychiatric:        Behavior: Behavior is cooperative.      UC Treatments / Results  Labs (all labs ordered are listed, but only abnormal results are displayed) Labs Reviewed  CULTURE, GROUP A STREP [facility])  POCT RAPID STREP A, ED / UC  POCT INFECTIOUS MONO SCREEN, ED / UC    EKG   Radiology No results found.  Procedures Procedures (including critical care time)  Medications Ordered in UC Medications - No data to display  Initial Impression / Assessment and Plan / UC Course  I have reviewed the triage vital signs and the nursing notes.  Pertinent labs & imaging results that were available during my care of the patient were reviewed by me and considered in my medical decision making (see chart for details).      Centor score 3; rapid strep negative in office today.  Throat culture obtained.  Mono was negative.  Given clinical presentation will treat with Augmentin for bacterial tonsillitis.  She can alternate Tylenol and ibuprofen for symptom relief.  Discussed the importance of drinking plenty of fluid to avoid dehydration.  Recommended she throw away toothbrush 24 hours after beginning antibiotics to prevent reinfection.  Discussed alarm symptoms that warrant emergent evaluation.  Strict return precautions given to which patient expressed understanding.  Final Clinical Impressions(s) / UC Diagnoses   Final diagnoses:  Sore throat  Acute  pharyngitis, unspecified etiology  Acute bacterial tonsillitis     Discharge Instructions     Your monotest was negative.  Your strep was negative but given your clinical presentation I am going to start you on antibiotics.  Please take this twice a day for 7 days.  We will contact you if we need to make any adjustments to your treatment.  Alternate Tylenol ibuprofen for your pain relief.  Make sure you are drinking plenty of fluid.  If you have any muffled voice, difficulty swallowing, shortness of breath, high fever, worsening pain you need to go to the emergency room.  Follow-up with PCP in 1 week.    ED Prescriptions    Medication Sig Dispense Auth. Provider   amoxicillin-clavulanate (AUGMENTIN) 875-125 MG tablet Take 1 tablet by mouth every 12 (twelve) hours. 14 tablet Annissa Andreoni, Noberto Retort, PA-C     PDMP not reviewed this encounter.   Jeani Hawking, PA-C 10/10/20 1700

## 2020-10-10 NOTE — Discharge Instructions (Addendum)
Your monotest was negative.  Your strep was negative but given your clinical presentation I am going to start you on antibiotics.  Please take this twice a day for 7 days.  We will contact you if we need to make any adjustments to your treatment.  Alternate Tylenol ibuprofen for your pain relief.  Make sure you are drinking plenty of fluid.  If you have any muffled voice, difficulty swallowing, shortness of breath, high fever, worsening pain you need to go to the emergency room.  Follow-up with PCP in 1 week.

## 2020-10-10 NOTE — ED Triage Notes (Signed)
Pt reports sore throat and swollen lymph nodes around in the neck. States she feels she has something stuck in the throat and postnasal mucus; and spiting tonsil stones..  Advil and DayQuil gives some relief.

## 2020-10-12 LAB — CULTURE, GROUP A STREP (THRC)

## 2020-10-20 DIAGNOSIS — Z419 Encounter for procedure for purposes other than remedying health state, unspecified: Secondary | ICD-10-CM | POA: Diagnosis not present

## 2020-11-04 ENCOUNTER — Ambulatory Visit (INDEPENDENT_AMBULATORY_CARE_PROVIDER_SITE_OTHER): Payer: Medicaid Other | Admitting: Dermatology

## 2020-11-04 ENCOUNTER — Other Ambulatory Visit: Payer: Self-pay

## 2020-11-04 DIAGNOSIS — L700 Acne vulgaris: Secondary | ICD-10-CM | POA: Diagnosis not present

## 2020-11-04 DIAGNOSIS — L732 Hidradenitis suppurativa: Secondary | ICD-10-CM | POA: Diagnosis not present

## 2020-11-04 DIAGNOSIS — L720 Epidermal cyst: Secondary | ICD-10-CM | POA: Diagnosis not present

## 2020-11-04 NOTE — Progress Notes (Addendum)
Follow-Up Visit   Subjective  Sharon Steele is a 20 y.o. female who presents for the following: Acne and acne vulgaris (Patient here today for follow up on acne. She reports that she was not able to start isotretinoin. She states acne is doing about the same. ).  Patient states hidradenitis at left medial thigh seems to be doing better. She is not able to use clindamycin lotion twice daily. Patient finished azithromycin 500 mg. Patient reports she is not taking spironolactone 100 mg tablet by mouth daily. She takes it every now and then.  She reports a few spots that are especially tender.  The following portions of the chart were reviewed this encounter and updated as appropriate:  Tobacco  Allergies  Meds  Problems  Med Hx  Surg Hx  Fam Hx       Objective  Well appearing patient in no apparent distress; mood and affect are within normal limits.  A focused examination was performed including face, neck, chest and back and inframammary, axillae and inguinal folds. Relevant physical exam findings are noted in the Assessment and Plan.  Head - Anterior (Face) Trace to 1 + open inflammatory papules and scattered open comedones and some scars at face, chest and back are clear   pubic and left breast Erythematous deep papules  Left Medial Thigh Few inflammatory papules, rare inflammatory cyst at inframammary and inguinal folds  Assessment & Plan  Acne vulgaris Head - Anterior (Face)  Chronic condition with duration over one year. Condition is bothersome to patient. Currently flared.  Continue Spironolactone 100 mg by mouth daily  HCG urine test ordered - patient was unable to complete urine test in office today. Start Absorica 20mg  1 PO QD with fatty meal pending negative pregnancy test  While taking Isotretinoin and for 30 days after you finish the medication, do not get pregnant, do not share pills, do not donate blood. Isotretinoin is best absorbed when taken with a fatty  meal. Isotretinoin can make you sensitive to the sun. Daily careful sun protection including sunscreen SPF 30+ when outdoors is recommended.    HCG, Qualitative - Head - Anterior (Face)  Epidermal cyst pubic and left breast  Intralesional steroid injection side effects were reviewed including thinning of the skin and discoloration, such as redness, lightening or darkening.   Intralesional injection - pubic and left breast Location: pubic and left breast   Informed Consent: Discussed risks (infection, pain, bleeding, bruising, thinning of the skin, loss of skin pigment, lack of resolution, and recurrence of lesion) and benefits of the procedure, as well as the alternatives. Informed consent was obtained. Preparation: The area was prepared a standard fashion.  Procedure Details: An intralesional injection was performed with Kenalog 3.3 mg/cc. 0.2 cc in total were injected.  Total number of injections: 3  Plan: The patient was instructed on post-op care. Recommend OTC analgesia as needed for pain.   Hidradenitis suppurativa Left Medial Thigh  Continue spiro try to increase to daily   Stop clindamycin  since not tollerating well  Start Hibiclens or benzoyl peroxide wash     Related Medications spironolactone (ALDACTONE) 100 MG tablet Take 1 tablet (100 mg total) by mouth at bedtime.  Return in about 1 month (around 12/04/2020) for acne follow up. I, 12/06/2020, CMA, am acting as scribe for Asher Muir, MD.  Documentation: I have reviewed the above documentation for accuracy and completeness, and I agree with the above.  Darden Dates, MD

## 2020-11-04 NOTE — Patient Instructions (Addendum)
Spironolactone can cause increased urination and cause blood pressure to decrease. Please watch for signs of lightheadedness and be cautious when changing position. It can sometimes cause breast tenderness or an irregular period in premenopausal women. It can also increase potassium. The increase in potassium usually is not a concern unless you are taking other medicines that also increase potassium, so please be sure your doctor knows all of the other medications you are taking. This medication should not be taken by pregnant women.  This medicine should also not be taken together with sulfa drugs like Bactrim (trimethoprim/sulfamethexazole).    While taking Isotretinoin and for 30 days after you finish the medication, do not get pregnant, do not share pills, do not donate blood. Isotretinoin is best absorbed when taken with a fatty meal. Isotretinoin can make you sensitive to the sun. Daily careful sun protection including sunscreen SPF 30+ when outdoors is recommended.    If you have any questions or concerns for your doctor, please call our main line at [phone] and press option 4 to reach your doctor's medical assistant. If no one answers, please leave a voicemail as directed and we will return your call as soon as possible. Messages left after 4 pm will be answered the following business day.   You may also send [place] a message via MyChart. We typically respond to MyChart messages within 1-2 business days.  For prescription refills, please ask your pharmacy to contact our office. Our fax number is [phone].  If you have an urgent issue when the clinic is closed that cannot wait until the next business day, you can page your doctor at the number below.    Please note that while we do our best to be available for urgent issues outside of office hours, we are not available 24/7.   If you have an urgent issue and are unable to reach [place], you may choose to seek medical care at your doctor's  office, retail clinic, urgent care center, or emergency room.  If you have a medical emergency, please immediately call 911 or go to the emergency department.  Pager Numbers  - Dr. Gwen Pounds: [phone]  - Dr. [name]: [phone]  - Dr. Roseanne Reno: [phone]  In the event of inclement weather, please call our main line at [phone] for an update on the status of any delays or closures.  Dermatology Medication Tips: Please keep the boxes that topical medications come in in order to help keep track of the instructions about where and how to use these. Pharmacies typically print the medication instructions only on the boxes and not directly on the medication tubes.   If your medication is too expensive, please contact our office at [phone] option 4 or send [place] a message through MyChart.   We are unable to tell what your co-pay for medications will be in advance as this is different depending on your insurance coverage. However, we may be able to find a substitute medication at lower cost or fill out paperwork to get insurance to cover a needed medication.   If a prior authorization is required to get your medication covered by your insurance company, please allow [place] 1-2 business days to complete this process.  Drug prices often vary depending on where the prescription is filled and some pharmacies may offer cheaper prices.  The website www.goodrx.com contains coupons for medications through different pharmacies. The prices here do not account for what the cost may be with help from insurance (it may be  cheaper with your insurance), but the website can give you the price if you did not use any insurance.  - You can print the associated coupon and take it with your prescription to the pharmacy.  - You may also stop by our office during regular business hours and pick up a GoodRx coupon card.  - If you need your prescription sent electronically to a different pharmacy, notify our office  through [facility] or by phone at [phone] option 4.

## 2020-11-05 ENCOUNTER — Encounter: Payer: Self-pay | Admitting: Dermatology

## 2020-11-05 LAB — HCG, SERUM, QUALITATIVE: hCG,Beta Subunit,Qual,Serum: NEGATIVE m[IU]/mL (ref <6)

## 2020-11-05 MED ORDER — ISOTRETINOIN 20 MG PO CAPS: 20.0000 mg | ORAL_CAPSULE | Freq: Every day | ORAL | 0 refills | Status: DC

## 2020-11-08 ENCOUNTER — Telehealth: Payer: Self-pay

## 2020-11-08 NOTE — Telephone Encounter (Signed)
-----   Message from Sandi Mealy, MD sent at 11/05/2020  4:25 PM EDT ----- Patient not pregnant. Confirmed in iPledge and prescription sent to [facility].   MAs please confirm patient received message on MyChart and filled prescription. Thank you!

## 2020-11-08 NOTE — Telephone Encounter (Signed)
LVM for patient to call office to confirm rx received, Sharon Steele

## 2020-11-08 NOTE — Telephone Encounter (Signed)
-----   Message from Virginia [name], MD sent at 11/05/2020  4:25 PM EDT ----- Patient not pregnant. Confirmed in iPledge and prescription sent to [facility].   MAs please confirm patient received message on MyChart and filled prescription. Thank you!   

## 2020-11-09 NOTE — Telephone Encounter (Signed)
Patient advised of information per Dr. [name]. She has answered her questions and RX was delivered today.

## 2020-11-16 ENCOUNTER — Other Ambulatory Visit: Payer: Self-pay

## 2020-11-16 ENCOUNTER — Other Ambulatory Visit: Payer: Self-pay | Admitting: Obstetrics and Gynecology

## 2020-11-16 NOTE — Patient Outreach (Signed)
Medicaid Managed Care   Nurse Care Manager Note  11/16/2020 Name:  Sharon Steele MRN:  [id number] DOB:  2000-07-18  Sharon Steele is an 20 y.o. year old female who is a primary patient of [facility].  The [facility] Managed Care Coordination team was consulted for assistance with:    Healthcare management needs.  Sharon Steele was given information about Medicaid Managed Care Coordination team services today. Sharon Steele agreed to services and verbal consent obtained.  Engaged with patient by telephone for initial visit in response to provider referral for case management and/or care coordination services.   Assessments/Interventions:  Review of past medical history, allergies, medications, health status, including review of consultants reports, laboratory and other test data, was performed as part of comprehensive evaluation and provision of chronic care management services.  SDOH (Social Determinants of Health) assessments and interventions performed:   Care Plan  Allergies  Allergen Reactions   Cashew Nut Oil Hives, Itching and Swelling   Almond Oil     Medications Reviewed Today     Reviewed by Sandi Mealy, MD (Physician) on 11/05/20 at 1620  Med List Status: <None>   Medication Order Taking? Sig Documenting Provider Last Dose Status Informant  amoxicillin-clavulanate (AUGMENTIN) 875-125 MG tablet [id number] Yes Take 1 tablet by mouth every 12 (twelve) hours. Jeani Hawking, PA-C Taking Active     Discontinued 11/24/19 1034   ibuprofen (ADVIL) 200 MG tablet [id number] Yes Take 200 mg by mouth every 6 (six) hours as needed. [provider] Taking Active    Patient not taking:   Discontinued 11/24/19 1034 norgestimate-ethinyl estradiol (ORTHO-CYCLEN) 0.25-35 MG-MCG tablet [id number] Yes Take 1 tablet by mouth daily. [name], MD Taking Active   Pseudoephedrine-APAP-DM (DAYQUIL PO) [id number] Yes Take by mouth. [provider]  Taking Active   spironolactone (ALDACTONE) 100 MG tablet [id number] Yes Take 1 tablet (100 mg total) by mouth at bedtime. [name], [name], MD Taking Active   valACYclovir (VALTREX) 1000 MG tablet [id number] Yes Take 1 tablet (1,000 mg total) by mouth daily. 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  Care Plan : General Plan of Care (Adult)  Updates made by [name], RN since 11/16/2020 12:00 AM     Problem: Health Promotion or Disease Self-Management (General Plan of Care)   Priority: Low  Onset Date: 11/16/2020     Long-Range Goal: Self-Management Plan Developed   Start Date: 11/16/2020  Expected End Date: 02/16/2021  This Visit's Progress: On track  Priority: Low  Note:    Current Barriers:  Knowledge Deficits related to short term plan for care coordination needs and long term plans for chronic disease management needs.  Patient has started new medication for acne-also has hidradenitis. Nurse Case Manager Clinical Goal(s):  patient will work with care management team to address care coordination and chronic disease management needs related to Disease Management Educational Needs Care Coordination Medication Management and Education Medication Reconciliation Medication Assistance  Psychosocial Support   Interventions:  Evaluation of current treatment plan and patient's adherence to plan as established by provider. Advised patient to  contact Dermatologist , Dr. [name], about medication. Reviewed medications with patient. Discussed plans with patient for ongoing care management follow up and provided patient with direct contact information for care management  team Reviewed scheduled/upcoming provider appointments. Self Care Activities:  Patient will self administer medications as prescribed Patient will attend all scheduled provider appointments Patient will call pharmacy for medication refills Patient will continue to perform ADL's independently Patient will call provider office for new concerns or questions Patient Goals: In the next 90 days, patient will attend all scheduled appointments. - Follow Up Plan: RNCM will follow up with patient within 30 days.  Patient has been provided with contact information.   Evidence-based guidance:   Review biopsychosocial determinants of health screens.  Review need for preventive screening based on age, sex, family history and health history.  Determine level of modifiable health risk.  Discuss identified risks.  Identify areas where behavior change may lead to improved health.  Promote healthy lifestyle.  Evoke change talk using open-ended questions, pros and cons, as well as looking forward.  Identify and manage conditions or preconditions to reduce health risk.  Implement additional goals and interventions based on identified risk factors.     Follow Up:  Patient agrees to Care Plan and Follow-up.  Plan: The Managed Medicaid care management team will reach out to the patient again over the next 30 days. and The patient has been provided with contact information for the Managed Medicaid care management team and has been advised to call with any health related questions or concerns.  Date/time of next scheduled RN care management/care coordination outreach: 12/16/20 at 1145.

## 2020-11-16 NOTE — Patient Instructions (Signed)
Hi Ms. Kober, thank you for speaking with me today.  Ms. Rosado was given information about Medicaid Managed Care team care coordination services as a part of their [facility] Medicaid benefit. JULIE NAY verbally consented to engagement with the [facility] Managed Care team.   For questions related to your [facility] health plan, please call: [phone] or go here:[email or web address]  If you would like to schedule transportation through your [facility] plan, please call the following number at least 2 days in advance of your appointment: [phone].  Call the [facility] Crisis Line at [phone], at any time, 24 hours a day, 7 days a week. If you are in danger or need immediate medical attention call 911.  Ms. Rickles - following are the goals we discussed in your visit today:   Goals Addressed             This Visit's Progress    Protect My Health       Timeframe:  Long-Range Goal Priority:  Low Start Date:    11/16/20                         Expected End Date:    ongoing                   Follow Up Date 12/16/2020    - practice safe sex - schedule appointment for flu shot - schedule appointment for vaccines needed due to my age or health - schedule recommended health tests (blood work, mammogram, colonoscopy, pap test) - schedule and keep appointment for annual check-up    Why is this important?   Screening tests can find diseases early when they are easier to treat.  Your doctor or nurse will talk with you about which tests are important for you.  Getting shots for common diseases like the flu and shingles will help prevent them.       Patient verbalizes understanding of instructions provided today.   The Managed Medicaid care management team will reach out to the patient again over the next 30 days.  The patient has been provided with contact information for the Managed Medicaid care management team and has been advised to call with  any health related questions or concerns.   Kathi Der RN, BSN [place]  [employer] - Managed Medicaid High Risk [phone].   Following is a copy of your plan of care:  Patient Care Plan: General Plan of Care (Adult)     Problem Identified: Health Promotion or Disease Self-Management (General Plan of Care)   Priority: Low  Onset Date: 11/16/2020     Long-Range Goal: Self-Management Plan Developed   Start Date: 11/16/2020  Expected End Date: 02/16/2021  This Visit's Progress: On track  Priority: Low  Note:    Current Barriers:  Knowledge Deficits related to short term plan for care coordination needs and long term plans for chronic disease management needs.  Patient has started new medication for acne-also has hidradenitis. Nurse Case Manager Clinical Goal(s):  patient will work with care management team to address care coordination and chronic disease management needs related to Disease Management Educational Needs Care Coordination Medication Management and Education Medication Reconciliation Medication Assistance  Psychosocial Support   Interventions:  Evaluation of current treatment plan and patient's adherence to plan as established by provider. Advised patient to contact Dermatologist , Dr. [name], about medication. Reviewed medications with patient. Discussed plans with patient for  ongoing care management follow up and provided patient with direct contact information for care management team Reviewed scheduled/upcoming provider appointments. Self Care Activities:  Patient will self administer medications as prescribed Patient will attend all scheduled provider appointments Patient will call pharmacy for medication refills Patient will continue to perform ADL's independently Patient will call provider office for new concerns or questions Patient Goals: In the next 90 days, patient will attend all scheduled  appointments. - Follow Up Plan: RNCM will follow up with patient within 30 days.  Patient has been provided with contact information.   Evidence-based guidance:   Review biopsychosocial determinants of health screens.  Review need for preventive screening based on age, sex, family history and health history.  Determine level of modifiable health risk.  Discuss identified risks.  Identify areas where behavior change may lead to improved health.  Promote healthy lifestyle.  Evoke change talk using open-ended questions, pros and cons, as well as looking forward.  Identify and manage conditions or preconditions to reduce health risk.  Implement additional goals and interventions based on identified risk factors.

## 2020-11-18 ENCOUNTER — Telehealth: Payer: Self-pay

## 2020-11-18 NOTE — Telephone Encounter (Signed)
Please confirm she is taking isotretinoin and spironolactone both as directed (very important to take every day). I also recommend using hibiclens or benzoyl peroxide wash daily (recommended at last visit). If so, stop azithromycin and start bactrim DS 800 mg-160 mg twice a day for 7 days. If she is not feeling better by next Tuesday she should call or send a MyChart message. Thank you!

## 2020-11-18 NOTE — Telephone Encounter (Signed)
Patient advised of information per Dr. [name]. She has not been using the Hibiclens or BP wash. Will start using. If no better she will call me next Tuesday.

## 2020-11-18 NOTE — Telephone Encounter (Signed)
Patient called regarding a current HS flare. She states the flares are very painful and not allowing her to rest or sleep. She states she has also been taking azithromycin.

## 2020-11-19 DIAGNOSIS — Z419 Encounter for procedure for purposes other than remedying health state, unspecified: Secondary | ICD-10-CM | POA: Diagnosis not present

## 2020-12-06 ENCOUNTER — Other Ambulatory Visit: Payer: Self-pay

## 2020-12-06 ENCOUNTER — Ambulatory Visit (INDEPENDENT_AMBULATORY_CARE_PROVIDER_SITE_OTHER): Payer: Medicaid Other | Admitting: Dermatology

## 2020-12-06 VITALS — Wt 151.0 lb

## 2020-12-06 DIAGNOSIS — L732 Hidradenitis suppurativa: Secondary | ICD-10-CM | POA: Diagnosis not present

## 2020-12-06 DIAGNOSIS — K130 Diseases of lips: Secondary | ICD-10-CM | POA: Diagnosis not present

## 2020-12-06 DIAGNOSIS — Z79899 Other long term (current) drug therapy: Secondary | ICD-10-CM

## 2020-12-06 DIAGNOSIS — L853 Xerosis cutis: Secondary | ICD-10-CM | POA: Diagnosis not present

## 2020-12-06 DIAGNOSIS — L700 Acne vulgaris: Secondary | ICD-10-CM | POA: Diagnosis not present

## 2020-12-06 MED ORDER — ISOTRETINOIN 30 MG PO CAPS: 30.0000 mg | ORAL_CAPSULE | Freq: Every day | ORAL | 0 refills | Status: DC

## 2020-12-06 MED ORDER — SPIRONOLACTONE 100 MG PO TABS: 100.0000 mg | ORAL_TABLET | Freq: Every day | ORAL | 1 refills | Status: DC

## 2020-12-06 NOTE — Progress Notes (Signed)
Isotretinoin Follow-Up Visit   Subjective  Sharon Steele is a 20 y.o. female who presents for the following: Acne (Isotretinoin therapy, pt taking 20 mg once a day ).  Week # 4   Isotretinoin F/U - 12/06/20 1200       Isotretinoin Follow Up   Date 12/06/20    Weight 151 lb (68.5 kg)    Two Forms of Birth Control Oral Contraceptives (w/ estrogen);Female Condom    Acne breakouts since last visit? Yes      Dosage   Current (To Date) Dosage (mg) 600      Side Effects   Skin Chapped Lips;Dry Skin    Gastrointestinal WNL    Neurological WNL    Constitutional WNL              Side effects: Dry skin, dry lips  Denies changes in night vision, shortness of breath, abdominal pain, nausea, vomiting, diarrhea, blood in stool or urine, visual changes, headaches, epistaxis, joint pain, myalgias, mood changes, depression, or suicidal ideation.   Patient is not pregnant, not seeking pregnancy, and not breastfeeding.   The following portions of the chart were reviewed this encounter and updated as appropriate: medications, allergies, medical history  Review of Systems:  No other skin or systemic complaints except as noted in HPI or Assessment and Plan.  Objective  Well appearing patient in no apparent distress; mood and affect are within normal limits.  An examination of the face, neck, chest, and back was performed and relevant findings are noted below.   face 1 large nodule on the right cheek, scarring, spotty hyperpigmentation  pubic area Draining area at the left pubic area    Assessment & Plan   Acne vulgaris face  Severe; On Isotretinoin -  requiring FDA mandated monthly evaluations and laboratory monitoring; Chronic and Persistent; Not to Goal   Urine pregnancy test performed in office today and was negative.  Patient demonstrates comprehension and confirms she will not get pregnant.    [facility] pharmacy Ipledge [phone] Birth control tablets, female latex   condoms  Increase to Absorica 30 mg take 1 tablet daily   While taking Isotretinoin and for 30 days after you finish the medication, do not get pregnant, do not share pills, do not donate blood. Isotretinoin is best absorbed when taken with a fatty meal. Isotretinoin can make you sensitive to the sun. Daily careful sun protection including sunscreen SPF 30+ when outdoors is recommended.   Related Medications ISOtretinoin (ABSORICA) 30 MG capsule Take 1 capsule (30 mg total) by mouth daily.  Hidradenitis suppurativa pubic area  Hidradenitis Suppurativa is a chronic; persistent; non-curable, but treatable condition due to abnormal inflamed sweat glands in the body folds (axilla, inframammary, groin, medial thighs), causing recurrent painful cysts and scarring. It can be associated with severe scarring acne and cysts; abscesses and scarring of scalp. The goal is control and prevention of flares, as it is not curable. Scars are permanent and can be thickened. Treatment may include daily use of topical medication and oral antibiotics.  Oral isotretinoin may also be helpful.  For more severe cases, Humira (a biologic injection) may be prescribed to decrease the inflammatory process and prevent flares.  When indicated, inflamed cysts may also be treated surgically.   BP 123/79  Cont Spironolactone 100 mg take 1 tablet daily   Spironolactone can cause increased urination and cause blood pressure to decrease. Please watch for signs of lightheadedness and be cautious when changing position.  It can sometimes cause breast tenderness or an irregular period in premenopausal women. It can also increase potassium. The increase in potassium usually is not a concern unless you are taking other medicines that also increase potassium, so please be sure your doctor knows all of the other medications you are taking. This medication should not be taken by pregnant women.  This medicine should also not be taken together  with sulfa drugs like Bactrim (trimethoprim/sulfamethexazole).    Related Medications spironolactone (ALDACTONE) 100 MG tablet Take 1 tablet (100 mg total) by mouth at bedtime.   Xerosis secondary to isotretinoin therapy - Continue emollients as directed  Cheilitis secondary to isotretinoin therapy - Continue lip balm as directed, Dr. Clayborne Artist Cortibalm recommended  Long term medication management (isotretinoin) - While taking Isotretinoin and for 30 days after you finish the medication, do not get pregnant, do not share pills, do not donate blood. Isotretinoin is best absorbed when taken with a fatty meal. Isotretinoin can make you sensitive to the sun. Daily careful sun protection including sunscreen SPF 30+ when outdoors is recommended.  Follow-up in 30 days.  IAngelique Holm, CMA, am acting as scribe for Armida Sans, MD .  Documentation: I have reviewed the above documentation for accuracy and completeness, and I agree with the above.  Armida Sans, MD

## 2020-12-06 NOTE — Patient Instructions (Addendum)
While taking Isotretinoin and for 30 days after you finish the medication, do not get pregnant, do not share pills, do not donate blood. Isotretinoin is best absorbed when taken with a fatty meal. Isotretinoin can make you sensitive to the sun. Daily careful sun protection including sunscreen SPF 30+ when outdoors is recommended.  If you have any questions or concerns for your doctor, please call our main line at [phone] and press option 4 to reach your doctor's medical assistant. If no one answers, please leave a voicemail as directed and we will return your call as soon as possible. Messages left after 4 pm will be answered the following business day.   You may also send us a message via MyChart. We typically respond to MyChart messages within 1-2 business days.  For prescription refills, please ask your pharmacy to contact our office. Our fax number is [phone].  If you have an urgent issue when the clinic is closed that cannot wait until the next business day, you can page your doctor at the number below.    Please note that while we do our best to be available for urgent issues outside of office hours, we are not available 24/7.   If you have an urgent issue and are unable to reach us, you may choose to seek medical care at your doctor's office, retail clinic, urgent care center, or emergency room.  If you have a medical emergency, please immediately call 911 or go to the emergency department.  Pager Numbers  - Dr. Kowalski: [phone]  - Dr. [name]: [phone]  - Dr. Stewart: [phone]  In the event of inclement weather, please call our main line at [phone] for an update on the status of any delays or closures.  Dermatology Medication Tips: Please keep the boxes that topical medications come in in order to help keep track of the instructions about where and how to use these. Pharmacies typically print the medication instructions only on the boxes and not directly  on the medication tubes.   If your medication is too expensive, please contact our office at [phone] option 4 or send us a message through MyChart.   We are unable to tell what your co-pay for medications will be in advance as this is different depending on your insurance coverage. However, we may be able to find a substitute medication at lower cost or fill out paperwork to get insurance to cover a needed medication.   If a prior authorization is required to get your medication covered by your insurance company, please allow us 1-2 business days to complete this process.  Drug prices often vary depending on where the prescription is filled and some pharmacies may offer cheaper prices.  The website www.goodrx.com contains coupons for medications through different pharmacies. The prices here do not account for what the cost may be with help from insurance (it may be cheaper with your insurance), but the website can give you the price if you did not use any insurance.  - You can print the associated coupon and take it with your prescription to the pharmacy.  - You may also stop by our office during regular business hours and pick up a GoodRx coupon card.  - If you need your prescription sent electronically to a different pharmacy, notify our office through [facility] MyChart or by phone at [phone] option 4.  

## 2020-12-07 ENCOUNTER — Encounter: Payer: Self-pay | Admitting: Dermatology

## 2020-12-15 ENCOUNTER — Telehealth: Payer: Self-pay

## 2020-12-15 NOTE — Telephone Encounter (Signed)
Patient called with a few concerns:  1)She is having a significant HS flare under her breast, groin and buttock. Anything additional can she do for that? 2) Patient wants to know can she continue Kojic Acid Serum/wash. She was using before accutane to help with scarring. 3)She is wanting another tattoo and piercing at this time. Should she hold until accutane course is completed?  Patient missed her window last week for her accutane. I went over process in great detail with how this works for females with a  7 day window. Patient coming tomorrow to redo pregnancy test and appointment moved out one more week to August 30th.

## 2020-12-16 ENCOUNTER — Encounter: Payer: Self-pay | Admitting: Dermatology

## 2020-12-16 ENCOUNTER — Ambulatory Visit: Payer: Medicaid Other

## 2020-12-16 ENCOUNTER — Other Ambulatory Visit: Payer: Self-pay | Admitting: Obstetrics and Gynecology

## 2020-12-16 NOTE — Patient Outreach (Signed)
Care Coordination  12/16/2020  BRAYLEI TOTINO [birth year] [id number]   Medicaid Managed Care   Unsuccessful Outreach Note  12/16/2020 Name: CHELA SUTPHEN MRN: [id number] DOB: 2001/01/04  Referred by: [facility] Reason for referral : High Risk Managed Medicaid (Unsuccessful telephone outreach)   An unsuccessful telephone outreach was attempted today. The patient was referred to the case management team for assistance with care management and care coordination.   Follow Up Plan:  A member of the Managed Medicaid care management team will reach out to the patient again over the next 7-14 days.   Kathi Der RN, BSN [place]  [employer] - Managed Medicaid High Risk [phone].

## 2020-12-16 NOTE — Patient Instructions (Signed)
Hi Ms. Coalson, I hope you are doing well, sorry I missed you today- as a part of your Medicaid benefit, you are eligible for care management and care coordination services at no cost or copay. I was unable to reach you by phone today but would be happy to help you with your health related needs. Please feel free to call me at [phone].  A member of the Managed Medicaid care management team will reach out to you again over the next 7-14 days.   Kathi Der RN, BSN [place]  [employer] - Managed Medicaid High Risk [phone].

## 2020-12-20 DIAGNOSIS — Z419 Encounter for procedure for purposes other than remedying health state, unspecified: Secondary | ICD-10-CM | POA: Diagnosis not present

## 2020-12-20 NOTE — Telephone Encounter (Signed)
Making sure you saw the questions from last week. Thank you

## 2020-12-21 NOTE — Telephone Encounter (Signed)
MyChart message has been sent to patient regarding response.

## 2020-12-21 NOTE — Telephone Encounter (Signed)
I am sorry - thank you for following up. Please see below.   Patient called with a few concerns:   1)She is having a significant HS flare under her breast, groin and buttock. Anything additional can she do for that? --> Please confirm she is using spironolactone as prescribed. We can add Bactrim DS twice a day for 1 week as well provided there are no medication interactions with  new medications (it is ok with her isotretinoin).  2) Patient wants to know can she continue Kojic Acid Serum/wash. She was using before accutane to help with scarring. --> Yes, that is no problem.   3)She is wanting another tattoo and piercing at this time. Should she hold until accutane course is completed? --> I recommend waiting at least 6 months, ideally 1 year after finishing her treatment.    Patient missed her window last week for her accutane. I went over process in great detail with how this works for females with a  7 day window. Patient coming tomorrow to redo pregnancy test and appointment moved out one more week to August 30th. --> Thank you!

## 2021-01-11 ENCOUNTER — Ambulatory Visit: Payer: Medicaid Other | Admitting: Dermatology

## 2021-01-11 ENCOUNTER — Other Ambulatory Visit: Payer: Self-pay

## 2021-01-11 ENCOUNTER — Other Ambulatory Visit: Payer: Self-pay | Admitting: Obstetrics and Gynecology

## 2021-01-11 NOTE — Patient Instructions (Signed)
Hi Sharon Steele, thank you for speaking with me today.  Sharon Steele was given information about Medicaid Managed Care team care coordination services as a part of their [facility] Medicaid benefit. Sharon Steele verbally consented to engagement with the [facility] Managed Care team.   If you are experiencing a medical emergency, please call 911 or report to your local emergency department or urgent care.   If you have a non-emergency medical problem during routine business hours, please contact your provider's office and ask to speak with a nurse.   For questions related to your [facility] health plan, please call: [phone] or go here:[email or web address]  If you would like to schedule transportation through your [facility] plan, please call the following number at least 2 days in advance of your appointment: [phone].  Call the [facility] Crisis Line at [phone], at any time, 24 hours a day, 7 days a week. If you are in danger or need immediate medical attention call 911.  If you would like help to quit smoking, call 1-800-QUIT-NOW ((684)556-2202) OR Espaol: 1-855-Djelo-Ya (1-751-025-8527) o para ms informacin haga clic aqu or Text READY to 782-423 to register via text  Sharon Steele - following are the goals we discussed in your visit today:   Goals Addressed             This Visit's Progress    Protect My Health       Timeframe:  Long-Range Goal Priority:  Low Start Date:    11/16/20                         Expected End Date:    ongoing                   Follow Up Date:  02/11/21   - practice safe sex - schedule appointment for flu shot - schedule appointment for vaccines needed due to my age or health - schedule recommended health tests (blood work, mammogram, colonoscopy, pap test) - schedule and keep appointment for annual check-up    Why is this important?   Screening tests can find diseases early when they are easier to treat.   Your doctor or nurse will talk with you about which tests are important for you.  Getting shots for common diseases like the flu and shingles will help prevent them.     Update 01/11/21:  Patient has appointment 01/18/21 with Dr. [name] for follow up-refill medications.        The patient verbalized understanding of instructions provided today and declined a print copy of patient instruction materials.   The Managed Medicaid care management team will reach out to the patient again over the next 30 days.  The  Patient                                              has been provided with contact information for the Managed Medicaid care management team and has been advised to call with any health related questions or concerns.   Sharon Der RN, BSN [place]  [employer], production - Managed Medicaid High Risk [phone].   Following is a copy of your plan of care:  Patient Care Plan: General Plan of Care (Adult)     Problem Identified: Health Promotion or Disease Self-Management (General Plan  of Care)   Priority: Low  Onset Date: 11/16/2020     Long-Range Goal: Self-Management Plan Developed   Start Date: 11/16/2020  Expected End Date: 02/16/2021  Recent Progress: On track  Priority: Low  Note:   Current Barriers:  Knowledge Deficits related to short term plan for care coordination needs and long term plans for chronic disease management needs.  Patient has started new medication for acne-also has hidradenitis.  Needs housing resources-living with Aunt right now, but would like to find her own place. Nurse Case Manager Clinical Goal(s):  patient will work with care management team to address care coordination and chronic disease management needs related to Disease Management Educational Needs Care Coordination Medication Management and Education Medication Reconciliation Medication Assistance  Psychosocial Support   Interventions:  Evaluation of  current treatment plan and patient's adherence to plan as established by provider. Advised patient to contact Dermatologist , Dr. [name], about medication. Update 01/11/21:  Patient has appointment 01/18/21. Reviewed medications with patient. Discussed plans with patient for ongoing care management follow up and provided patient with direct contact information for care management team Reviewed scheduled/upcoming provider appointments. BSW referral for housing resources. Self Care Activities:  Patient will self administer medications as prescribed Patient will attend all scheduled provider appointments Patient will call pharmacy for medication refills Patient will continue to perform ADL's independently Patient will call provider office for new concerns or questions Patient Goals: In the next 90 days, patient will attend all scheduled appointments. - Follow Up Plan: RNCM will follow up with patient within 30 days.  Patient has been provided with contact information.   Evidence-based guidance:   Review biopsychosocial determinants of health screens.  Review need for preventive screening based on age, sex, family history and health history.  Determine level of modifiable health risk.  Discuss identified risks.  Identify areas where behavior change may lead to improved health.  Promote healthy lifestyle.  Evoke change talk using open-ended questions, pros and cons, as well as looking forward.  Identify and manage conditions or preconditions to reduce health risk.  Implement additional goals and interventions based on identified risk factors.

## 2021-01-11 NOTE — Patient Outreach (Signed)
Medicaid Managed Care   Nurse Care Manager Note  01/11/2021 Name:  SOL ODOR MRN:  [id number] DOB:  2000/06/15  Sharon Steele is an 20 y.o. year old female who is a primary patient of [facility].  The [facility] Managed Care Coordination team was consulted for assistance with:    Chronic healthcare management needs .  Ms. Bannister was given information about Medicaid Managed Care Coordination team services today. Sharon Steele Patient agreed to services and verbal consent obtained.  Engaged with patient by telephone for follow up visit in response to provider referral for case management and/or care coordination services.   Assessments/Interventions:  Review of past medical history, allergies, medications, health status, including review of consultants reports, laboratory and other test data, was performed as part of comprehensive evaluation and provision of chronic care management services.  SDOH (Social Determinants of Health) assessments and interventions performed: SDOH Interventions    Flowsheet Row Most Recent Value  SDOH Interventions   Intimate Partner Violence Interventions Intervention Not Indicated  Transportation Interventions Intervention Not Indicated       Care Plan  Allergies  Allergen Reactions   Cashew Nut Oil Hives, Itching and Swelling   Almond Oil     Medications Reviewed Today     Reviewed by [name], RN (Registered Nurse) on 01/11/21 at 1109  Med List Status: <None>   Medication Order Taking? Sig Documenting Provider Last Dose Status Informant  amoxicillin-clavulanate (AUGMENTIN) 875-125 MG tablet [id number]  Take 1 tablet by mouth every 12 (twelve) hours.  Patient not taking: Reported on 11/16/2020   [name]  Active     Discontinued 11/24/19 1034   ibuprofen (ADVIL) 200 MG tablet [id number]  Take 200 mg by mouth every 6 (six) hours as needed.  Patient not taking: Reported on 11/16/2020   [provider]  Active    Patient not taking:   Discontinued 11/24/19 1034 ISOtretinoin (ABSORICA) 30 MG capsule [id number] No Take 1 capsule (30 mg total) by mouth daily.  Patient not taking: Reported on 01/11/2021   Deirdre Evener, MD Not Taking Active   norgestimate-ethinyl estradiol (ORTHO-CYCLEN) 0.25-35 MG-MCG tablet [id number] Yes Take 1 tablet by mouth daily. [name], MD Taking Active   Pseudoephedrine-APAP-DM (DAYQUIL PO) [id number]  Take by mouth.  Patient not taking: Reported on 11/16/2020   [provider]  Active   spironolactone (ALDACTONE) 100 MG tablet [id number] Yes Take 1 tablet (100 mg total) by mouth at bedtime. Deirdre Evener, MD Taking Active   valACYclovir (VALTREX) 1000 MG tablet [id number] Yes Take 1 tablet (1,000 mg total) by mouth daily. 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 Acne, hidradenitis.  Care Plan : General Plan of Care (Adult)  Updates made by [name], RN since 01/11/2021 12:00 AM     Problem: Health Promotion or Disease Self-Management (General Plan of Care)   Priority: Low  Onset Date: 11/16/2020     Long-Range Goal: Self-Management Plan Developed   Start Date: 11/16/2020  Expected End Date: 02/16/2021  Recent Progress: On track  Priority:  Low  Note:    Current Barriers:  Knowledge Deficits related to short term plan for care coordination needs and long term plans for chronic disease management needs.  Patient has started new medication for acne-also has hidradenitis.  Needs housing resources-living with Aunt right now, but would like to find her own place. Nurse Case Manager Clinical Goal(s):  patient will work  with care management team to address care coordination and chronic disease management needs related to Disease Management Educational Needs Care Coordination Medication Management and Education Medication Reconciliation Medication Assistance  Psychosocial Support   Interventions:  Evaluation of current treatment plan and patient's adherence to plan as established by provider. Advised patient to contact Dermatologist , Dr. [name], about medication. Update 01/11/21:  Patient has appointment 01/18/21. Reviewed medications with patient. Discussed plans with patient for ongoing care management follow up and provided patient with direct contact information for care management team Reviewed scheduled/upcoming provider appointments. BSW referral for housing resources. Self Care Activities:  Patient will self administer medications as prescribed Patient will attend all scheduled provider appointments Patient will call pharmacy for medication refills Patient will continue to perform ADL's independently Patient will call provider office for new concerns or questions Patient Goals: In the next 90 days, patient will attend all scheduled appointments. - Follow Up Plan: RNCM will follow up with patient within 30 days.  Patient has been provided with contact information.   Evidence-based guidance:   Review biopsychosocial determinants of health screens.  Review need for preventive screening based on age, sex, family history and health history.  Determine level of modifiable health risk.  Discuss identified risks.  Identify areas where behavior change may lead to improved health.  Promote healthy lifestyle.  Evoke change talk using open-ended questions, pros and cons, as well as looking forward.  Identify and manage conditions or preconditions to reduce health risk.  Implement additional goals and interventions based on identified risk factors.     Follow Up:  Patient agrees to Care Plan and  Follow-up.  Plan: The Managed Medicaid care management team will reach out to the patient again over the next 30 days. and The patient has been provided with contact information for the Managed Medicaid care management team and has been advised to call with any health related questions or concerns.  Date/time of next scheduled RN care management/care coordination outreach:  02/09/21 at 1030.

## 2021-01-12 ENCOUNTER — Telehealth: Payer: Self-pay | Admitting: Pediatrics

## 2021-01-12 NOTE — Telephone Encounter (Signed)
..   Medicaid Managed Care   Unsuccessful Outreach Note  01/12/2021 Name: Sharon Steele MRN: [id number] DOB: 12-14-2000  Referred by: [facility] Reason for referral : High Risk Managed Medicaid (I called the patient today to get her scheduled with the MM BSW. I left my name and number on her VM.)   An unsuccessful telephone outreach was attempted today. The patient was referred to the case management team for assistance with care management and care coordination.   Follow Up Plan: The care management team will reach out to the patient again over the next 7-14 days.   Weston Settle Care Guide, High Risk Medicaid Managed Care Embedded Care Coordination [facility]  [facility]

## 2021-01-18 ENCOUNTER — Encounter: Payer: Self-pay | Admitting: Dermatology

## 2021-01-18 ENCOUNTER — Other Ambulatory Visit: Payer: Self-pay

## 2021-01-18 ENCOUNTER — Ambulatory Visit (INDEPENDENT_AMBULATORY_CARE_PROVIDER_SITE_OTHER): Payer: Medicaid Other | Admitting: Dermatology

## 2021-01-18 VITALS — Wt 151.0 lb

## 2021-01-18 DIAGNOSIS — K130 Diseases of lips: Secondary | ICD-10-CM | POA: Diagnosis not present

## 2021-01-18 DIAGNOSIS — Z79899 Other long term (current) drug therapy: Secondary | ICD-10-CM

## 2021-01-18 DIAGNOSIS — L853 Xerosis cutis: Secondary | ICD-10-CM

## 2021-01-18 DIAGNOSIS — L732 Hidradenitis suppurativa: Secondary | ICD-10-CM | POA: Diagnosis not present

## 2021-01-18 DIAGNOSIS — L720 Epidermal cyst: Secondary | ICD-10-CM

## 2021-01-18 DIAGNOSIS — L700 Acne vulgaris: Secondary | ICD-10-CM

## 2021-01-18 MED ORDER — SPIRONOLACTONE 100 MG PO TABS: 100.0000 mg | ORAL_TABLET | Freq: Every day | ORAL | 1 refills | Status: DC

## 2021-01-18 MED ORDER — ISOTRETINOIN 40 MG PO CAPS: 40.0000 mg | ORAL_CAPSULE | Freq: Two times a day (BID) | ORAL | 0 refills | Status: DC

## 2021-01-18 NOTE — Patient Instructions (Signed)
While taking Isotretinoin and for 30 days after you finish the medication, do not get pregnant, do not share pills, do not donate blood. Isotretinoin is best absorbed when taken with a fatty meal. Isotretinoin can make you sensitive to the sun. Daily careful sun protection including sunscreen SPF 30+ when outdoors is recommended.  Spironolactone can cause increased urination and cause blood pressure to decrease. Please watch for signs of lightheadedness and be cautious when changing position. It can sometimes cause breast tenderness or an irregular period in premenopausal women. It can also increase potassium. The increase in potassium usually is not a concern unless you are taking other medicines that also increase potassium, so please be sure your doctor knows all of the other medications you are taking. This medication should not be taken by pregnant women.  This medicine should also not be taken together with sulfa drugs like Bactrim (trimethoprim/sulfamethexazole).   If you have any questions or concerns for your doctor, please call our main line at [phone] and press option 4 to reach your doctor's medical assistant. If no one answers, please leave a voicemail as directed and we will return your call as soon as possible. Messages left after 4 pm will be answered the following business day.   You may also send us a message via MyChart. We typically respond to MyChart messages within 1-2 business days.  For prescription refills, please ask your pharmacy to contact our office. Our fax number is [phone].  If you have an urgent issue when the clinic is closed that cannot wait until the next business day, you can page your doctor at the number below.    Please note that while we do our best to be available for urgent issues outside of office hours, we are not available 24/7.   If you have an urgent issue and are unable to reach us, you may choose to seek medical care at your doctor's office,  retail clinic, urgent care center, or emergency room.  If you have a medical emergency, please immediately call 911 or go to the emergency department.  Pager Numbers  - Dr. Kowalski: [phone]  - Dr. [name]: [phone]  - Dr. Stewart: [phone]  In the event of inclement weather, please call our main line at [phone] for an update on the status of any delays or closures.  Dermatology Medication Tips: Please keep the boxes that topical medications come in in order to help keep track of the instructions about where and how to use these. Pharmacies typically print the medication instructions only on the boxes and not directly on the medication tubes.   If your medication is too expensive, please contact our office at [phone] option 4 or send us a message through MyChart.   We are unable to tell what your co-pay for medications will be in advance as this is different depending on your insurance coverage. However, we may be able to find a substitute medication at lower cost or fill out paperwork to get insurance to cover a needed medication.   If a prior authorization is required to get your medication covered by your insurance company, please allow us 1-2 business days to complete this process.  Drug prices often vary depending on where the prescription is filled and some pharmacies may offer cheaper prices.  The website www.goodrx.com contains coupons for medications through different pharmacies. The prices here do not account for what the cost may be with help from insurance (it may be cheaper with your   insurance), but the website can give you the price if you did not use any insurance.  - You can print the associated coupon and take it with your prescription to the pharmacy.  - You may also stop by our office during regular business hours and pick up a GoodRx coupon card.  - If you need your prescription sent electronically to a different pharmacy, notify our office through  [place] MyChart or by phone at [phone] option 4.  

## 2021-01-18 NOTE — Progress Notes (Addendum)
Isotretinoin Follow-Up Visit   Subjective  Sharon Steele is a 20 y.o. female who presents for the following: Acne (Patient here today for isotretinoin follow up. Patient did not get Absorica 30mg  that was prescribed at last visit. She is taking spironolactone 100mg  daily with no side effects. Patient advises she does have an area at the groin that is tender and swells, sometimes drains in the shower. Patient also using clindamycin and PanOxyl. ).  Week # 4 Patient did not have prescription filled last month. Oral BC pills/condoms   Isotretinoin F/U - 01/18/21 0800       Isotretinoin Follow Up   iPledge #    Date 01/18/21    Weight 151 lb (68.5 kg)    Two Forms of Birth Control Oral Contraceptives (w/ estrogen);Female Condom    Acne breakouts since last visit? Yes      Side Effects   Skin Chapped Lips    Gastrointestinal WNL    Neurological WNL    Constitutional WNL             Side effects: Dry skin, dry lips  Denies changes in night vision, shortness of breath, abdominal pain, nausea, vomiting, diarrhea, blood in stool or urine, visual changes, headaches, epistaxis, joint pain, myalgias, mood changes, depression, or suicidal ideation.   Patient is not pregnant, not seeking pregnancy, and not breastfeeding.   The following portions of the chart were reviewed this encounter and updated as appropriate: medications, allergies, medical history  Review of Systems:  No other skin or systemic complaints except as noted in HPI or Assessment and Plan.  Objective  Well appearing patient in no apparent distress; mood and affect are within normal limits.  An examination of the face, neck, chest, and back was performed and relevant findings are noted below.   Head - Anterior (Face) 1 + open and closed comedones, many inflammatory papules at face Chest with few inflammatory papules Back with 1 + open comedones, few inflammatory papules  Pubic Multiple firm  erythematous papules, tender subcutaneous nodule at supra pubic and inguinal folds  SupraPubic Multiple firm papules and plaques   Assessment & Plan   Acne vulgaris Head - Anterior (Face)  Severe and Chronic (present >1 year); currently on Isotretinoin and not to goal (must reach target dose based on weight and also have clear skin for 2 months prior to discontinuation in order to help prevent relapse)  Urine pregnancy test performed in office today and was negative.  Patient demonstrates comprehension and confirms she will not get pregnant.   Patient confirmed in iPledge and isotretinoin sent to pharmacy.    ISOtretinoin (ABSORICA) 40 MG capsule - Head - Anterior (Face) Take 1 capsule (40 mg total) by mouth daily  Hidradenitis suppurativa Pubic  Continue spironolactone 100mg  daily Continue isotretinoin as above  BP 130/93  Spironolactone can cause increased urination and cause blood pressure to decrease. Please watch for signs of lightheadedness and be cautious when changing position. It can sometimes cause breast tenderness or an irregular period in premenopausal women. It can also increase potassium. The increase in potassium usually is not a concern unless you are taking other medicines that also increase potassium, so please be sure your doctor knows all of the other medications you are taking. This medication should not be taken by pregnant women.  This medicine should also not be taken together with sulfa drugs like Bactrim (trimethoprim/sulfamethexazole).    Related Medications spironolactone (ALDACTONE) 100 MG tablet Take  1 tablet (100 mg total) by mouth at bedtime.  Epidermal inclusion cyst SupraPubic  NDC 8756-4332-95  Intralesional injection - SupraPubic Location: suprapubic  Informed Consent: Discussed risks (infection, pain, bleeding, bruising, thinning of the skin, loss of skin pigment, lack of resolution, and recurrence of lesion) and benefits of the  procedure, as well as the alternatives. Informed consent was obtained. Preparation: The area was prepared a standard fashion.  Procedure Details: An intralesional injection was performed with Kenalog 2 mg/cc. 0.3 cc in total were injected.  Total number of injections: 4 at suprapubic, 2 at left inframammary  Plan: The patient was instructed on post-op care. Recommend OTC analgesia as needed for pain.    Xerosis secondary to isotretinoin therapy - Continue emollients as directed  Cheilitis secondary to isotretinoin therapy - Continue lip balm as directed, Dr. Clayborne Artist Cortibalm recommended  Long term medication management (isotretinoin) - While taking Isotretinoin and for 30 days after you finish the medication, do not get pregnant, do not share pills, do not donate blood. Isotretinoin is best absorbed when taken with a fatty meal. Isotretinoin can make you sensitive to the sun. Daily careful sun protection including sunscreen SPF 30+ when outdoors is recommended.  Follow-up in 30 days.  Anise Salvo, RMA, am acting as scribe for Darden Dates, MD .  Documentation: I have reviewed the above documentation for accuracy and completeness, and I agree with the above.  Darden Dates, MD

## 2021-01-20 ENCOUNTER — Other Ambulatory Visit: Payer: Self-pay

## 2021-01-20 DIAGNOSIS — Z419 Encounter for procedure for purposes other than remedying health state, unspecified: Secondary | ICD-10-CM | POA: Diagnosis not present

## 2021-01-20 NOTE — Patient Instructions (Signed)
Visit Information  Ms. Aho was given information about Medicaid Managed Care team care coordination services as a part of their [facility] Medicaid benefit. Rodena Goldmann Rasch verbally consented to engagement with the [facility] Managed Care team.   If you are experiencing a medical emergency, please call 911 or report to your local emergency department or urgent care.   If you have a non-emergency medical problem during routine business hours, please contact your provider's office and ask to speak with a nurse.   For questions related to your [facility] health plan, please call: [phone] or go here:[email or web address]  If you would like to schedule transportation through your [facility] plan, please call the following number at least 2 days in advance of your appointment: [phone].  Call the [facility] Crisis Line at [phone], at any time, 24 hours a day, 7 days a week. If you are in danger or need immediate medical attention call 911.  If you would like help to quit smoking, call 1-800-QUIT-NOW ((903)785-2405) OR Espaol: 1-855-Djelo-Ya (4-982-641-5830) o para ms informacin haga clic aqu or Text READY to 940-768 to register via text  Ms. Group - following are the goals we discussed in your visit today:   Goals Addressed   None      Social Worker will follow up with resources in 7 days.   Gus Puma, BSW, [place] [facility]  [place]  High Risk Managed Medicaid Team  [phone]   Following is a copy of your plan of care:

## 2021-01-20 NOTE — Patient Outreach (Signed)
Medicaid Managed Care Social Work Note  01/20/2021 Name:  Sharon Steele MRN:  [id number] DOB:  10/09/2000  Sharon Steele is an 20 y.o. year old female who is a primary patient of [facility].  The [facility] Managed Care Coordination team was consulted for assistance with:   housing resources  Sharon Steele was given information about Medicaid Managed Care Coordination team services today. Sharon Steele Patient agreed to services and verbal consent obtained.  Engaged with patient  for by telephone forinitial visit in response to referral for case management and/or care coordination services.   Assessments/Interventions:  Review of past medical history, allergies, medications, health status, including review of consultants reports, laboratory and other test data, was performed as part of comprehensive evaluation and provision of chronic care management services.  SDOH: (Social Determinant of Health) assessments and interventions performed:  BSW contacted patient regarding housing resources. Patient states she is staying with her aunt but is still responsible for 400 in rent, and food. Patient states she would prefer to live in [place] due to her mom being there. Patient makes about 3000/monthly gross. Patient would like a 1bedroom in the [place]. BSW will research and put a list of resources together for patient. No other resources needed at this time.   Advanced Directives Status:  Not addressed in this encounter.  Care Plan                 Allergies  Allergen Reactions   Cashew Nut Oil Hives, Itching and Swelling   Almond Oil     Medications Reviewed Today     Reviewed by Sandi Mealy, MD (Physician) on 01/18/21 at [phone]  Med List Status: <None>   Medication Order Taking? Sig Documenting Provider Last Dose Status Informant  amoxicillin-clavulanate (AUGMENTIN) 875-125 MG tablet [id number] No Take 1 tablet by mouth every 12 (twelve) hours.  Patient  not taking: Reported on 11/16/2020   [name] Not Taking Active     Discontinued 11/24/19 1034   ibuprofen (ADVIL) 200 MG tablet [id number] No Take 200 mg by mouth every 6 (six) hours as needed.  Patient not taking: Reported on 11/16/2020   [provider] Not Taking Active   Patient not taking:  Discontinued 11/24/19 1034 ISOtretinoin (ABSORICA) 40 MG capsule [id number] Yes Take 1 capsule (40 mg total) by mouth 2 (two) times daily. [name], [name], MD  Active   norgestimate-ethinyl estradiol (ORTHO-CYCLEN) 0.25-35 MG-MCG tablet [id number] No Take 1 tablet by mouth daily. [name], MD Taking Active   Pseudoephedrine-APAP-DM (DAYQUIL PO) [id number] No Take by mouth.  Patient not taking: Reported on 11/16/2020   [provider] Not Taking Active   spironolactone (ALDACTONE) 100 MG tablet [id number]  Take 1 tablet (100 mg total) by mouth at bedtime. [name], [name], MD  Active   valACYclovir (VALTREX) 1000 MG tablet [id number] No Take 1 tablet (1,000 mg total) by mouth daily. 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   Hidradenitis 03/18/2020    Conditions to be addressed/monitored per PCP order:   housing resources  Care Plan : General Plan of Care (Adult)  Updates made by Shaune Leeks since 01/20/2021 12:00 AM     Problem: Health  Promotion or Disease Self-Management (General Plan of Care)   Priority: Low  Onset Date: 11/16/2020     Long-Range Goal: Self-Management Plan Developed   Start Date: 11/16/2020  Expected End Date: 02/16/2021  Recent Progress: On track  Priority: Low  Note:     Current Barriers:  Knowledge Deficits related to short term plan for care coordination needs and long term plans for chronic disease management needs.  Patient has started new  medication for acne-also has hidradenitis.  Needs housing resources-living with Aunt right now, but would like to find her own place. Nurse Case Manager Clinical Goal(s):  patient will work with care management team to address care coordination and chronic disease management needs related to Disease Management Educational Needs Care Coordination Medication Management and Education Medication Reconciliation Medication Assistance  Psychosocial Support   Interventions:  Evaluation of current treatment plan and patient's adherence to plan as established by provider. Advised patient to contact Dermatologist , Dr. [name], about medication. Update 01/11/21:  Patient has appointment 01/18/21. Reviewed medications with patient. Discussed plans with patient for ongoing care management follow up and provided patient with direct contact information for care management team Reviewed scheduled/upcoming provider appointments. BSW referral for housing resources. BSW contacted patient regarding housing resources. Patient states she is staying with her aunt but is still responsible for 400 in rent, and food. Patient states she would prefer to live in [place] due to her mom being there. Patient makes about 3000/monthly gross. Patient would like a 1bedroom in the 850 range. BSW will research and put a list of resources together for patient. No other resources needed at this time. Self Care Activities:  Patient will self administer medications as prescribed Patient will attend all scheduled provider appointments Patient will call pharmacy for medication refills Patient will continue to perform ADL's independently Patient will call provider office for new concerns or questions Patient Goals: In the next 90 days, patient will attend all scheduled appointments. - Follow Up Plan: RNCM will follow up with patient within 30 days.  Patient has been provided with contact information.   Evidence-based guidance:    Review biopsychosocial determinants of health screens.  Review need for preventive screening based on age, sex, family history and health history.  Determine level of modifiable health risk.  Discuss identified risks.  Identify areas where behavior change may lead to improved health.  Promote healthy lifestyle.  Evoke change talk using open-ended questions, pros and cons, as well as looking forward.  Identify and manage conditions or preconditions to reduce health risk.  Implement additional goals and interventions based on identified risk factors.      Follow up:  Patient agrees to Care Plan and Follow-up.  Plan: The Managed Medicaid care management team will reach out to the patient again over the next 7 days.  Date/time of next scheduled Social Work care management/care coordination outreach:  01/31/21  Gus Puma, Kenard Gower, [facility]  [facility]  High Risk Managed Medicaid Team  [phone]

## 2021-01-31 ENCOUNTER — Other Ambulatory Visit: Payer: Self-pay

## 2021-01-31 NOTE — Patient Instructions (Signed)
Visit Information  Sharon Steele was given information about Medicaid Managed Care team care coordination services as a part of their [facility] Medicaid benefit. Sharon Steele verbally consented to engagement with the [facility] Managed Care team.   If you are experiencing a medical emergency, please call 911 or report to your local emergency department or urgent care.   If you have a non-emergency medical problem during routine business hours, please contact your provider's office and ask to speak with a nurse.   For questions related to your [facility] health plan, please call: [phone] or go here:[email or web address]  If you would like to schedule transportation through your [facility] plan, please call the following number at least 2 days in advance of your appointment: [phone].  Call the [facility] Crisis Line at [phone], at any time, 24 hours a day, 7 days a week. If you are in danger or need immediate medical attention call 911.  If you would like help to quit smoking, call 1-800-QUIT-NOW ((903) 839-1594) OR Espaol: 1-855-Djelo-Ya (2-119-417-4081) o para ms informacin haga clic aqu or Text READY to 448-185 to register via text  Sharon Steele - following are the goals we discussed in your visit today:   Goals Addressed   None      Social Worker will follow up in 6 months.   Gus Puma, BSW, [place] [facility]  [place]  High Risk Managed Medicaid Team  [phone]   Following is a copy of your plan of care:

## 2021-01-31 NOTE — Patient Outreach (Signed)
Medicaid Managed Care Social Work Note  01/31/2021 Name:  Sharon Steele MRN:  [id number] DOB:  10-29-2000  Sharon Steele is an 20 y.o. year old female who is a primary patient of [facility].  The [facility] Managed Care Coordination team was consulted for assistance with:   housing  [name] was given information about Medicaid Managed Care Coordination team services today. Sharon Steele Patient agreed to services and verbal consent obtained.  Engaged with patient  for by telephone forfollow up visit in response to referral for case management and/or care coordination services.   Assessments/Interventions:  Review of past medical history, allergies, medications, health status, including review of consultants reports, laboratory and other test data, was performed as part of comprehensive evaluation and provision of chronic care management services.  SDOH: (Social Determinant of Health) assessments and interventions performed:  BSW completed research and located resources for patient. BSW contacted patient to provide resources. Patient stated she found and completed an application today for [employer] in [place]. Patient stated no other resources or services are needed at this time.   Advanced Directives Status:  Not addressed in this encounter.  Care Plan                 Allergies  Allergen Reactions   Cashew Nut Oil Hives, Itching and Swelling   Almond Oil     Medications Reviewed Today     Reviewed by Sandi Mealy, MD (Physician) on 01/18/21 at [phone]  Med List Status: <None>   Medication Order Taking? Sig Documenting Provider Last Dose Status Informant  amoxicillin-clavulanate (AUGMENTIN) 875-125 MG tablet [id number] No Take 1 tablet by mouth every 12 (twelve) hours.  Patient not taking: Reported on 11/16/2020   [name] Not Taking Active     Discontinued 11/24/19 1034   ibuprofen (ADVIL) 200 MG tablet [id number] No Take 200  mg by mouth every 6 (six) hours as needed.  Patient not taking: Reported on 11/16/2020   [provider] Not Taking Active   Patient not taking:  Discontinued 11/24/19 1034 ISOtretinoin (ABSORICA) 40 MG capsule [id number] Yes Take 1 capsule (40 mg total) by mouth 2 (two) times daily. [name], [name], MD  Active   norgestimate-ethinyl estradiol (ORTHO-CYCLEN) 0.25-35 MG-MCG tablet [id number] No Take 1 tablet by mouth daily. [name], MD Taking Active   Pseudoephedrine-APAP-DM (DAYQUIL PO) [id number] No Take by mouth.  Patient not taking: Reported on 11/16/2020   [provider] Not Taking Active   spironolactone (ALDACTONE) 100 MG tablet [id number]  Take 1 tablet (100 mg total) by mouth at bedtime. [name], [name], MD  Active   valACYclovir (VALTREX) 1000 MG tablet [id number] No Take 1 tablet (1,000 mg total) by mouth daily. 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   Hidradenitis 03/18/2020    Conditions to be addressed/monitored per PCP order:   housing  Care Plan : General Plan of Care (Adult)  Updates made by Shaune Leeks since 01/31/2021 12:00 AM     Problem: Health Promotion or Disease Self-Management (General Plan of Care)   Priority: Low  Onset Date: 11/16/2020     Long-Range Goal: Self-Management Plan Developed   Start Date: 11/16/2020  Expected End  Date: 02/16/2021  Recent Progress: On track  Priority: Low  Note:     Current Barriers:  Knowledge Deficits related to short term plan for care coordination needs and long term plans for chronic disease management needs.  Patient has started new medication for acne-also has hidradenitis.  Needs housing resources-living with Aunt right now, but would like to find her own place. Nurse Case Manager Clinical Goal(s):  patient  will work with care management team to address care coordination and chronic disease management needs related to Disease Management Educational Needs Care Coordination Medication Management and Education Medication Reconciliation Medication Assistance  Psychosocial Support   Interventions:  Evaluation of current treatment plan and patient's adherence to plan as established by provider. Advised patient to contact Dermatologist , Dr. [name], about medication. Update 01/11/21:  Patient has appointment 01/18/21. Reviewed medications with patient. Discussed plans with patient for ongoing care management follow up and provided patient with direct contact information for care management team Reviewed scheduled/upcoming provider appointments. BSW referral for housing resources. BSW contacted patient regarding housing resources. Patient states she is staying with her aunt but is still responsible for 400 in rent, and food. Patient states she would prefer to live in [place] due to her mom being there. Patient makes about 3000/monthly gross. Patient would like a 1bedroom in the 850 range. BSW will research and put a list of resources together for patient. No other resources needed at this time. 01/31/21: BSW completed research and located resources for patient. BSW contacted patient to provide resources. Patient stated she found and completed an application today for [employer] in [place]. Patient stated no other resources or services are needed at this time. Self Care Activities:  Patient will self administer medications as prescribed Patient will attend all scheduled provider appointments Patient will call pharmacy for medication refills Patient will continue to perform ADL's independently Patient will call provider office for new concerns or questions Patient Goals: In the next 90 days, patient will attend all scheduled appointments. - Follow Up Plan: RNCM will follow up with patient within  30 days.  Patient has been provided with contact information.   Evidence-based guidance:   Review biopsychosocial determinants of health screens.  Review need for preventive screening based on age, sex, family history and health history.  Determine level of modifiable health risk.  Discuss identified risks.  Identify areas where behavior change may lead to improved health.  Promote healthy lifestyle.  Evoke change talk using open-ended questions, pros and cons, as well as looking forward.  Identify and manage conditions or preconditions to reduce health risk.  Implement additional goals and interventions based on identified risk factors.      Follow up:  Patient agrees to Care Plan and Follow-up.  Plan: The Managed Medicaid care management team will reach out to the patient again over the next 120 days.  Date/time of next scheduled Social Work care management/care coordination outreach:  08/01/20  Gus Puma, Kenard Gower, [facility]  [facility]  High Risk Managed Medicaid Team  [phone]

## 2021-02-01 ENCOUNTER — Ambulatory Visit: Payer: Medicaid Other

## 2021-02-01 ENCOUNTER — Other Ambulatory Visit: Payer: Self-pay

## 2021-02-01 DIAGNOSIS — L700 Acne vulgaris: Secondary | ICD-10-CM

## 2021-02-01 MED ORDER — ISOTRETINOIN 40 MG PO CAPS: 40.0000 mg | ORAL_CAPSULE | Freq: Two times a day (BID) | ORAL | 0 refills | Status: DC

## 2021-02-01 NOTE — Progress Notes (Signed)
Patient missed window for accutane. She states she answered her questions but never heard from pharmacy. Patient instructed that once she answers her questions she is to reach out to the pharmacy if she has not heard from them within a day or two. Patient advised the 7 day window is very strict.   Urine pregnancy test negative. Patient re confirmed in ipledge and RX sent in. Appointment was moved 1 month from today.

## 2021-02-09 ENCOUNTER — Other Ambulatory Visit: Payer: Self-pay | Admitting: Obstetrics and Gynecology

## 2021-02-09 NOTE — Patient Instructions (Signed)
Hi Ms. Ciszewski, sorry I missed you today, I hope you are doing okay - as a part of your Medicaid benefit, you are eligible for care management and care coordination services at no cost or copay. I was unable to reach you by phone today but would be happy to help you with your health related needs. Please feel free to call me at [phone].   A member of the Managed Medicaid care management team will reach out to you again over the next 7-14 days.   Kathi Der RN, BSN [place]  [facility] - Managed Medicaid High Risk [phone].

## 2021-02-09 NOTE — Patient Outreach (Signed)
Care Coordination  02/09/2021  ALSHA MELAND [birth year] [id number]   Medicaid Managed Care   Unsuccessful Outreach Note  02/09/2021 Name: CATARINA HUNTLEY MRN: [id number] DOB: 2000/05/29  Referred by: [facility] Reason for referral : High Risk Managed Medicaid (Unsuccessful telephone outreach)   An unsuccessful telephone outreach was attempted today. The patient was referred to the case management team for assistance with care management and care coordination.   Follow Up Plan: The care management team will reach out to the patient again over the next 7-14 days.   Kathi Der RN, BSN [place]  [facility] [employer] - Managed Medicaid High Risk [phone].

## 2021-02-19 DIAGNOSIS — Z419 Encounter for procedure for purposes other than remedying health state, unspecified: Secondary | ICD-10-CM | POA: Diagnosis not present

## 2021-02-23 ENCOUNTER — Ambulatory Visit: Payer: Medicaid Other | Admitting: Dermatology

## 2021-03-03 ENCOUNTER — Other Ambulatory Visit: Payer: Self-pay

## 2021-03-03 ENCOUNTER — Other Ambulatory Visit: Payer: Self-pay | Admitting: Obstetrics and Gynecology

## 2021-03-03 NOTE — Patient Instructions (Signed)
Hi Ms. Cancelliere, thank you for speaking with me today, have a great afternoon!!  Ms. Berkery was given information about Medicaid Managed Care team care coordination services as a part of their [facility] Medicaid benefit. Rodena Goldmann Bensch verbally consented to engagement with the [facility] Managed Care team.   If you are experiencing a medical emergency, please call 911 or report to your local emergency department or urgent care.   If you have a non-emergency medical problem during routine business hours, please contact your provider's office and ask to speak with a nurse.   For questions related to your [facility] health plan, please call: [phone] or go here:[email or web address]  If you would like to schedule transportation through your [facility] plan, please call the following number at least 2 days in advance of your appointment: [phone].  Call the [facility] Crisis Line at [phone], at any time, 24 hours a day, 7 days a week. If you are in danger or need immediate medical attention call 911.  If you would like help to quit smoking, call 1-800-QUIT-NOW ((223) 753-1623) OR Espaol: 1-855-Djelo-Ya (2-376-283-1517) o para ms informacin haga clic aqu or Text READY to 616-073 to register via text  Ms. Donaway - following are the goals we discussed in your visit today:   Goals Addressed             This Visit's Progress    Protect My Health       Timeframe:  Long-Range Goal Priority:  Low Start Date:    11/16/20                         Expected End Date:    ongoing                   Follow Up Date: 04/03/21   - practice safe sex - schedule appointment for flu shot - schedule appointment for vaccines needed due to my age or health - schedule recommended health tests (blood work, mammogram, colonoscopy, pap test) - schedule and keep appointment for annual check-up    Why is this important?   Screening tests can find diseases early when they  are easier to treat.  Your doctor or nurse will talk with you about which tests are important for you.  Getting shots for common diseases like the flu and shingles will help prevent them.     Update 01/11/21:  Patient has appointment 01/18/21 with Dr. [name] for follow up-refill medications. Update 03/03/21:  Patient needs to see eye and dental provider-will make referral    The patient verbalized understanding of instructions provided today and declined a print copy of patient instruction materials.   The Managed Medicaid care management team will reach out to the patient again over the next 30 days.  The  Patient  has been provided with contact information for the Managed Medicaid care management team and has been advised to call with any health related questions or concerns.   Kathi Der RN, BSN [place]  [facility] [employer] - Managed Medicaid High Risk [phone].   Following is a copy of your plan of care:  Patient Care Plan: General Plan of Care (Adult)     Problem Identified: Health Promotion or Disease Self-Management (General Plan of Care)   Priority: Low  Onset Date: 11/16/2020     Long-Range Goal: Self-Management Plan Developed   Start Date: 11/16/2020  Expected End Date: 06/03/2021  Recent Progress:  On track  Priority: Low  Note:    Current Barriers:  Knowledge Deficits related to short term plan for care coordination needs and long term plans for chronic disease management needs.  Patient has started new medication for acne-also has hidradenitis.  Needs eye and dental provider resources. Nurse Case Manager Clinical Goal(s):  patient will work with care management team to address care coordination and chronic disease management needs related to Disease Management Educational Needs Care Coordination Medication Management and Education Medication Reconciliation Medication Assistance  Psychosocial Support   Interventions:   Evaluation of current treatment plan and patient's adherence to plan as established by provider. Advised patient to contact Dermatologist , Dr. [name], about medication. Update 01/11/21:  Patient has appointment 01/18/21-continues to follow. Reviewed medications with patient. Discussed plans with patient for ongoing care management follow up and provided patient with direct contact information for care management team Reviewed scheduled/upcoming provider appointments. Collaborated with SW for dental and eye resources BSW referral for dental and eye providers. BSW referral for housing resources-completed. BSW contacted patient regarding housing resources. Patient states she is staying with her aunt but is still responsible for 400 in rent, and food. Patient states she would prefer to live in [place] due to her mom being there. Patient makes about 3000/monthly gross. Patient would like a 1bedroom in the 850 range. BSW will research and put a list of resources together for patient. No other resources needed at this time. 01/31/21: BSW completed research and located resources for patient. BSW contacted patient to provide resources. Patient stated she found and completed an application today for [employer] in [place]. Patient stated no other resources or services are needed at this time. Update 03/03/21: Patient states she moved to [employer] 02/09/21-has roommates. Self Care Activities:  Patient will self administer medications as prescribed Patient will attend all scheduled provider appointments Patient will call pharmacy for medication refills Patient will continue to perform ADL's independently Patient will call provider office for new concerns or questions Patient Goals: In the next 90 days, patient will attend all scheduled appointments. - Follow Up Plan: RNCM will follow up with patient within 30 days.  Patient has been provided with contact information.   Evidence-based  guidance:   Review biopsychosocial determinants of health screens.  Review need for preventive screening based on age, sex, family history and health history.  Determine level of modifiable health risk.  Discuss identified risks.  Identify areas where behavior change may lead to improved health.  Promote healthy lifestyle.  Evoke change talk using open-ended questions, pros and cons, as well as looking forward.  Identify and manage conditions or preconditions to reduce health risk.  Implement additional goals and interventions based on identified risk factors.

## 2021-03-03 NOTE — Patient Outreach (Signed)
Medicaid Managed Care   Nurse Care Manager Note  03/03/2021 Name:  Sharon Steele MRN:  [id number] DOB:  2001/05/11  Sharon Steele is an 20 y.o. year old female who is a primary patient of [facility].  The Sharon Steele Managed Care Coordination team was consulted for assistance with:    Healthcare management needs.  [name] was given information about Medicaid Managed Care Coordination team services today. Sharon Steele Patient agreed to services and verbal consent obtained.  Engaged with patient by telephone for follow up visit in response to provider referral for case management and/or care coordination services.   Assessments/Interventions:  Review of past medical history, allergies, medications, Steele status, including review of consultants reports, laboratory and other test data, was performed as part of comprehensive evaluation and provision of chronic care management services.  SDOH (Social Determinants of Steele) assessments and interventions performed: SDOH Interventions    Flowsheet Row Most Recent Value  SDOH Interventions   Food Insecurity Interventions Intervention Not Indicated  Housing Interventions Intervention Not Indicated  Stress Interventions Intervention Not Indicated  Social Connections Interventions Intervention Not Indicated       Care Plan  Allergies  Allergen Reactions   Cashew Nut Oil Hives, Itching and Swelling   Almond Oil     Medications Reviewed Today     Reviewed by [name], RN (Registered Nurse) on 03/03/21 at 1433  Med List Status: <None>   Medication Order Taking? Sig Documenting Provider Last Dose Status Informant  amoxicillin-clavulanate (AUGMENTIN) 875-125 MG tablet [id number]  Take 1 tablet by mouth every 12 (twelve) hours.  Patient not taking: Reported on 11/16/2020   [name]  Active     Discontinued 11/24/19 1034   ibuprofen (ADVIL) 200 MG tablet [id number]  Take 200 mg by mouth  every 6 (six) hours as needed.  Patient not taking: Reported on 11/16/2020   [provider]  Active    Patient not taking:   Discontinued 11/24/19 1034 ISOtretinoin (ABSORICA) 40 MG capsule [id number] Yes Take 1 capsule (40 mg total) by mouth 2 (two) times daily. [name], [name], MD Taking Active   norgestimate-ethinyl estradiol (ORTHO-CYCLEN) 0.25-35 MG-MCG tablet [id number] Yes Take 1 tablet by mouth daily. [name], MD Taking Active   Pseudoephedrine-APAP-DM (DAYQUIL PO) [id number]  Take by mouth.  Patient not taking: Reported on 11/16/2020   [provider]  Active   spironolactone (ALDACTONE) 100 MG tablet [id number] Yes Take 1 tablet (100 mg total) by mouth at bedtime. [name], [name], MD Taking Active   valACYclovir (VALTREX) 1000 MG tablet [id number] Yes Take 1 tablet (1,000 mg total) by mouth daily. 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  Care Plan : General Plan of Care (Adult)  Updates made by [name], RN since 03/03/2021 12:00 AM     Problem: Steele Promotion or Disease Self-Management (General Plan of Care)   Priority: Low  Onset Date: 11/16/2020     Long-Range Goal: Self-Management Plan Developed   Start Date: 11/16/2020  Expected End Date: 06/03/2021  Recent Progress: On track  Priority:  Low  Note:    Current Barriers:  Knowledge Deficits related to short term plan for care coordination needs and long term plans for chronic disease management needs.  Patient has started new medication for acne-also has hidradenitis.  Needs eye and dental provider resources. Nurse Case Manager Clinical Goal(s):  patient will work  with care management team to address care coordination and chronic disease management needs related to Disease Management Educational Needs Care Coordination Medication Management and Education Medication Reconciliation Medication Assistance  Psychosocial Support   Interventions:  Evaluation of current treatment plan and patient's adherence to plan as established by provider. Advised patient to contact Dermatologist , Dr. [name], about medication. Update 01/11/21:  Patient has appointment 01/18/21-continues to follow. Reviewed medications with patient. Discussed plans with patient for ongoing care management follow up and provided patient with direct contact information for care management team Reviewed scheduled/upcoming provider appointments. Collaborated with SW for dental and eye resources BSW referral for dental and eye providers. BSW referral for housing resources-completed. BSW contacted patient regarding housing resources. Patient states she is staying with her aunt but is still responsible for 400 in rent, and food. Patient states she would prefer to live in [place] due to her mom being there. Patient makes about 3000/monthly gross. Patient would like a 1bedroom [place]. BSW will research and put a list of resources together for patient. No other resources needed at this time. 01/31/21: BSW completed research and located resources for patient. BSW contacted patient to provide resources. Patient stated she found and completed an application today for [employer] in [place]. Patient stated no other resources or services are needed at this time. Update 03/03/21: Patient states she moved to [place] [place] 02/09/21-has roommates. Self Care Activities:  Patient will self administer medications as prescribed Patient will attend all scheduled provider appointments Patient will call pharmacy for medication refills Patient will continue to perform ADL's independently Patient  will call provider office for new concerns or questions Patient Goals: In the next 90 days, patient will attend all scheduled appointments. - Follow Up Plan: RNCM will follow up with patient within 30 days.  Patient has been provided with contact information.   Evidence-based guidance:   Review biopsychosocial determinants of Steele screens.  Review need for preventive screening based on age, sex, family history and Steele history.  Determine level of modifiable Steele risk.  Discuss identified risks.  Identify areas where behavior change may lead to improved Steele.  Promote healthy lifestyle.  Evoke change talk using open-ended questions, pros and cons, as well as looking forward.  Identify and manage conditions or preconditions to reduce Steele risk.  Implement additional goals and interventions based on identified risk factors.    Follow Up:  Patient agrees to Care Plan and Follow-up.  Plan: The Managed Medicaid care management team will reach out to the patient again over the next 30 days. and The  Patient has been provided with contact information for the Managed Medicaid care management team and has been advised to call with any Steele related questions or concerns.  Date/time of next scheduled RN care management/care coordination outreach:  03/30/21 at 1230.

## 2021-03-08 ENCOUNTER — Encounter: Payer: Self-pay | Admitting: Dermatology

## 2021-03-08 ENCOUNTER — Ambulatory Visit: Payer: Medicaid Other | Admitting: Dermatology

## 2021-03-08 ENCOUNTER — Other Ambulatory Visit: Payer: Self-pay

## 2021-03-08 VITALS — Wt 151.0 lb

## 2021-03-08 DIAGNOSIS — Z79899 Other long term (current) drug therapy: Secondary | ICD-10-CM | POA: Diagnosis not present

## 2021-03-08 DIAGNOSIS — L732 Hidradenitis suppurativa: Secondary | ICD-10-CM

## 2021-03-08 DIAGNOSIS — L720 Epidermal cyst: Secondary | ICD-10-CM

## 2021-03-08 DIAGNOSIS — L700 Acne vulgaris: Secondary | ICD-10-CM

## 2021-03-08 DIAGNOSIS — K130 Diseases of lips: Secondary | ICD-10-CM | POA: Diagnosis not present

## 2021-03-08 DIAGNOSIS — L853 Xerosis cutis: Secondary | ICD-10-CM | POA: Diagnosis not present

## 2021-03-08 MED ORDER — ABSORICA LD 24 MG PO CAPS: 2.0000 | ORAL_CAPSULE | Freq: Every day | ORAL | 0 refills | Status: DC

## 2021-03-08 MED ORDER — CLINDAMYCIN PHOSPHATE 1 % EX LOTN: TOPICAL_LOTION | Freq: Two times a day (BID) | CUTANEOUS | 2 refills | Status: DC

## 2021-03-08 NOTE — Progress Notes (Signed)
Isotretinoin Follow-Up Visit   Subjective  Sharon Steele is a 20 y.o. female who presents for the following: Acne (Patient here today for 30 day isotretinoin follow up. Patient advises she does have a sore area of HS under left breast. Patient using PanOxyl and is taking spironolactone 100 mg once daily. She stopped taking it for 1 week and feels that her HS improved. ).  Week # 8 Oral [facility] pills/condoms [facility] pharmacy iPledge #[id number]   Isotretinoin F/U - 03/08/21 0800       Isotretinoin Follow Up   iPledge # [id number]    Date 03/08/21    Weight 151 lb (68.5 kg)    Two Forms of Birth Control Oral Contraceptives (w/ estrogen);Female Condom    Acne breakouts since last visit? Yes      Side Effects   Skin Chapped Lips;Dry Skin    Gastrointestinal WNL    Neurological WNL    Constitutional Muscle/joint aches   patient having more back pain than usual            Side effects: Dry skin, dry lips  Denies changes in night vision, shortness of breath, abdominal pain, nausea, vomiting, diarrhea, blood in stool or urine, visual changes, headaches, epistaxis, joint pain, myalgias, mood changes, depression, or suicidal ideation.   Patient is not pregnant, not seeking pregnancy, and not breastfeeding.   The following portions of the chart were reviewed this encounter and updated as appropriate: medications, allergies, medical history  Review of Systems:  No other skin or systemic complaints except as noted in HPI or Assessment and Plan.  Objective  Well appearing patient in no apparent distress; mood and affect are within normal limits.  An examination of the face, neck, chest, and back was performed and relevant findings are noted below.   face Face with trace open comedones, scattered inflammatory papules, few cysts at chin Chest with few resolving inflammatory papules, trace open comedones Back with many resolving inflammatory papules, trace open comedones  Left  Inframammary Fold Cysts left inframammary, scattered scars   Assessment & Plan   Acne vulgaris face  Continue Absorica increasing to LD 24 mg 2 by mouth once daily  Severe and Chronic (present >1 year); currently on Isotretinoin and not to goal (must reach target dose based on weight and also have clear skin for 2 months prior to discontinuation in order to help prevent relapse)  Urine pregnancy test performed in office today and was negative.  Patient demonstrates comprehension and confirms she will not get pregnant.   Patient confirmed in iPledge and isotretinoin sent to pharmacy.    ISOtretinoin Micronized (ABSORICA LD) 24 MG CAPS - face Take 2 capsules by mouth daily.  Hidradenitis suppurativa Left Inframammary Fold  Continue clindamycin topical increasing to 1-2 times daily to areas that tend to get active HS, will send in lotion to see if we can get approved as this is much easier to spread on large areas  Continue PanOxyl once daily  Discontinue spironolactone due to possibility it may contribute to her HS. Discussed this usually helps with HS but will try stopping to see how she does  Continue isotretinoin as above  NDC 6010-9323-55  Intralesional injection - Left Inframammary Fold Location: left inframammary  Informed Consent: Discussed risks (infection, pain, bleeding, bruising, thinning of the skin, loss of skin pigment, lack of resolution, and recurrence of lesion) and benefits of the procedure, as well as the alternatives. Informed consent was obtained. Preparation: The area  was prepared a standard fashion.  Procedure Details: An intralesional injection was performed with Kenalog 2 mg/cc. 0.2 cc in total were injected.  Total number of injections: 2  Plan: The patient was instructed on post-op care. Recommend OTC analgesia as needed for pain.   clindamycin (CLEOCIN-T) 1 % lotion - Left Inframammary Fold Apply topically 2 (two) times daily.  Related  Procedures Anaerobic and Aerobic Culture   Xerosis secondary to isotretinoin therapy - Continue emollients as directed  Cheilitis secondary to isotretinoin therapy - Continue lip balm as directed, Dr. Clayborne Artist Cortibalm recommended  Long term medication management (isotretinoin) - While taking Isotretinoin and for 30 days after you finish the medication, do not get pregnant, do not share pills, do not donate blood. Isotretinoin is best absorbed when taken with a fatty meal. Isotretinoin can make you sensitive to the sun. Daily careful sun protection including sunscreen SPF 30+ when outdoors is recommended.  Follow-up in 30 days.  Sharon Steele, RMA, am acting as scribe for Darden Dates, MD .  Documentation: I have reviewed the above documentation for accuracy and completeness, and I agree with the above.  Darden Dates, MD

## 2021-03-08 NOTE — Patient Instructions (Addendum)
Continue clindamycin solution 1-2 times daily, will send in lotion to see if we can get approved Continue PanOxyl once daily  Absorica LD 24 mg 2 by mouth once daily  While taking Isotretinoin and for 30 days after you finish the medication, do not get pregnant, do not share pills, do not donate blood.  Generic isotretinoin is best absorbed when taken with a fatty meal. Isotretinoin can make you sensitive to the sun. Daily careful sun protection including sunscreen SPF 30+ when outdoors is recommended.  If you have any questions or concerns for your doctor, please call our main line at [phone] and press option 4 to reach your doctor's medical assistant. If no one answers, please leave a voicemail as directed and we will return your call as soon as possible. Messages left after 4 pm will be answered the following business day.   You may also send [place] a message via MyChart. We typically respond to MyChart messages within 1-2 business days.  For prescription refills, please ask your pharmacy to contact our office. Our fax number is [phone].  If you have an urgent issue when the clinic is closed that cannot wait until the next business day, you can page your doctor at the number below.    Please note that while we do our best to be available for urgent issues outside of office hours, we are not available 24/7.   If you have an urgent issue and are unable to reach [place], you may choose to seek medical care at your doctor's office, retail clinic, urgent care center, or emergency room.  If you have a medical emergency, please immediately call 911 or go to the emergency department.  Pager Numbers  - Dr. Gwen Pounds: [phone]  - Dr. [name]: [phone]  - Dr. Roseanne Reno: [phone]  In the event of inclement weather, please call our main line at [phone] for an update on the status of any delays or closures.  Dermatology Medication Tips: Please keep the boxes that topical medications  come in in order to help keep track of the instructions about where and how to use these. Pharmacies typically print the medication instructions only on the boxes and not directly on the medication tubes.   If your medication is too expensive, please contact our office at [phone] option 4 or send [place] a message through MyChart.   We are unable to tell what your co-pay for medications will be in advance as this is different depending on your insurance coverage. However, we may be able to find a substitute medication at lower cost or fill out paperwork to get insurance to cover a needed medication.   If a prior authorization is required to get your medication covered by your insurance company, please allow [place] 1-2 business days to complete this process.  Drug prices often vary depending on where the prescription is filled and some pharmacies may offer cheaper prices.  The website www.goodrx.com contains coupons for medications through different pharmacies. The prices here do not account for what the cost may be with help from insurance (it may be cheaper with your insurance), but the website can give you the price if you did not use any insurance.  - You can print the associated coupon and take it with your prescription to the pharmacy.  - You may also stop by our office during regular business hours and pick up a GoodRx coupon card.  - If you need your prescription sent electronically to a different pharmacy, notify  our office through [facility] or by phone at [phone] option 4.

## 2021-03-11 ENCOUNTER — Other Ambulatory Visit: Payer: Self-pay

## 2021-03-11 NOTE — Patient Instructions (Signed)
Visit Information  Sharon Steele  - as a part of your Medicaid benefit, you are eligible for care management and care coordination services at no cost or copay. I was unable to reach you by phone today but would be happy to help you with your health related needs. Please feel free to call me @ [phone].   A member of the Managed Medicaid care management team will reach out to you again over the next 14 days.   Gus Puma, BSW, [facility]  [employer] Risk Managed Medicaid Team  [phone]

## 2021-03-11 NOTE — Patient Outreach (Signed)
Care Coordination  03/11/2021  WRENNA SAKS 06-19-2000 [id number]   Medicaid Managed Care   Unsuccessful Outreach Note  03/11/2021 Name: FAWNA CRANMER MRN: [id number] DOB: Mar 24, 2001  Referred by: [facility] Reason for referral : High Risk Managed Medicaid (MM social work Unsuccessful [employer])   An unsuccessful telephone outreach was attempted today. The patient was referred to the case management team for assistance with care management and care coordination.   Follow Up Plan: The care management team will reach out to the patient again over the next 14 days.   Gus Puma, BSW, [place] [facility]  [employer] Risk Managed Medicaid Team  [phone]

## 2021-03-17 ENCOUNTER — Telehealth: Payer: Self-pay

## 2021-03-17 ENCOUNTER — Other Ambulatory Visit: Payer: Self-pay

## 2021-03-17 LAB — ANAEROBIC AND AEROBIC CULTURE

## 2021-03-17 MED ORDER — CLINDAMYCIN PHOSPHATE 1 % EX SOLN: Freq: Two times a day (BID) | CUTANEOUS | 0 refills | Status: AC

## 2021-03-17 NOTE — Telephone Encounter (Signed)
-----   Message from Sandi Mealy, MD sent at 03/17/2021  5:09 PM EDT ----- No bacterial growth. Continue isotretinoin and clindamycion solution as directed.  MAs please call. Thank you!

## 2021-03-17 NOTE — Progress Notes (Signed)
Clindamycin Solution sent in as replacement to Clindamycin Cream. Cream not covered and will cost patient $50.

## 2021-03-17 NOTE — Telephone Encounter (Signed)
Patient advised of results.

## 2021-03-22 ENCOUNTER — Telehealth: Payer: Self-pay

## 2021-03-22 DIAGNOSIS — L732 Hidradenitis suppurativa: Secondary | ICD-10-CM

## 2021-03-22 DIAGNOSIS — Z419 Encounter for procedure for purposes other than remedying health state, unspecified: Secondary | ICD-10-CM | POA: Diagnosis not present

## 2021-03-22 NOTE — Telephone Encounter (Signed)
Patient called and left message that she is having a significant HS flare that is very painful. Also I did a PA on the Clindamycin Lotion and it was denied.

## 2021-03-23 MED ORDER — SULFAMETHOXAZOLE-TRIMETHOPRIM 800-160 MG PO TABS: 1.0000 | ORAL_TABLET | Freq: Two times a day (BID) | ORAL | 0 refills | Status: AC

## 2021-03-23 NOTE — Telephone Encounter (Addendum)
  Discussed culture results with patient and instructed patient to continue Isotretinoin and clindamycin solution as directed. Patient reports she is still waiting to receive Clindamycin solution but should get today.   Patient also informed of providers response to her phone message left from yesterday. Patient was instructed of medications to start. Sent new prescription to [facility] [address]  in [place].   Patient verbalized understanding and denied further questions at this time.   ----- Message from Sandi Mealy, MD sent at 03/17/2021  5:09 PM EDT ----- No bacterial growth. Continue isotretinoin and clindamycion solution as directed.  MAs please call. Thank you!

## 2021-03-23 NOTE — Telephone Encounter (Signed)
-----   Message from Sandi Mealy, MD sent at 03/17/2021  5:09 PM EDT ----- No bacterial growth. Continue isotretinoin and clindamycion solution as directed.  MAs please call. Thank you!

## 2021-03-23 NOTE — Telephone Encounter (Signed)
Tried to call patient regarding results and call was rejected/hd

## 2021-03-23 NOTE — Telephone Encounter (Signed)
Please send clindamycin solution to use twice a day Recommend restarting spironolactone daily Recommend starting zinc 30 mg (supplement) 3 times a day If she wants, I can prescribe 7 days of bactrim DS 1 tablet twice a day to help calm her flare. This would work the fastest of the things I am recommending. There is an occasional risk of allergic reaction to this antibiotic though she has no known allergy to this. Thank you!

## 2021-03-30 ENCOUNTER — Other Ambulatory Visit: Payer: Self-pay

## 2021-03-30 ENCOUNTER — Other Ambulatory Visit: Payer: Self-pay | Admitting: Obstetrics and Gynecology

## 2021-03-30 NOTE — Patient Outreach (Signed)
Medicaid Managed Care   Nurse Care Manager Note  03/30/2021 Name:  Sharon Steele MRN:  [id number] DOB:  May 13, 2001  Sharon Steele is an 20 y.o. year old female who is a primary patient of [facility].  The [facility] Managed Care Coordination team was consulted for assistance with:    Healthcare management needs.  Ms. Boucher was given information about Medicaid Managed Care Coordination team services today. Sharon Steele Patient agreed to services and verbal consent obtained.  Engaged with patient by telephone for follow up visit in response to provider referral for case management and/or care coordination services.   Assessments/Interventions:  Review of past medical history, allergies, medications, health status, including review of consultants reports, laboratory and other test data, was performed as part of comprehensive evaluation and provision of chronic care management services.  SDOH (Social Determinants of Health) assessments and interventions performed: SDOH Interventions    Flowsheet Row Most Recent Value  SDOH Interventions   Financial Strain Interventions Intervention Not Indicated  Physical Activity Interventions Intervention Not Indicated      Care Plan  Allergies  Allergen Reactions   Cashew Nut Oil Hives, Itching and Swelling   Almond Oil     Medications Reviewed Today     Reviewed by [name], RN (Registered Nurse) on 03/30/21 at 1246  Med List Status: <None>   Medication Order Taking? Sig Documenting Provider Last Dose Status Informant  amoxicillin-clavulanate (AUGMENTIN) 875-125 MG tablet [id number]  Take 1 tablet by mouth every 12 (twelve) hours.  Patient not taking: Reported on 11/16/2020   [name]  Active     Discontinued 11/24/19 1034   clindamycin (CLEOCIN T) 1 % external solution [id number] Yes Apply topically 2 (two) times daily. [name], [name], MD Taking Active   ibuprofen (ADVIL) 200 MG tablet  [id number]  Take 200 mg by mouth every 6 (six) hours as needed.  Patient not taking: Reported on 11/16/2020   [provider]  Active    Patient not taking:   Discontinued 11/24/19 1034 ISOtretinoin Micronized (ABSORICA LD) 24 MG CAPS [id number] Yes Take 2 capsules by mouth daily. [name], [name], MD Taking Active   norgestimate-ethinyl estradiol (ORTHO-CYCLEN) 0.25-35 MG-MCG tablet [id number] Yes Take 1 tablet by mouth daily. [name], MD Taking Active   Pseudoephedrine-APAP-DM (DAYQUIL PO) [id number]  Take by mouth.  Patient not taking: Reported on 11/16/2020   [provider]  Active   sulfamethoxazole-trimethoprim (BACTRIM DS) 800-160 MG tablet [id number] Yes Take 1 tablet by mouth 2 (two) times daily for 7 days. For HS flare [name], [name], MD Taking Active   valACYclovir (VALTREX) 1000 MG tablet [id number] Yes Take 1 tablet (1,000 mg total) by mouth daily. 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   Hidradenitis 03/18/2020    Conditions to be addressed/monitored per PCP order:   healthcare management needs, hidradenitis, resources  Care Plan : General Plan of Care (Adult)  Updates made by [name], RN since 03/30/2021 12:00 AM     Problem: Health Promotion or Disease Self-Management (General Plan of Care)   Priority: Low  Onset Date: 11/16/2020     Long-Range Goal: Self-Management Plan Developed   Start Date: 11/16/2020  Expected End  Date: 06/03/2021  Recent Progress: On track  Priority: Low  Note:    Current Barriers:  Knowledge Deficits related to short term plan for care coordination needs and long term plans for chronic disease management needs.  Patient has started new medication for acne-also has hidradenitis.  Needs eye and dental provider resources.  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 Advised patient to contact Dermatologist , Dr. [name], about medication. Update 01/11/21:  Patient has appointment 01/18/21-continues to follow. Reviewed medications with patient. Discussed plans with patient for ongoing care management follow up and provided patient with direct contact information for care management team Reviewed scheduled/upcoming provider appointments. Collaborated with SW for dental and eye resources-has appointment 03/31/21 BSW referral for dental and eye providers. BSW referral for housing resources-completed. BSW contacted patient regarding housing resources. Patient states she is staying with her aunt but is still responsible for 400 in rent, and food. Patient states she would prefer to live in [place] due to her mom being there. Patient makes about 3000/monthly gross. Patient would like a 1bedroom [place]. BSW will research and put a list of resources together for patient. No other resources needed at this time. 01/31/21: BSW completed research and located resources for patient. BSW contacted patient to provide resources. Patient stated she found and completed an application today for [employer] in [place]. Patient stated no other resources or services are needed at this time. Update 03/03/21: Patient states she moved to [place] 02/09/21-has roommates. Self Care Activities:  Patient will self administer medications as prescribed Patient will attend all scheduled provider appointments Patient  will call pharmacy for medication refills Patient will continue to perform ADL's independently Patient will call provider office for new concerns or questions Patient Goals: In the next 90 days, patient will attend all scheduled appointments. - Follow Up Plan: RNCM will follow up with patient within 30 days.  Patient has been provided with contact information.   Evidence-based guidance:   Review biopsychosocial determinants of health screens.  Review need for preventive screening based on age, sex, family history and health history.  Determine level of modifiable health risk.  Discuss identified risks.  Identify areas where behavior change may lead to improved health.  Promote healthy lifestyle.  Evoke change talk using open-ended questions, pros and cons, as well as looking forward.  Identify and manage conditions or preconditions to reduce health risk.  Implement additional goals and interventions based on identified risk factors.    Follow Up:  Patient agrees to Care Plan and Follow-up.  Plan: The Managed Medicaid care management team will reach out to the patient again over the next 30 days. and The  Patient has been provided with contact information for the Managed Medicaid care management team and has been advised to call with any health related questions or concerns.  Date/time of next scheduled RN care management/care coordination outreach:  04/29/21 at 1230.

## 2021-03-30 NOTE — Patient Instructions (Signed)
Hi Sharon Steele, thank you for speaking with me today-have a great afternoon!!  Sharon Steele was given information about Medicaid Managed Care team care coordination services as a part of their [facility] Medicaid benefit. Sharon Steele verbally consented to engagement with the [facility] Managed Care team.   If you are experiencing a medical emergency, please call 911 or report to your local emergency department or urgent care.   If you have a non-emergency medical problem during routine business hours, please contact your provider's office and ask to speak with a nurse.   For questions related to your [facility] health plan, please call: [phone] or go here:[email or web address]  If you would like to schedule transportation through your [facility] plan, please call the following number at least 2 days in advance of your appointment: [phone].  Call the [facility] Crisis Line at [phone], at any time, 24 hours a day, 7 days a week. If you are in danger or need immediate medical attention call 911.  If you would like help to quit smoking, call 1-800-QUIT-NOW ((628)085-4849) OR Espaol: 1-855-Djelo-Ya (2-637-858-8502) o para ms informacin haga clic aqu or Text READY to 774-128 to register via text  Sharon Steele - following are the goals we discussed in your visit today:   Goals Addressed             This Visit's Progress    Protect My Health       Timeframe:  Long-Range Goal Priority:  Low Start Date:    11/16/20                         Expected End Date:    ongoing                   Follow Up Date: 04/29/21   - practice safe sex - schedule appointment for flu shot - schedule appointment for vaccines needed due to my age or health - schedule recommended health tests (blood work, mammogram, colonoscopy, pap test) - schedule and keep appointment for annual check-up    Why is this important?   Screening tests can find diseases early when they are  easier to treat.  Your doctor or nurse will talk with you about which tests are important for you.  Getting shots for common diseases like the flu and shingles will help prevent them.     Update 01/11/21:  Patient has appointment 01/18/21 with Dr. [name] for follow up-refill medications. Update 03/03/21:  Patient needs to see eye and dental provider-will make referral. Update 03/30/21:  No complaints today-being followed by Dermatologist, has appointment with SW tomorrow.   The patient verbalized understanding of instructions provided today and declined a print copy of patient instruction materials.   The Managed Medicaid care management team will reach out to the patient again over the next 30 days.  The  Patient has been provided with contact information for the Managed Medicaid care management team and has been advised to call with any health related questions or concerns.   Kathi Der RN, BSN [place]  [facility] Care Management Coordinator - Managed Medicaid High Risk [phone]   Following is a copy of your plan of care:  Care Plan : General Plan of Care (Adult)  Updates made by [name], RN since 03/30/2021 12:00 AM     Problem: Health Promotion or Disease Self-Management (General Plan of Care)   Priority: Low  Onset Date: 11/16/2020  Long-Range Goal: Self-Management Plan Developed   Start Date: 11/16/2020  Expected End Date: 06/03/2021  Recent Progress: On track  Priority: Low  Note:    Current Barriers:  Knowledge Deficits related to short term plan for care coordination needs and long term plans for chronic disease management needs.  Patient has started new medication for acne-also has hidradenitis.  Needs eye and dental provider resources.  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 Advised patient to contact Dermatologist , Dr. [name], about medication. Update 01/11/21:  Patient has appointment 01/18/21-continues to follow. Reviewed medications with patient. Discussed plans with patient for ongoing care management follow up and provided patient with direct contact information for care management team Reviewed scheduled/upcoming provider appointments. Collaborated with SW for dental and eye resources-has appointment 03/31/21 BSW referral for dental and eye providers. BSW referral for housing resources-completed. BSW contacted patient regarding housing resources. Patient states she is staying with her aunt but is still responsible for 400 in rent, and food. Patient states she would prefer to live in [place] due to her mom being there. Patient makes about 3000/monthly gross. Patient would like a 1bedroom in the 850 range. BSW will research and put a list of resources together for patient. No other resources needed at this time. 01/31/21: BSW completed research and located resources for patient. BSW contacted patient to provide resources. Patient stated she found and completed an application today for [employer] in [place]. Patient stated no other resources or services are needed at this time. Update 03/03/21: Patient states she moved to [place] 02/09/21-has roommates. Self Care Activities:  Patient will self administer medications as prescribed Patient will attend all scheduled provider appointments Patient will call pharmacy for medication refills Patient will continue to perform ADL's independently Patient will call provider office for new concerns or questions Patient Goals: In the next 90  days, patient will attend all scheduled appointments. - Follow Up Plan: RNCM will follow up with patient within 30 days.  Patient has been provided with contact information.   Evidence-based guidance:   Review biopsychosocial determinants of health screens.  Review need for preventive screening based on age, sex, family history and health history.  Determine level of modifiable health risk.  Discuss identified risks.  Identify areas where behavior change may lead to improved health.  Promote healthy lifestyle.  Evoke change talk using open-ended questions, pros and cons, as well as looking forward.  Identify and manage conditions or preconditions to reduce health risk.  Implement additional goals and interventions based on identified risk factors.

## 2021-03-31 ENCOUNTER — Other Ambulatory Visit: Payer: Self-pay

## 2021-03-31 NOTE — Patient Instructions (Signed)
Visit Information  Ms. Dasher was given information about Medicaid Managed Care team care coordination services as a part of their [facility] Medicaid benefit. Rodena Goldmann Meloni verbally consented to engagement with the [facility] Managed Care team.   If you are experiencing a medical emergency, please call 911 or report to your local emergency department or urgent care.   If you have a non-emergency medical problem during routine business hours, please contact your provider's office and ask to speak with a nurse.   For questions related to your [facility] health plan, please call: [phone] or go here:[email or web address]  If you would like to schedule transportation through your [facility] plan, please call the following number at least 2 days in advance of your appointment: [phone].  Call the [facility] Crisis Line at [phone], at any time, 24 hours a day, 7 days a week. If you are in danger or need immediate medical attention call 911.  If you would like help to quit smoking, call 1-800-QUIT-NOW ((478) 765-6091) OR Espaol: 1-855-Djelo-Ya (1-165-790-3833) o para ms informacin haga clic aqu or Text READY to 383-291 to register via text  Ms. Groleau - following are the goals we discussed in your visit today:   Goals Addressed   None     The  Patient                                              has been provided with contact information for the Managed Medicaid care management team and has been advised to call with any health related questions or concerns.   Gus Puma, BSW, [place] [facility]  [place]  High Risk Managed Medicaid Team  [phone]   Following is a copy of your plan of care:  Care Plan : General Plan of Care (Adult)  Updates made by Shaune Leeks since 03/31/2021 12:00 AM     Problem: Health Promotion or Disease Self-Management (General Plan of Care)   Priority: Low  Onset Date: 11/16/2020      Long-Range Goal: Self-Management Plan Developed   Start Date: 11/16/2020  Expected End Date: 06/03/2021  Recent Progress: On track  Priority: Low  Note:     Current Barriers:  Knowledge Deficits related to short term plan for care coordination needs and long term plans for chronic disease management needs.  Patient has started new medication for acne-also has hidradenitis.  Needs eye and dental provider resources.  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 Advised patient to contact Dermatologist , Dr. [name], about medication. Update 01/11/21:  Patient has appointment 01/18/21-continues to follow. Reviewed medications with patient. Discussed plans with patient for ongoing care management follow up and provided patient with direct contact information for care management team Reviewed scheduled/upcoming provider appointments. Collaborated with SW for dental and eye resources-has appointment 03/31/21 BSW referral for dental and eye providers. BSW referral for housing resources-completed. BSW contacted patient regarding housing resources. Patient states she is staying with her aunt but is still responsible for 400 in rent, and  food. Patient states she would prefer to live in [place] due to her mom being there. Patient makes about 3000/monthly gross. Patient would like a 1bedroom in the 850 range. BSW will research and put a list of resources together for patient. No other resources needed at this time. 01/31/21: BSW completed research and located resources for patient.  BSW contacted patient to provide resources. Patient stated she found and completed an application today for [employer] in [place]. Patient stated no other resources or services are needed at this time. Update 03/03/21: Patient states she moved to [place] 02/09/21-has roommates. 03/31/21: BSW contacted patient regarding finding an eye doctor and dentist. Patient asked for resources to be emailed to her at [email or web address]. Patient stated she has a dentist she just cannot remember the name but it is on [address]. In [place]. BSW will research and provide patient with the information. Self Care Activities:  Patient will self administer medications as prescribed Patient will attend all scheduled provider appointments Patient will call pharmacy for medication refills Patient will continue to perform ADL's independently Patient will call provider office for new concerns or questions Patient Goals: In the next 90 days, patient will attend all scheduled appointments. - Follow Up Plan: RNCM will follow up with patient within 30 days.  Patient has been provided with contact information.   Evidence-based guidance:   Review biopsychosocial determinants of health screens.  Review need for preventive screening based on age, sex, family history and health history.  Determine level of modifiable health risk.  Discuss identified risks.  Identify areas where behavior change may lead to improved health.  Promote healthy lifestyle.  Evoke change talk using open-ended questions, pros and cons, as well as looking forward.  Identify and manage conditions or preconditions to reduce health risk.  Implement additional goals and interventions based on identified risk factors.

## 2021-03-31 NOTE — Patient Outreach (Signed)
Medicaid Managed Care Social Work Note  03/31/2021 Name:  Sharon Steele MRN:  [id number] DOB:  15-Feb-2001  Sharon Steele is an 20 y.o. year old female who is a primary patient of [facility].  The Medicaid Managed Care Coordination team was consulted for assistance with:  Community Resources   Sharon Steele was given information about Medicaid Managed Care Coordination team services today. Sharon Steele Patient agreed to services and verbal consent obtained.  Engaged with patient  for by telephone forfollow up visit in response to referral for case management and/or care coordination services.   Assessments/Interventions:  Review of past medical history, allergies, medications, health status, including review of consultants reports, laboratory and other test data, was performed as part of comprehensive evaluation and provision of chronic care management services.  SDOH: (Social Determinant of Health) assessments and interventions performed:  03/31/21: BSW contacted patient regarding finding an eye doctor and dentist. Patient asked for resources to be emailed to her at [email or web address]. Patient stated she has a dentist she just cannot remember the name but it is on [place]. In [place]. BSW will research and provide patient with the information.   Advanced Directives Status:  Not addressed in this encounter.  Care Plan                 Allergies  Allergen Reactions   Cashew Nut Oil Hives, Itching and Swelling   Almond Oil     Medications Reviewed Today     Reviewed by Sharon Chandler, RN (Registered Nurse) on 03/30/21 at 1246  Med List Status: <None>   Medication Order Taking? Sig Documenting Provider Last Dose Status Informant  amoxicillin-clavulanate (AUGMENTIN) 875-125 MG tablet [id number]  Take 1 tablet by mouth every 12 (twelve) hours.  Patient not taking: Reported on 11/16/2020   Sharon Steele  Active     Discontinued 11/24/19  1034   clindamycin (CLEOCIN T) 1 % external solution [id number] Yes Apply topically 2 (two) times daily. [name], [name], MD Taking Active   ibuprofen (ADVIL) 200 MG tablet [id number]  Take 200 mg by mouth every 6 (six) hours as needed.  Patient not taking: Reported on 11/16/2020   [provider]  Active    Patient not taking:   Discontinued 11/24/19 1034 ISOtretinoin Micronized (ABSORICA LD) 24 MG CAPS [id number] Yes Take 2 capsules by mouth daily. [name], [name], MD Taking Active   norgestimate-ethinyl estradiol (ORTHO-CYCLEN) 0.25-35 MG-MCG tablet [id number] Yes Take 1 tablet by mouth daily. Sharon Bores, MD Taking Active   Pseudoephedrine-APAP-DM (DAYQUIL PO) [id number]  Take by mouth.  Patient not taking: Reported on 11/16/2020   [provider]  Active   sulfamethoxazole-trimethoprim (BACTRIM DS) 800-160 MG tablet [id number] Yes Take 1 tablet by mouth 2 (two) times daily for 7 days. For HS flare [name], [name], MD Taking Active   valACYclovir (VALTREX) 1000 MG tablet [id number] Yes Take 1 tablet (1,000 mg total) by mouth daily. Sharon Bores, MD Taking Active             Patient Active Problem List   Diagnosis Date Noted   LGSIL on Pap smear of cervix 08/05/2020   Menorrhagia with regular cycle 03/18/2020   Herpes simplex vulvovaginitis 03/18/2020   Hidradenitis 03/18/2020    Conditions to be addressed/monitored per PCP order:   eye and dental resources.  Care Plan : General Plan of Care (Adult)  Updates made by Sharon Steele since 03/31/2021 12:00  AM     Problem: Health Promotion or Disease Self-Management (General Plan of Care)   Priority: Low  Onset Date: 11/16/2020     Long-Range Goal: Self-Management Plan Developed   Start Date: 11/16/2020  Expected End Date: 06/03/2021  Recent Progress: On track  Priority: Low  Note:     Current Barriers:  Knowledge Deficits related to short term plan for care coordination needs and long term plans for chronic  disease management needs.  Patient has started new medication for acne-also has hidradenitis.  Needs eye and dental provider resources.  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 Advised patient to contact Dermatologist , Sharon Steele, about medication. Update 01/11/21:  Patient has appointment 01/18/21-continues to follow. Reviewed medications with patient. Discussed plans with patient for ongoing care management follow up and provided patient with direct contact information for care management team Reviewed scheduled/upcoming provider appointments. Collaborated with SW for dental and eye resources-has appointment 03/31/21 BSW referral for dental and eye providers. BSW referral for housing resources-completed. BSW contacted patient regarding housing resources. Patient states she is staying with her aunt but is still responsible for 400 in rent, and food. Patient states she would prefer to live in [place] due to her mom being there. Patient makes about 3000/monthly gross. Patient would like a 1bedroom in the [place] range. BSW will research and put a list of resources together for patient. No other resources needed at this time. 01/31/21: BSW completed research and located resources for patient. BSW contacted patient to provide resources. Patient stated she found and completed an application today for [employer] in [place]. Patient stated no other resources or services are needed at this time. Update 03/03/21: Patient states she moved to [employer] 02/09/21-has  roommates. 03/31/21: BSW contacted patient regarding finding an eye doctor and dentist. Patient asked for resources to be emailed to her at [email or web address]. Patient stated she has a dentist she just cannot remember the name but it is on [address]. In [place]. BSW will research and provide patient with the information. Self Care Activities:  Patient will self administer medications as prescribed Patient will attend all scheduled provider appointments Patient will call pharmacy for medication refills Patient will continue to perform ADL's independently Patient will call provider office for new concerns or questions Patient Goals: In the next 90 days, patient will attend all scheduled appointments. - Follow Up Plan: RNCM will follow up with patient within 30 days.  Patient has been provided with contact information.   Evidence-based guidance:   Review biopsychosocial determinants of health screens.  Review need for preventive screening based on age, sex, family history and health history.  Determine level of modifiable health risk.  Discuss identified risks.  Identify areas where behavior change may lead to improved health.  Promote healthy lifestyle.  Evoke change talk using open-ended questions, pros and cons, as well as looking forward.  Identify and manage conditions or preconditions to reduce health risk.  Implement additional goals and interventions based on identified risk factors.      Follow up:  Patient agrees to Care Plan and Follow-up.  Plan: The  Patient has been provided with  contact information for the Managed Medicaid care management team and has been advised to call with any health related questions or concerns.    Gus Puma, BSW, [facility]  [employer] Risk Managed Medicaid Team  [phone]

## 2021-04-07 ENCOUNTER — Ambulatory Visit: Payer: Medicaid Other | Admitting: Dermatology

## 2021-04-21 DIAGNOSIS — Z419 Encounter for procedure for purposes other than remedying health state, unspecified: Secondary | ICD-10-CM | POA: Diagnosis not present

## 2021-04-26 ENCOUNTER — Ambulatory Visit (INDEPENDENT_AMBULATORY_CARE_PROVIDER_SITE_OTHER): Payer: Medicaid Other | Admitting: Dermatology

## 2021-04-26 ENCOUNTER — Other Ambulatory Visit: Payer: Self-pay

## 2021-04-26 ENCOUNTER — Encounter: Payer: Self-pay | Admitting: Dermatology

## 2021-04-26 VITALS — Wt 151.0 lb

## 2021-04-26 DIAGNOSIS — Z79899 Other long term (current) drug therapy: Secondary | ICD-10-CM

## 2021-04-26 DIAGNOSIS — L732 Hidradenitis suppurativa: Secondary | ICD-10-CM

## 2021-04-26 DIAGNOSIS — K130 Diseases of lips: Secondary | ICD-10-CM | POA: Diagnosis not present

## 2021-04-26 DIAGNOSIS — L700 Acne vulgaris: Secondary | ICD-10-CM

## 2021-04-26 DIAGNOSIS — L720 Epidermal cyst: Secondary | ICD-10-CM

## 2021-04-26 DIAGNOSIS — L853 Xerosis cutis: Secondary | ICD-10-CM

## 2021-04-26 MED ORDER — SPIRONOLACTONE 100 MG PO TABS
ORAL_TABLET | ORAL | 3 refills | Status: DC
Start: 2021-04-26 — End: 2021-04-26

## 2021-04-26 MED ORDER — ABSORICA LD 24 MG PO CAPS: ORAL_CAPSULE | ORAL | 0 refills | Status: DC

## 2021-04-26 MED ORDER — ABSORICA LD 32 MG PO CAPS: ORAL_CAPSULE | ORAL | 0 refills | Status: DC

## 2021-04-26 MED ORDER — SPIRONOLACTONE 100 MG PO TABS: ORAL_TABLET | ORAL | 3 refills | Status: DC

## 2021-04-26 NOTE — Progress Notes (Signed)
Isotretinoin Follow-Up Visit   Subjective  Sharon Steele is a 20 y.o. female who presents for the following: Acne. Isotretinoin f/u taking Absorica LD 24 mg 2 tablets daily with a good response. +dry skin  Pt reports her HS is flaring under her breast, helped when she was taking Bactrim ds   Week # 12   Isotretinoin F/U - 04/26/21 1000       Isotretinoin Follow Up   iPledge # [id number]    Date 04/26/21    Weight 151 lb (68.5 kg)    Two Forms of Birth Control Oral Contraceptives (w/ estrogen);Female Condom    Acne breakouts since last visit? No      Dosage   Target Dosage (mg) 13700    Current (To Date) Dosage (mg) 3600    To Go Dosage (mg) 10100      Side Effects   Skin Chapped Lips;Dry Skin    Gastrointestinal WNL    Neurological WNL               Side effects: Dry skin, dry lips  Denies changes in night vision, shortness of breath, abdominal pain, nausea, vomiting, diarrhea, blood in stool or urine, visual changes, headaches, epistaxis, joint pain, myalgias, mood changes, depression, or suicidal ideation.   Patient is not pregnant, not seeking pregnancy, and not breastfeeding.   The following portions of the chart were reviewed this encounter and updated as appropriate: medications, allergies, medical history  Review of Systems:  No other skin or systemic complaints except as noted in HPI or Assessment and Plan.  Objective  Well appearing patient in no apparent distress; mood and affect are within normal limits.  An examination of the face, neck, chest, and back was performed and relevant findings are noted below.   Head - Anterior (Face) Face with few small cyst, rare open comedone, chest with trace open comedone, back with rare inflammatory papule   right inframammary Inflamed cyst at right inframammary Multiple scars and cysts inframammary bilateral  Right Inframammary Fold Tender erythematous papules   Assessment & Plan   Acne vulgaris Head  - Anterior (Face)  Severe and Chronic (present >1 year); currently on Isotretinoin and not to goal (must reach target dose based on weight and also have clear skin for 2 months prior to discontinuation in order to help prevent relapse)  Urine pregnancy test performed in office today and was negative.  Patient demonstrates comprehension and confirms she will not get pregnant.    Increase to Absorica LD 24 mg once a day and Absorica LD 32 mg once a day (equivalent to 70 mg daily of regular isotretinoin)  Ipledge [phone] [facility]  2 forms of birth control, oral contraceptives, female latex condoms Total 3,600 Total mg/kg  53   Hidradenitis suppurativa right inframammary  Chronic condition with duration or expected duration over one year. Condition is bothersome to patient. Currently flared.  Hidradenitis Suppurativa is a chronic; persistent; non-curable, but treatable condition due to abnormal inflamed sweat glands in the body folds (axilla, inframammary, groin, medial thighs), causing recurrent painful draining cysts and scarring. It can be associated with severe scarring acne and cysts; also abscesses and scarring of scalp. The goal is control and prevention of flares, as it is not curable. Scars are permanent and can be thickened. Treatment may include daily use of topical medication and oral antibiotics.  Oral isotretinoin may also be helpful.  For more severe cases, Humira (a biologic injection) may be prescribed  to decrease the inflammatory process and prevent flares.  When indicated, inflamed cysts may also be treated surgically.   Treatment options including Spironolactone, humira discussed  Reviewed risks of biologics including immunosuppression, infections, injection site reaction, and failure to improve condition. Goal is control of skin condition, not cure.  Some older biologics such as Humira may slightly increase risk of malignancy and may worsen congestive heart failure.  The use of biologics requires long term medication management, including periodic office visits and monitoring of blood work.   Pt defers Humira, will try consistent spironolactone first  Restart Spironolactone 100 mg take 1 tablet at bedtime  Spironolactone can cause increased urination and cause blood pressure to decrease. Please watch for signs of lightheadedness and be cautious when changing position. It can sometimes cause breast tenderness or an irregular period in premenopausal women. It can also increase potassium. The increase in potassium usually is not a concern unless you are taking other medicines that also increase potassium, so please be sure your doctor knows all of the other medications you are taking. This medication should not be taken by pregnant women.  This medicine should also not be taken together with sulfa drugs like Bactrim (trimethoprim/sulfamethexazole).    Start samples of Winlevi twice a day   Related Medications spironolactone (ALDACTONE) 100 MG tablet Take 1 tablet at bedtime  Epidermal cyst Right Inframammary Fold  Secondary to HS  Intralesional injection - Right Inframammary Fold Location: right inframammary   Informed Consent: Discussed risks (infection, pain, bleeding, bruising, thinning of the skin, loss of skin pigment, lack of resolution, and recurrence of lesion) and benefits of the procedure, as well as the alternatives. Informed consent was obtained. Preparation: The area was prepared a standard fashion.  Anesthesia: none   Procedure Details: An intralesional injection was performed with Kenalog 2 mg/cc. 0.2 cc in total were injected.  Total number of injections: 3  Plan: The patient was instructed on post-op care. Recommend OTC analgesia as needed for pain.   Xerosis secondary to isotretinoin therapy - Continue emollients as directed  Cheilitis secondary to isotretinoin therapy - Continue lip balm as directed, Dr. Clayborne Artist Cortibalm  recommended  Long term medication management (isotretinoin) - While taking Isotretinoin and for 30 days after you finish the medication, do not get pregnant, do not share pills, do not donate blood. Isotretinoin is best absorbed when taken with a fatty meal. Isotretinoin can make you sensitive to the sun. Daily careful sun protection including sunscreen SPF 30+ when outdoors is recommended.  Follow-up in 30 days.  I, Angelique Holm, CMA, am acting as scribe for Darden Dates, MD .   Documentation: I have reviewed the above documentation for accuracy and completeness, and I agree with the above.  Darden Dates, MD

## 2021-04-26 NOTE — Patient Instructions (Addendum)
Spironolactone can cause increased urination and cause blood pressure to decrease. Please watch for signs of lightheadedness and be cautious when changing position. It can sometimes cause breast tenderness or an irregular period in premenopausal women. It can also increase potassium. The increase in potassium usually is not a concern unless you are taking other medicines that also increase potassium, so please be sure your doctor knows all of the other medications you are taking. This medication should not be taken by pregnant women.  This medicine should also not be taken together with sulfa drugs like Bactrim (trimethoprim/sulfamethexazole).     While taking Isotretinoin and for 30 days after you finish the medication, do not get pregnant, do not share pills, do not donate blood.  Generic isotretinoin is best absorbed when taken with a fatty meal. Isotretinoin can make you sensitive to the sun. Daily careful sun protection including sunscreen SPF 30+ when outdoors is recommended.    If You Need Anything After Your Visit  If you have any questions or concerns for your doctor, please call our main line at [phone] and press option 4 to reach your doctor's medical assistant. If no one answers, please leave a voicemail as directed and we will return your call as soon as possible. Messages left after 4 pm will be answered the following business day.   You may also send [place] a message via MyChart. We typically respond to MyChart messages within 1-2 business days.  For prescription refills, please ask your pharmacy to contact our office. Our fax number is [phone].  If you have an urgent issue when the clinic is closed that cannot wait until the next business day, you can page your doctor at the number below.    Please note that while we do our best to be available for urgent issues outside of office hours, we are not available 24/7.   If you have an urgent issue and are unable to reach [place], you  may choose to seek medical care at your doctor's office, retail clinic, urgent care center, or emergency room.  If you have a medical emergency, please immediately call 911 or go to the emergency department.  Pager Numbers  - Dr. Gwen Pounds: [phone]  - Dr. [name]: [phone]  - Dr. Roseanne Reno: [phone]  In the event of inclement weather, please call our main line at [phone] for an update on the status of any delays or closures.  Dermatology Medication Tips: Please keep the boxes that topical medications come in in order to help keep track of the instructions about where and how to use these. Pharmacies typically print the medication instructions only on the boxes and not directly on the medication tubes.   If your medication is too expensive, please contact our office at [phone] option 4 or send [place] a message through MyChart.   We are unable to tell what your co-pay for medications will be in advance as this is different depending on your insurance coverage. However, we may be able to find a substitute medication at lower cost or fill out paperwork to get insurance to cover a needed medication.   If a prior authorization is required to get your medication covered by your insurance company, please allow [place] 1-2 business days to complete this process.  Drug prices often vary depending on where the prescription is filled and some pharmacies may offer cheaper prices.  The website www.goodrx.com contains coupons for medications through different pharmacies. The prices here do not account for what  the cost may be with help from insurance (it may be cheaper with your insurance), but the website can give you the price if you did not use any insurance.  - You can print the associated coupon and take it with your prescription to the pharmacy.  - You may also stop by our office during regular business hours and pick up a GoodRx coupon card.  - If you need your prescription sent  electronically to a different pharmacy, notify our office through [facility] or by phone at [phone] option 4.     Si Usted Necesita Algo Despus de Su Visita  Tambin puede enviarnos un mensaje a travs de Clinical cytogeneticist. Por lo general respondemos a los mensajes de MyChart en el transcurso de 1 a 2 das hbiles.  Para renovar recetas, por favor pida a su farmacia que se ponga en contacto con nuestra oficina. Annie Sable de fax es [place] [phone].  Si tiene un asunto urgente cuando la clnica est cerrada y que no puede esperar hasta el siguiente da hbil, puede llamar/localizar a su doctor(a) al nmero que aparece a continuacin.   Por favor, tenga en cuenta que aunque hacemos todo lo posible para estar disponibles para asuntos urgentes fuera del horario de [place], no estamos disponibles las 24 horas del da, los 7 [address]  [address] de la [place].   Si tiene un problema urgente y no puede comunicarse con nosotros, puede optar por buscar atencin mdica  en el consultorio de su doctor(a), en una clnica privada, en un centro de atencin urgente o en una sala de emergencias.  Si tiene Engineer, drilling, por favor llame inmediatamente al 911 o vaya a la sala de emergencias.  Nmeros de bper  - Dr. Gwen Pounds: [phone]  - Dra. [name]: [phone]  - Dra. Roseanne Reno: [phone]  En caso de inclemencias del [place], por favor llame a Lacy Duverney principal al [phone] para una actualizacin sobre el [place] de cualquier retraso o cierre.  Consejos para la medicacin en dermatologa: Por favor, guarde las cajas en las que vienen los medicamentos de uso tpico para ayudarle a seguir las instrucciones sobre dnde y cmo usarlos. Las farmacias generalmente imprimen las instrucciones del medicamento slo en las cajas y no directamente en los tubos del .   Si su medicamento es muy caro, por favor, pngase en contacto con Rolm Gala llamando al [phone] y presione la  opcin 4 o envenos un mensaje a travs de Clinical cytogeneticist.   No podemos decirle cul ser su copago por los medicamentos por adelantado ya que esto es diferente dependiendo de la cobertura de su seguro. Sin embargo, es posible que podamos encontrar un medicamento sustituto a Audiological scientist un formulario para que el seguro cubra el medicamento que se considera necesario.   Si se requiere una autorizacin previa para que su compaa de seguros [place] su medicamento, por favor permtanos de 1 a 2 das hbiles para completar [address]  Los precios de los medicamentos varan con frecuencia dependiendo del Environmental consultant de dnde se surte la receta y alguna farmacias pueden ofrecer precios ms baratos.  El sitio web www.goodrx.com tiene cupones para medicamentos de Health and safety inspector. Los precios aqu no tienen en cuenta lo que podra costar con la ayuda del seguro (puede ser ms barato con su seguro), pero el sitio web puede darle el precio si no utiliz Tourist information centre manager.  - Puede imprimir el cupn correspondiente y llevarlo con su receta a la farmacia.  - Tambin puede pasar  por nuestra oficina durante el horario de atencin regular y recoger una tarjeta de cupones de GoodRx.  - Si necesita que su receta se enve electrnicamente a una farmacia diferente, informe a nuestra oficina a travs de MyChart de [place] o por telfono llamando al [phone] y presione la opcin 4.  

## 2021-04-29 ENCOUNTER — Other Ambulatory Visit: Payer: Self-pay | Admitting: Obstetrics and Gynecology

## 2021-04-29 ENCOUNTER — Other Ambulatory Visit: Payer: Self-pay

## 2021-04-29 NOTE — Patient Outreach (Signed)
Medicaid Managed Care   Nurse Care Manager Note  04/29/2021 Name:  Sharon Steele MRN:  [id number] DOB:  2001-03-15  Sharon Steele is an 20 y.o. year old female who is a primary patient of [facility].  The [facility] Managed Care Coordination team was consulted for assistance with:    Healthcare management needs.  [name] was given information about Medicaid Managed Care Coordination team services today. Sharon Steele Patient agreed to services and verbal consent obtained.  Engaged with patient by telephone for follow up visit in response to provider referral for case management and/or care coordination services.   Assessments/Interventions:  Review of past medical history, allergies, medications, health status, including review of consultants reports, laboratory and other test data, was performed as part of comprehensive evaluation and provision of chronic care management services.  SDOH (Social Determinants of Health) assessments and interventions performed: SDOH Interventions    Flowsheet Row Most Recent Value  SDOH Interventions   Transportation Interventions Intervention Not Indicated       Care Plan  Allergies  Allergen Reactions   Cashew Nut Oil Hives, Itching and Swelling   Almond Oil     Medications Reviewed Today     Reviewed by [name], RN (Registered Nurse) on 04/29/21 at 1228  Med List Status: <None>   Medication Order Taking? Sig Documenting Provider Last Dose Status Informant  amoxicillin-clavulanate (AUGMENTIN) 875-125 MG tablet [id number]  Take 1 tablet by mouth every 12 (twelve) hours.  Patient not taking: Reported on 11/16/2020   [name]  Active     Discontinued 11/24/19 1034   clindamycin (CLEOCIN T) 1 % external solution [id number] Yes Apply topically 2 (two) times daily. [name], [name], MD Taking Active   ibuprofen (ADVIL) 200 MG tablet [id number]  Take 200 mg by mouth every 6 (six) hours as needed.   Patient not taking: Reported on 11/16/2020   [provider]  Active    Patient not taking:   Discontinued 11/24/19 1034 ISOtretinoin Micronized (ABSORICA LD) 24 MG CAPS [id number] No Take 1 tablet daily, with 1 Absorica LD 32 mg  Patient not taking: Reported on 04/29/2021   [name], [name], MD Not Taking Active   ISOtretinoin Micronized (ABSORICA LD) 32 MG CAPS [id number] Yes Take 1 tablet daily with 1 Absorica LD 24 mg tablet [name], [name], MD Taking Active   norgestimate-ethinyl estradiol (ORTHO-CYCLEN) 0.25-35 MG-MCG tablet [id number] Yes Take 1 tablet by mouth daily. [name], MD Taking Active   Pseudoephedrine-APAP-DM (DAYQUIL PO) [id number]  Take by mouth.  Patient not taking: Reported on 11/16/2020   [provider]  Active   spironolactone (ALDACTONE) 100 MG tablet [id number] Yes Take 1 tablet at bedtime [name], [name], MD Taking Active   valACYclovir (VALTREX) 1000 MG tablet [id number] Yes Take 1 tablet (1,000 mg total) by mouth daily. 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   Hidradenitis 03/18/2020    Conditions to be addressed/monitored per PCP order:   healthcare management needs, hidradenitis, acne  Care Plan : General Plan of Care (Adult)  Updates made by [name], RN since 04/29/2021 12:00 AM     Problem: Health Promotion or Disease Self-Management (General Plan of Care)  Priority: Low  Onset Date: 11/16/2020     Long-Range Goal: Self-Management Plan Developed   Start Date: 11/16/2020  Expected End Date: 06/03/2021  Recent Progress: On track  Priority: Low  Note:    Current Barriers:  Knowledge Deficits related to short term plan for care coordination needs and long term plans for chronic disease management needs.  Patient has started new medication for  acne-also has hidradenitis.  Needs eye provider resources.  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 Reviewed medications with patient. Discussed plans with patient for ongoing care management follow up and provided patient with direct contact information for care management team Reviewed scheduled/upcoming provider appointments. Collaborated with SW for dental and eye resources BSW referral for dental and eye providers. 04/29/21: discussed dentist with patient-patient states she needs eye provider resources-will follow up. BSW referral for housing resources-completed. BSW contacted patient regarding housing resources. Patient states she is staying with her aunt but is still responsible for 400 in rent, and food. Patient states she would prefer to live in [place] due to her mom being there. Patient makes about 3000/monthly gross. Patient would like a 1bedroom [place]. BSW will research and put a list of resources together for patient. No other resources needed at this time. 01/31/21: BSW completed research and located resources for patient. BSW contacted patient to provide resources. Patient stated she found and completed an application today for [employer] in [place]. Patient stated no other resources or services are needed at this time. Update 03/03/21: Patient states she moved to [employer] 02/09/21-has roommates. 03/31/21: BSW contacted patient regarding finding an eye doctor and dentist. Patient asked for resources to be emailed to her at  [email or web address]. Patient stated she has a dentist she just cannot remember the name but it is on [address]. In [place]. BSW will research and provide patient with the information. Self Care Activities:  Patient will self administer medications as prescribed Patient will attend all scheduled provider appointments Patient will call pharmacy for medication refills Patient will continue to perform ADL's independently Patient will call provider office for new concerns or questions Patient Goals: In the next 90 days, patient will attend all scheduled appointments. - Follow Up Plan: RNCM will follow up with patient within 30 days.  Patient has been provided with contact information.   Evidence-based guidance:   Review biopsychosocial determinants of health screens.  Review need for preventive screening based on age, sex, family history and health history.  Determine level of modifiable health risk.  Discuss identified risks.  Identify areas where behavior change may lead to improved health.  Promote healthy lifestyle.  Evoke change talk using open-ended questions, pros and cons, as well as looking forward.  Identify and manage conditions or preconditions to reduce health risk.  Implement additional goals and interventions based on identified risk factors.     Follow Up:  Patient agrees to Care Plan and Follow-up.  Plan: The Managed Medicaid care management team will reach out to the patient again over the next 30 days. and The  Patient has been provided with contact information for the Managed Medicaid care management team and  has been advised to call with any health related questions or concerns.  Date/time of next scheduled RN care management/care coordination outreach:  05/30/21 at 1230

## 2021-04-29 NOTE — Patient Instructions (Signed)
Hi Sharon Steele, thanks for talking with me today-have a great afternoon and weekend!!  Sharon Steele was given information about Medicaid Managed Care team care coordination services as a part of their [facility] Medicaid benefit. Sharon Steele verbally consented to engagement with the [facility] Managed Care team.   If you are experiencing a medical emergency, please call 911 or report to your local emergency department or urgent care.   If you have a non-emergency medical problem during routine business hours, please contact your provider's office and ask to speak with a nurse.   For questions related to your [facility] health plan, please call: [phone] or go here:[email or web address]  If you would like to schedule transportation through your [facility] plan, please call the following number at least 2 days in advance of your appointment: [phone].  Call the [facility] Crisis Line at [phone], at any time, 24 hours a day, 7 days a week. If you are in danger or need immediate medical attention call 911.  If you would like help to quit smoking, call 1-800-QUIT-NOW (416-616-4841) OR Espaol: 1-855-Djelo-Ya (9-485-462-7035) o para ms informacin haga clic aqu or Text READY to 009-381 to register via text  Sharon Steele - following are the goals we discussed in your visit today:   Goals Addressed             This Visit's Progress    Protect My Health       Timeframe:  Long-Range Goal Priority:  Low Start Date:    11/16/20                         Expected End Date:    ongoing                   Follow Up Date: 05/30/21   - practice safe sex - schedule appointment for flu shot - schedule appointment for vaccines needed due to my age or health - schedule recommended health tests (blood work, mammogram, colonoscopy, pap test) - schedule and keep appointment for annual check-up    Why is this important?   Screening tests can find diseases early when  they are easier to treat.  Your doctor or nurse will talk with you about which tests are important for you.  Getting shots for common diseases like the flu and shingles will help prevent them.     04/29/21:  patient needs eye provider that accepts her insurance for new glasses-will follow up on referral.    The patient verbalized understanding of instructions provided today and declined a print copy of patient instruction materials.   The Managed Medicaid care management team will reach out to the patient again over the next 30 days.  The  Patient  has been provided with contact information for the Managed Medicaid care management team and has been advised to call with any health related questions or concerns.   Sharon Der RN, BSN [facility] - Managed Medicaid High Risk [phone].   Following is a copy of your plan of care:  Care Plan : General Plan of Care (Adult)  Updates made by [name], RN since 04/29/2021 12:00 AM     Problem: Health Promotion or Disease Self-Management (General Plan of Care)   Priority: Low  Onset Date: 11/16/2020     Long-Range Goal: Self-Management Plan Developed   Start Date: 11/16/2020  Expected End Date: 06/03/2021  Recent  Progress: On track  Priority: Low  Note:    Current Barriers:  Knowledge Deficits related to short term plan for care coordination needs and long term plans for chronic disease management needs.  Patient has started new medication for acne-also has hidradenitis.  Needs eye provider resources.  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 Reviewed medications with patient. Discussed plans with patient for ongoing care management follow up and provided patient with direct contact information for care management team Reviewed scheduled/upcoming provider appointments. Collaborated with SW for dental and eye resources BSW referral for dental and eye providers. 04/29/21: discussed dentist with patient-patient states she needs eye provider resources-will follow up. BSW referral for housing resources-completed. BSW contacted patient regarding housing resources. Patient states she is staying with her aunt but is still responsible for 400 in rent, and food. Patient states she would prefer to live in [place] due to her mom being there. Patient makes about 3000/monthly gross. Patient would like a 1bedroom in the 850 range. BSW will research and put a list of resources together for patient. No other resources needed at this time. 01/31/21: BSW completed research and located resources for patient. BSW contacted patient to provide resources. Patient stated she found and completed an application today for [employer] in [place]. Patient stated no other resources or services are needed at this time. Update 03/03/21: Patient states she moved to [place] 02/09/21-has roommates. 03/31/21: BSW contacted patient regarding finding an eye doctor and dentist. Patient asked for resources to be emailed to her at [email or web address]. Patient stated she has a dentist she just cannot remember the name but it is on [address]. In [place]. BSW will research and provide patient with the information. Self Care Activities:  Patient will self administer medications as prescribed Patient will attend all scheduled provider appointments Patient will call pharmacy for medication refills Patient will continue to perform ADL's independently Patient  will call provider office for new concerns or questions Patient Goals: In the next 90 days, patient will attend all scheduled appointments. - Follow Up Plan: RNCM will follow up with patient within 30 days.  Patient has been provided with contact information.   Evidence-based guidance:   Review biopsychosocial determinants of health screens.  Review need for preventive screening based on age, sex, family history and health history.  Determine level of modifiable health risk.  Discuss identified risks.  Identify areas where behavior change may lead to improved health.  Promote healthy lifestyle.  Evoke change talk using open-ended questions, pros and cons, as well as looking forward.  Identify and manage conditions or preconditions to reduce health risk.  Implement additional goals and interventions based on identified risk factors.

## 2021-05-04 ENCOUNTER — Other Ambulatory Visit: Payer: Self-pay

## 2021-05-04 NOTE — Patient Instructions (Signed)
Visit Information  Ms. Sharon Steele  - as a part of your Medicaid benefit, you are eligible for care management and care coordination services at no cost or copay. I was unable to reach you by phone today but would be happy to help you with your health related needs. Please feel free to call me @ [phone]. I have resent those resources to your email address. If you have any questions please give me a call or you can respond to the email.    Gus Puma, BSW, [facility]   [employer] Risk Managed Medicaid Team  [phone]

## 2021-05-04 NOTE — Patient Outreach (Signed)
Care Coordination  05/04/2021  Sharon Steele Aug 05, 2000 [id number]   Medicaid Managed Care   Unsuccessful Outreach Note  05/04/2021 Name: Sharon Steele MRN: [id number] DOB: 2001/01/08  Referred by: [facility] Reason for referral : High Risk Managed Medicaid (MM Social Work Telephone Unsuccessful [employer])   An unsuccessful telephone outreach was attempted today. The patient was referred to the case management team for assistance with care management and care coordination.   Follow Up Plan: The patient has been provided with contact information for the care management team and has been advised to call with any health related questions or concerns.   Gus Puma, BSW, [facility]   [employer] Risk Managed Medicaid Team  [phone]

## 2021-05-22 DIAGNOSIS — Z419 Encounter for procedure for purposes other than remedying health state, unspecified: Secondary | ICD-10-CM | POA: Diagnosis not present

## 2021-05-24 IMAGING — CT CT ANGIO NECK
1 of 10 series · 6 of 33 positions shown · IV contrast (omnipaque)
Comparison: Report of CT head without contrast 08/30/2000.

CLINICAL DATA: Severe headaches. Migraine headaches for 1 month.
Sensitivity to light. Family history of aneurysms.

EXAM:
CT ANGIOGRAPHY HEAD AND NECK
TECHNIQUE: Multidetector CT imaging of the head and neck was performed using
the standard protocol during bolus administration of intravenous
contrast. Multiplanar CT image reconstructions and MIPs were
obtained to evaluate the vascular anatomy. Carotid stenosis
measurements (when applicable) are obtained utilizing NASCET
criteria, using the distal internal carotid diameter as the
denominator.
CONTRAST:  100mL OMNIPAQUE IOHEXOL 350 MG/ML SOLN

[Series 11: ax thin · axial · 0.39mm/px · z∈[-383,-154]mm · 6 of 321 slices shown]
[im 46/321  soft-tissue]
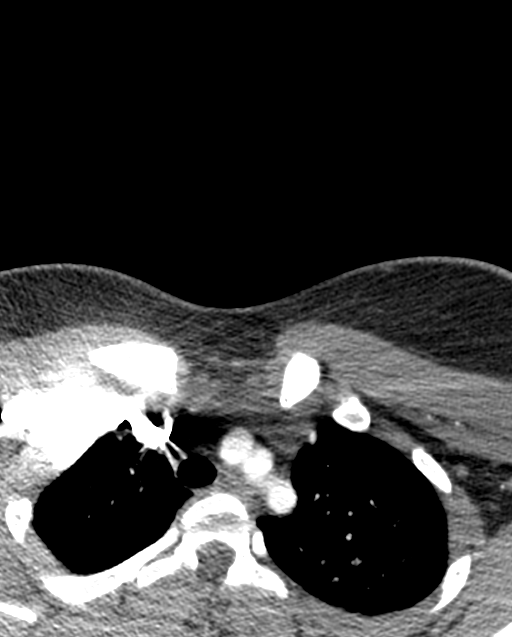
[im 92/321  bone]
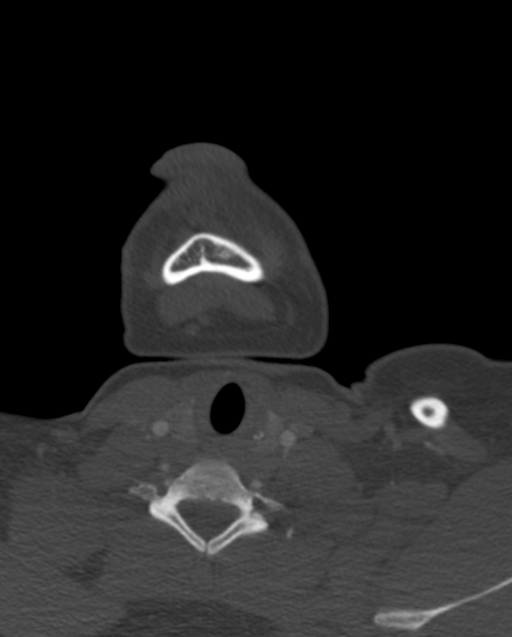
[im 138/321  soft-tissue]
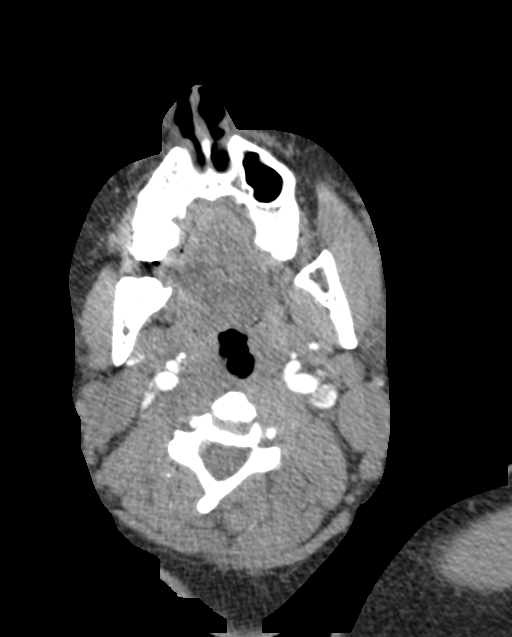
[im 183/321  bone]
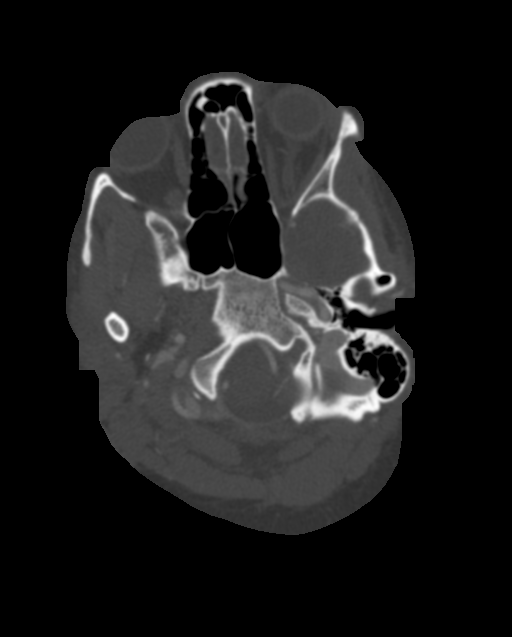
[im 229/321  soft-tissue]
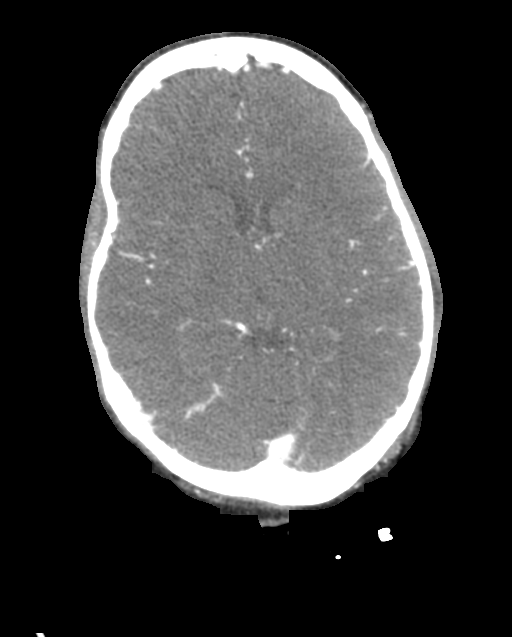
[im 275/321  bone]
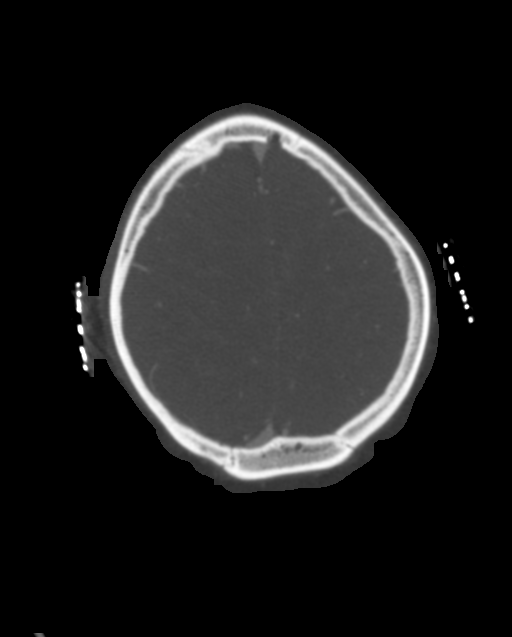

[6 of 33 positions shown; findings below may reference images not displayed]

FINDINGS: CT HEAD FINDINGS

Brain: No acute infarct, hemorrhage, or mass lesion is present. No
significant white matter lesions are present. The ventricles are of
normal size. No significant extraaxial fluid collection is present.

Vascular: No hyperdense vessel or unexpected calcification.

Skull: Calvarium is intact. No focal lytic or blastic lesions are
present. No significant extracranial soft tissue lesion is present.

Sinuses: The paranasal sinuses and mastoid air cells are clear.

Orbits: The globes and orbits are within normal limits.

Review of the MIP images confirms the above findings

CTA NECK FINDINGS

Aortic arch: 3 vessel arch configuration present. No significant
stenosis or aneurysm present.

Right carotid system: Right common carotid artery is within normal
limits. Bifurcation is unremarkable. Cervical right ICA is within
normal limits.

Left carotid system: The left common carotid artery is within normal
limits. Bifurcation is unremarkable. Cervical left ICA normal.

Vertebral arteries: The left vertebral artery is the dominant
vessel. Both vertebral arteries originate from the subclavian
arteries without significant stenosis. There is no significant
stenosis of either vertebral artery in the neck.

Skeleton: Straightening and some reversal of normal cervical
lordosis is noted. No significant listhesis.

Other neck: Soft tissues of the neck are otherwise unremarkable.

Upper chest: Lung apices are clear. The thoracic inlet is within
normal limits.

Review of the MIP images confirms the above findings

CTA HEAD FINDINGS

Anterior circulation: The internal carotid arteries are within
normal limits from the skull base through the ICA termini
bilaterally. The A1 and M1 segments are normal. Anterior
communicating artery is patent. ACA and MCA branch vessels are
within. MCA bifurcations are.

Posterior circulation: The left vertebral artery is the dominant
vessel. PICA origin visualized and within normal limits bilaterally.
The vertebrobasilar junction normal. Basilar artery is within normal
limits. Both posterior cerebral arteries originate from the basilar
tip. Right posterior communicating artery contributes. PCA branch
vessels are within normal limits.

Venous sinuses: Dural sinuses are patent. The right transverse sinus
is dominant. Straight sinus deep cerebral veins are intact. Cortical
veins are unremarkable.

Anatomic variants: None

Review of the MIP images confirms the above findings
IMPRESSION: 1. Normal variant CTA Circle of Willis without significant proximal
stenosis, aneurysm, or branch vessel occlusion.
2. Normal CTA of the neck.
3. Normal noncontrast CT of the head. No acute or focal lesion to
explain the patient's headaches.

## 2021-05-30 ENCOUNTER — Other Ambulatory Visit: Payer: Self-pay | Admitting: Obstetrics and Gynecology

## 2021-05-30 ENCOUNTER — Other Ambulatory Visit: Payer: Self-pay

## 2021-05-30 NOTE — Patient Instructions (Signed)
Hi Ms. Benney, nice to speak with you today-have a great afternoon!  Ms. Shultis was given information about Medicaid Managed Care team care coordination services as a part of their [facility] Medicaid benefit. Rodena Goldmann Brouhard verbally consented to engagement with the [facility] Managed Care team.   If you are experiencing a medical emergency, please call 911 or report to your local emergency department or urgent care.   If you have a non-emergency medical problem during routine business hours, please contact your provider's office and ask to speak with a nurse.   For questions related to your [facility] health plan, please call: [phone] or go here:[email or web address]  If you would like to schedule transportation through your [facility] plan, please call the following number at least 2 days in advance of your appointment: [phone].  Call the [facility] Crisis Line at [phone], at any time, 24 hours a day, 7 days a week. If you are in danger or need immediate medical attention call 911.  If you would like help to quit smoking, call 1-800-QUIT-NOW (740-731-8707) OR Espaol: 1-855-Djelo-Ya (2-831-517-6160) o para ms informacin haga clic aqu or Text READY to 737-106 to register via text  Ms. Bernhard - following are the goals we discussed in your visit today:   Goals Addressed             This Visit's Progress    Protect My Health       Timeframe:  Long-Range Goal Priority:  Low Start Date:    11/16/20                         Expected End Date:    ongoing                   Follow Up Date: 06/30/21   - practice safe sex - schedule appointment for flu shot - schedule appointment for vaccines needed due to my age or health - schedule recommended health tests (blood work, mammogram, colonoscopy, pap test) - schedule and keep appointment for annual check-up    Why is this important?   Screening tests can find diseases early when they are easier to  treat.  Your doctor or nurse will talk with you about which tests are important for you.  Getting shots for common diseases like the flu and shingles will help prevent them.     05/30/21:  Patient has received eye provider resources.  Has appointment with Dr. [name] 06/01/21 for follow up.    The patient verbalized understanding of instructions provided today and declined a print copy of patient instruction materials.   The Managed Medicaid care management team will reach out to the patient again over the next 30 days.  The  Patient  has been provided with contact information for the Managed Medicaid care management team and has been advised to call with any health related questions or concerns.   Kathi Der RN, BSN [place]   [facility] - Managed Medicaid High Risk [phone].   Following is a copy of your plan of care:  Care Plan : General Plan of Care (Adult)  Updates made by [name], RN since 05/30/2021 12:00 AM     Problem: Health Promotion or Disease Self-Management (General Plan of Care)   Priority: Low  Onset Date: 11/16/2020     Long-Range Goal: Self-Management Plan Developed   Start Date: 11/16/2020  Expected End Date: 08/28/2021  Recent  Progress: On track  Priority: Low  Note:    Current Barriers:  Knowledge Deficits related to short term plan for care coordination needs and long term plans for chronic disease management needs.  05/30/21:  Patient has received eye provider resources, no needs today. Nurse Case Manager Clinical Goal(s):  patient will work with care management team to address care coordination and chronic disease management needs related to Disease Management Educational Needs Care Coordination Medication Management and Education Medication Reconciliation Medication Assistance  Psychosocial Support   Interventions:  Evaluation of current treatment plan and patient's adherence to plan as established by  provider. Reviewed medications with patient. Discussed plans with patient for ongoing care management follow up and provided patient with direct contact information for care management team Reviewed scheduled/upcoming provider appointments. Collaborated with SW for dental and eye resources BSW referral for dental and eye providers-completed BSW referral for housing resources-completed. BSW contacted patient regarding housing resources. Patient states she is staying with her aunt but is still responsible for 400 in rent, and food. Patient states she would prefer to live in [place] due to her mom being there. Patient makes about 3000/monthly gross. Patient would like a 1bedroom [place]. BSW will research and put a list of resources together for patient. No other resources needed at this time. 01/31/21: BSW completed research and located resources for patient. BSW contacted patient to provide resources. Patient stated she found and completed an application today for [employer] in [place]. Patient stated no other resources or services are needed at this time. Update 03/03/21: Patient states she moved to [employer] 02/09/21-has roommates. 03/31/21: BSW contacted patient regarding finding an eye doctor and dentist. Patient asked for resources to be emailed to her at [email or web address]. Patient stated she has a dentist she just cannot remember the name but it is on [address]. In [place]. BSW will research and provide patient with the information. Self Care Activities:  Patient will self administer medications as prescribed Patient will attend all scheduled provider appointments Patient will call pharmacy for medication refills Patient will continue to perform ADL's independently Patient will call provider office for new concerns or questions Patient Goals: In the next 90 days, patient will attend all scheduled appointments. - Follow Up Plan: RNCM will follow up with  patient within 30 days.  Patient has been provided with contact information.   Evidence-based guidance:   Review biopsychosocial determinants of health screens.  Review need for preventive screening based on age, sex, family history and health history.  Determine level of modifiable health risk.  Discuss identified risks.  Identify areas where behavior change may lead to improved health.  Promote healthy lifestyle.  Evoke change talk using open-ended questions, pros and cons, as well as looking forward.  Identify and manage conditions or preconditions to reduce health risk.  Implement additional goals and interventions based on identified risk factors.

## 2021-05-30 NOTE — Patient Outreach (Signed)
Medicaid Managed Care   Nurse Care Manager Note  05/30/2021 Name:  Sharon Steele MRN:  [id number] DOB:  2000-08-26  Sharon Steele is an 21 y.o. year old female who is a primary patient of [facility].  The [facility] Managed Care Coordination team was consulted for assistance with:    Healthcare management needs.  [name] was given information about Medicaid Managed Care Coordination team services today. Sharon Steele Patient agreed to services and verbal consent obtained.  Engaged with patient by telephone for follow up visit in response to provider referral for case management and/or care coordination services.   Assessments/Interventions:  Review of past medical history, allergies, medications, health status, including review of consultants reports, laboratory and other test data, was performed as part of comprehensive evaluation and provision of chronic care management services.  SDOH (Social Determinants of Health) assessments and interventions performed: SDOH Interventions    Flowsheet Row Most Recent Value  SDOH Interventions   Intimate Partner Violence Interventions Intervention Not Indicated       Care Plan  Allergies  Allergen Reactions   Cashew Nut Oil Hives, Itching and Swelling   Almond Oil     Medications Reviewed Today     Reviewed by [name], RN (Registered Nurse) on 05/30/21 at 1249  Med List Status: <None>   Medication Order Taking? Sig Documenting Provider Last Dose Status Informant  amoxicillin-clavulanate (AUGMENTIN) 875-125 MG tablet [id number] No Take 1 tablet by mouth every 12 (twelve) hours.  Patient not taking: Reported on 11/16/2020   [name] Not Taking Active     Discontinued 11/24/19 1034   clindamycin (CLEOCIN T) 1 % external solution [id number] No Apply topically 2 (two) times daily. [name], [name], MD Taking Active   ibuprofen (ADVIL) 200 MG tablet [id number] No Take 200 mg by mouth every 6  (six) hours as needed.  Patient not taking: Reported on 11/16/2020   [provider] Not Taking Active   Patient not taking:  Discontinued 11/24/19 1034 ISOtretinoin Micronized (ABSORICA LD) 24 MG CAPS [id number] No Take 1 tablet daily, with 1 Absorica LD 32 mg  Patient not taking: Reported on 04/29/2021   [name], [name], MD Not Taking Active   ISOtretinoin Micronized (ABSORICA LD) 32 MG CAPS [id number] No Take 1 tablet daily with 1 Absorica LD 24 mg tablet [name], [name], MD Taking Active   norgestimate-ethinyl estradiol (ORTHO-CYCLEN) 0.25-35 MG-MCG tablet [id number] No Take 1 tablet by mouth daily. [name], MD Taking Active   Pseudoephedrine-APAP-DM (DAYQUIL PO) [id number] No Take by mouth.  Patient not taking: Reported on 11/16/2020   [provider] Not Taking Active   spironolactone (ALDACTONE) 100 MG tablet [id number] No Take 1 tablet at bedtime [name], [name], MD Taking Active   valACYclovir (VALTREX) 1000 MG tablet [id number] No Take 1 tablet (1,000 mg total) by mouth daily. 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   Hidradenitis 03/18/2020    Conditions to be addressed/monitored per PCP order:   healthcare management needs, acne, hidradenitis  Care Plan : General Plan of Care (Adult)  Updates made by [name], RN since 05/30/2021 12:00 AM     Problem: Health Promotion or Disease Self-Management (General Plan  of Care)   Priority: Low  Onset Date: 11/16/2020     Long-Range Goal: Self-Management Plan Developed   Start Date: 11/16/2020  Expected End Date: 08/28/2021  Recent Progress: On track  Priority: Low  Note:    Current Barriers:  Knowledge Deficits related to short term plan for care coordination needs and long term plans for chronic disease management needs.   05/30/21:  Patient has received eye provider resources, no needs today. Nurse Case Manager Clinical Goal(s):  patient will work with care management team to address care coordination and chronic disease management needs related to Disease Management Educational Needs Care Coordination Medication Management and Education Medication Reconciliation Medication Assistance  Psychosocial Support   Interventions:  Evaluation of current treatment plan and patient's adherence to plan as established by provider. Reviewed medications with patient. Discussed plans with patient for ongoing care management follow up and provided patient with direct contact information for care management team Reviewed scheduled/upcoming provider appointments. Collaborated with SW for dental and eye resources BSW referral for dental and eye providers-completed BSW referral for housing resources-completed. BSW contacted patient regarding housing resources. Patient states she is staying with her aunt but is still responsible for 400 in rent, and food. Patient states she would prefer to live in [place] due to her mom being there. Patient makes about 3000/monthly gross. Patient would like a 1bedroom in the 850 range. BSW will research and put a list of resources together for patient. No other resources needed at this time. 01/31/21: BSW completed research and located resources for patient. BSW contacted patient to provide resources. Patient stated she found and completed an application today for [employer] in [place]. Patient stated no other resources or services are needed at this time. Update 03/03/21: Patient [place]09[place]-has roommates. 03/31/21: BSW contacted patient regarding finding an eye doctor and dentist. Patient asked for resources to be emailed to her at [email or web address]. Patient stated she has a dentist she just cannot remember the name but it is on [address] In  [place]. BSW will research and provide patient with the information. Self Care Activities:  Patient will self administer medications as prescribed Patient will attend all scheduled provider appointments Patient will call pharmacy for medication refills Patient will continue to perform ADL's independently Patient will call provider office for new concerns or questions Patient Goals: In the next 90 days, patient will attend all scheduled appointments. - Follow Up Plan: RNCM will follow up with patient within 30 days.  Patient has been provided with contact information.   Evidence-based guidance:   Review biopsychosocial determinants of health screens.  Review need for preventive screening based on age, sex, family history and health history.  Determine level of modifiable health risk.  Discuss identified risks.  Identify areas where behavior change may lead to improved health.  Promote healthy lifestyle.  Evoke change talk using open-ended questions, pros and cons, as well as looking forward.  Identify and manage conditions or preconditions to reduce health risk.  Implement additional goals and interventions based on identified risk factors.     Follow Up:  Patient agrees to Care Plan and Follow-up.  Plan: The Managed Medicaid care management team will reach out to the patient again over the next 30 days. and The  Patient has been provided with contact information for the Managed Medicaid care management team and has been advised to call with any health related questions or concerns.  Date/time of next scheduled RN care management/care  coordination outreach:  06/30/21 at 1230.

## 2021-06-01 ENCOUNTER — Ambulatory Visit: Payer: Medicaid Other | Admitting: Dermatology

## 2021-06-08 ENCOUNTER — Other Ambulatory Visit: Payer: Self-pay

## 2021-06-08 ENCOUNTER — Ambulatory Visit (HOSPITAL_COMMUNITY)
Admission: EM | Admit: 2021-06-08 | Discharge: 2021-06-08 | Disposition: A | Discharge status: Home / Self Care | Payer: Medicaid Other | Attending: Physician Assistant | Admitting: Physician Assistant

## 2021-06-08 ENCOUNTER — Encounter (HOSPITAL_COMMUNITY): Payer: Self-pay

## 2021-06-08 DIAGNOSIS — R52 Pain, unspecified: Secondary | ICD-10-CM | POA: Diagnosis not present

## 2021-06-08 DIAGNOSIS — Z20822 Contact with and (suspected) exposure to covid-19: Secondary | ICD-10-CM | POA: Diagnosis not present

## 2021-06-08 DIAGNOSIS — Z8616 Personal history of COVID-19: Secondary | ICD-10-CM | POA: Diagnosis not present

## 2021-06-08 DIAGNOSIS — J029 Acute pharyngitis, unspecified: Secondary | ICD-10-CM

## 2021-06-08 DIAGNOSIS — R051 Acute cough: Secondary | ICD-10-CM | POA: Insufficient documentation

## 2021-06-08 DIAGNOSIS — J069 Acute upper respiratory infection, unspecified: Secondary | ICD-10-CM

## 2021-06-08 LAB — POC INFLUENZA A AND B ANTIGEN (URGENT CARE ONLY)
INFLUENZA A ANTIGEN, POC: NEGATIVE
INFLUENZA B ANTIGEN, POC: NEGATIVE

## 2021-06-08 LAB — SARS CORONAVIRUS 2 (TAT 6-24 HRS): SARS Coronavirus 2: NEGATIVE

## 2021-06-08 MED ORDER — PREDNISONE 20 MG PO TABS: 40.0000 mg | ORAL_TABLET | Freq: Every day | ORAL | 0 refills | Status: AC

## 2021-06-08 MED ORDER — PROMETHAZINE-DM 6.25-15 MG/5ML PO SYRP: 5.0000 mL | ORAL_SOLUTION | Freq: Three times a day (TID) | ORAL | 0 refills | Status: DC | PRN

## 2021-06-08 NOTE — Discharge Instructions (Signed)
Your flu testing is negative.  We will contact you if your COVID is positive.  Please remain in isolation until you receive your results.  Start prednisone 40 mg for 5 days.  Do not take NSAIDs including aspirin, ibuprofen/Advil, naproxen/Aleve with this medication as it can cause stomach bleeding.  You can use Tylenol, Mucinex, Flonase for symptom relief.  Use Promethazine DM for cough.  Take this up to 3 times a day as needed for cough but do not drive or drink alcohol while taking this as drowsiness is a common side effect.  Make sure you rest and drink plenty of fluid.  If you have any worsening symptoms including high fever not responding to medication, chest pain, shortness of breath, nausea/vomiting interfering with oral intake you should go to the emergency room.  If your symptoms do not improve by early next week please return here or see your PCP.

## 2021-06-08 NOTE — ED Triage Notes (Signed)
Patient presents to Urgent Care with complaints of night sweats and body aches since Monday. Treating symptoms with Nyquil.   Denies fever.

## 2021-06-08 NOTE — ED Provider Notes (Signed)
[place]    CSN: [id number] Arrival date & time: 06/08/21  0907      History   Chief Complaint Chief Complaint  Patient presents with   Generalized Body Aches   Night Sweats    HPI Sharon Steele is a 21 y.o. female.   Patient presents today with a several day history of URI symptoms.  Reports chills, night sweats, congestion, body aches, mild cough, diarrhea.  Denies any chest pain, shortness of breath, nausea, vomiting, fever.  Reports her roommate has been sick but she is not now which she was diagnosed with.  She has tried NyQuil without improvement of symptoms.  Denies any recent antibiotic use.  She is confident she is not pregnant.  She does not smoke and denies history of asthma or allergies.  She has had COVID-19 vaccine.  Had negative COVID and flu testing at [facility] yesterday.  She has not had a flu shot.  She has had COVID approximately 1.5 years ago.   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 No pertinent surgical history.  OB History     Gravida  0   Para  0   Term  0   Preterm  0   AB  0   Living  0      SAB  0   IAB  0   Ectopic  0   Multiple  0   Live Births  0            Home Medications    Prior to Admission medications   Medication Sig Start Date End Date Taking? Authorizing Provider  predniSONE (DELTASONE) 20 MG tablet Take 2 tablets (40 mg total) by mouth daily for 5 days. 06/08/21 06/13/21 Yes Vernis Cabacungan K, PA-C  promethazine-dextromethorphan (PROMETHAZINE-DM) 6.25-15 MG/5ML syrup Take 5 mLs by mouth 3 (three) times daily as needed for cough. 06/08/21  Yes Cheryl Chay K, PA-C  clindamycin (CLEOCIN T) 1 % external solution Apply topically 2  (two) times daily. 03/17/21 03/17/22  [name], [place], MD  ISOtretinoin Micronized (ABSORICA LD) 32 MG CAPS Take 1 tablet daily with 1 Absorica LD 24 mg tablet 04/26/21   [name], [place], MD  norgestimate-ethinyl estradiol (ORTHO-CYCLEN) 0.25-35 MG-MCG tablet Take 1 tablet by mouth daily. 06/22/20   Donnamae Jude, MD  spironolactone (ALDACTONE) 100 MG tablet Take 1 tablet at bedtime 04/26/21   [name], [place], MD  valACYclovir (VALTREX) 1000 MG tablet Take 1 tablet (1,000 mg total) by mouth daily. 03/18/20   Donnamae Jude, MD  cetirizine (ZYRTEC) 10 MG tablet Take 1 tablet (10 mg total) by mouth daily. For 3 days 09/06/16 11/24/19  Harlene Salts, MD  ipratropium (ATROVENT) 0.06 % nasal spray Place 2 sprays into both nostrils 4 (four) times daily. Patient not taking: Reported on 09/06/2016 07/04/16 11/24/19  Lysbeth Penner, FNP    Family History Family History  Problem Relation Age of Onset   Cancer Mother    Hypertension Father    Hypertension Paternal Grandfather    Hypertension Paternal Grandmother    Diabetes Maternal Grandmother    Diabetes Maternal Grandfather    Hypertension Paternal Aunt    Multiple sclerosis Paternal Aunt  Social History Social History   Tobacco Use   Smoking status: Never   Smokeless tobacco: Never  Vaping Use   Vaping Use: Never used  Substance Use Topics   Alcohol use: Yes   Drug use: Not Currently     Allergies   Cashew nut oil and Almond oil   Review of Systems Review of Systems  Constitutional:  Positive for activity change, appetite change, chills and fatigue. Negative for fever.  HENT:  Positive for congestion and sore throat. Negative for sinus pressure and sneezing.   Respiratory:  Positive for cough. Negative for shortness of breath.   Cardiovascular:  Negative for chest pain.  Gastrointestinal:  Positive for diarrhea. Negative for abdominal pain, nausea and vomiting.  Musculoskeletal:  Positive for arthralgias and myalgias.  Neurological:   Positive for headaches. Negative for dizziness and light-headedness.    Physical Exam Triage Vital Signs ED Triage Vitals  Enc Vitals Group     BP 06/08/21 1012 127/88     Pulse Rate 06/08/21 1012 83     Resp 06/08/21 1012 16     Temp 06/08/21 1012 98.2 F (36.8 C)     Temp Source 06/08/21 1012 Oral     SpO2 06/08/21 1012 98 %     Weight --      Height --      Head Circumference --      Peak Flow --      Pain Score 06/08/21 1010 6     Pain Loc --      Pain Edu? --      Excl. in [place]? --    No data found.  Updated Vital Signs BP 127/88 (BP Location: Right Arm)    Pulse 83    Temp 98.2 F (36.8 C) (Oral)    Resp 16    SpO2 98%   Visual Acuity Right Eye Distance:   Left Eye Distance:   Bilateral Distance:    Right Eye Near:   Left Eye Near:    Bilateral Near:     Physical Exam Vitals reviewed.  Constitutional:      General: She is awake. She is not in acute distress.    Appearance: Normal appearance. She is well-developed. She is not ill-appearing.     Comments: Very pleasant female appears stated age in no acute distress sitting comfortably in exam room  HENT:     Head: Normocephalic and atraumatic.     Right Ear: Tympanic membrane, ear canal and external ear normal. Tympanic membrane is not erythematous or bulging.     Left Ear: Tympanic membrane, ear canal and external ear normal. Tympanic membrane is not erythematous or bulging.     Nose:     Right Sinus: No maxillary sinus tenderness or frontal sinus tenderness.     Left Sinus: No maxillary sinus tenderness or frontal sinus tenderness.     Mouth/Throat:     Pharynx: Uvula midline. Posterior oropharyngeal erythema present. No oropharyngeal exudate.  Cardiovascular:     Rate and Rhythm: Normal rate and regular rhythm.     Heart sounds: Normal heart sounds, S1 normal and S2 normal. No murmur heard. Pulmonary:     Effort: Pulmonary effort is normal.     Breath sounds: Normal breath sounds. No wheezing, rhonchi or  rales.     Comments: Clear to auscultation bilaterally Psychiatric:        Behavior: Behavior is cooperative.     UC Treatments / Results  Labs (all  labs ordered are listed, but only abnormal results are displayed) Labs Reviewed  SARS CORONAVIRUS 2 (TAT 6-24 HRS)  POC INFLUENZA A AND B ANTIGEN (URGENT CARE ONLY)    EKG   Radiology No results found.  Procedures Procedures (including critical care time)  Medications Ordered in UC Medications - No data to display  Initial Impression / Assessment and Plan / UC Course  I have reviewed the triage vital signs and the nursing notes.  Pertinent labs & imaging results that were available during my care of the patient were reviewed by me and considered in my medical decision making (see chart for details).     Vital signs and physical exam reassuring today; no indication for emergent evaluation or imaging.  Discussed likely viral etiology given short duration of symptoms.  No evidence of acute infection on physical exam that would warrant initiation of antibiotics.  Flu testing was negative.  COVID test is pending.  She is not a candidate for antivirals given young age and no significant past medical history.  Patient was provided work excuse note with current CDC return to work guidelines based on COVID testing results.  She was given Promethazine DM for cough with instruction not to drive or drink alcohol while taking this due to associated drowsiness.  She was given prednisone to help manage symptoms with instruction not to take NSAIDs with this medication due to risk of GI bleeding.  Can use Mucinex, Flonase, Tylenol for symptom relief.  She is to rest and drink plenty of fluid.  Discussed that her symptoms are improving within a week she should return here or see her PCP for reevaluation.  Discussed alarm symptoms that warrant emergent evaluation.  Strict return precautions given to which she expressed understanding.  Final Clinical  Impressions(s) / UC Diagnoses   Final diagnoses:  Upper respiratory tract infection, unspecified type  Body aches  Sore throat  Acute cough     Discharge Instructions      Your flu testing is negative.  We will contact you if your COVID is positive.  Please remain in isolation until you receive your results.  Start prednisone 40 mg for 5 days.  Do not take NSAIDs including aspirin, ibuprofen/Advil, naproxen/Aleve with this medication as it can cause stomach bleeding.  You can use Tylenol, Mucinex, Flonase for symptom relief.  Use Promethazine DM for cough.  Take this up to 3 times a day as needed for cough but do not drive or drink alcohol while taking this as drowsiness is a common side effect.  Make sure you rest and drink plenty of fluid.  If you have any worsening symptoms including high fever not responding to medication, chest pain, shortness of breath, nausea/vomiting interfering with oral intake you should go to the emergency room.  If your symptoms do not improve by early next week please return here or see your PCP.     ED Prescriptions     Medication Sig Dispense Auth. Provider   predniSONE (DELTASONE) 20 MG tablet Take 2 tablets (40 mg total) by mouth daily for 5 days. 10 tablet Larin Depaoli K, PA-C   promethazine-dextromethorphan (PROMETHAZINE-DM) 6.25-15 MG/5ML syrup Take 5 mLs by mouth 3 (three) times daily as needed for cough. 118 mL Zoella Roberti K, PA-C      PDMP not reviewed this encounter.   Terrilee Croak, PA-C 06/08/21 1128

## 2021-06-14 ENCOUNTER — Other Ambulatory Visit: Payer: Self-pay

## 2021-06-14 ENCOUNTER — Ambulatory Visit (INDEPENDENT_AMBULATORY_CARE_PROVIDER_SITE_OTHER): Payer: Medicaid Other | Admitting: Dermatology

## 2021-06-14 VITALS — Wt 151.0 lb

## 2021-06-14 DIAGNOSIS — L853 Xerosis cutis: Secondary | ICD-10-CM | POA: Diagnosis not present

## 2021-06-14 DIAGNOSIS — K130 Diseases of lips: Secondary | ICD-10-CM | POA: Diagnosis not present

## 2021-06-14 DIAGNOSIS — L700 Acne vulgaris: Secondary | ICD-10-CM

## 2021-06-14 DIAGNOSIS — Z79899 Other long term (current) drug therapy: Secondary | ICD-10-CM

## 2021-06-14 DIAGNOSIS — L732 Hidradenitis suppurativa: Secondary | ICD-10-CM

## 2021-06-14 MED ORDER — ABSORICA LD 24 MG PO CAPS: 1.0000 | ORAL_CAPSULE | Freq: Every day | ORAL | 0 refills | Status: DC

## 2021-06-14 MED ORDER — ABSORICA LD 32 MG PO CAPS: 1.0000 | ORAL_CAPSULE | Freq: Every day | ORAL | 0 refills | Status: DC

## 2021-06-14 NOTE — Patient Instructions (Signed)
While taking Isotretinoin and for 30 days after you finish the medication, do not get pregnant, do not share pills, do not donate blood.  Generic isotretinoin is best absorbed when taken with a fatty meal. Isotretinoin can make you sensitive to the sun. Daily careful sun protection including sunscreen SPF 30+ when outdoors is recommended. ° °Spironolactone can cause increased urination and cause blood pressure to decrease. Please watch for signs of lightheadedness and be cautious when changing position. It can sometimes cause breast tenderness or an irregular period in premenopausal women. It can also increase potassium. The increase in potassium usually is not a concern unless you are taking other medicines that also increase potassium, so please be sure your doctor knows all of the other medications you are taking. This medication should not be taken by pregnant women.  This medicine should also not be taken together with sulfa drugs like Bactrim (trimethoprim/sulfamethexazole).  ° °If You Need Anything After Your Visit ° °If you have any questions or concerns for your doctor, please call our main line at [phone] and press option 4 to reach your doctor's medical assistant. If no one answers, please leave a voicemail as directed and we will return your call as soon as possible. Messages left after 4 pm will be answered the following business day.  ° °You may also send us a message via MyChart. We typically respond to MyChart messages within 1-2 business days. ° °For prescription refills, please ask your pharmacy to contact our office. Our fax number is [phone]. ° °If you have an urgent issue when the clinic is closed that cannot wait until the next business day, you can page your doctor at the number below.   ° °Please note that while we do our best to be available for urgent issues outside of office hours, we are not available 24/7.  ° °If you have an urgent issue and are unable to reach us, you may  choose to seek medical care at your doctor's office, retail clinic, urgent care center, or emergency room. ° °If you have a medical emergency, please immediately call 911 or go to the emergency department. ° °Pager Numbers ° °- Dr. Kowalski: [phone] ° °- Dr. [name]: [phone] ° °- Dr. Stewart: [phone] ° °In the event of inclement weather, please call our main line at [phone] for an update on the status of any delays or closures. ° °Dermatology Medication Tips: °Please keep the boxes that topical medications come in in order to help keep track of the instructions about where and how to use these. Pharmacies typically print the medication instructions only on the boxes and not directly on the medication tubes.  ° °If your medication is too expensive, please contact our office at [phone] option 4 or send us a message through MyChart.  ° °We are unable to tell what your co-pay for medications will be in advance as this is different depending on your insurance coverage. However, we may be able to find a substitute medication at lower cost or fill out paperwork to get insurance to cover a needed medication.  ° °If a prior authorization is required to get your medication covered by your insurance company, please allow us 1-2 business days to complete this process. ° °Drug prices often vary depending on where the prescription is filled and some pharmacies may offer cheaper prices. ° °The website www.goodrx.com contains coupons for medications through different pharmacies. The prices here do not account for what the cost may be   with help from insurance (it may be cheaper with your insurance), but the website can give you the price if you did not use any insurance.  °- You can print the associated coupon and take it with your prescription to the pharmacy.  °- You may also stop by our office during regular business hours and pick up a GoodRx coupon card.  °- If you need your prescription sent  electronically to a different pharmacy, notify our office through [facility] MyChart or by phone at [phone] option 4. ° ° ° ° °Si Usted Necesita Algo Después de Su Visita ° °También puede enviarnos un mensaje a través de MyChart. Por lo general respondemos a los mensajes de MyChart en el transcurso de 1 a 2 días hábiles. ° °Para renovar recetas, por favor pida a su farmacia que se ponga en contacto con nuestra oficina. Nuestro número de fax es el [phone]. ° °Si tiene un asunto urgente cuando la clínica esté cerrada y que no puede esperar hasta el siguiente día hábil, puede llamar/localizar a su doctor(a) al número que aparece a continuación.  ° °Por favor, tenga en cuenta que aunque hacemos todo lo posible para estar disponibles para asuntos urgentes fuera del horario de oficina, no estamos disponibles las 24 horas del día, los 7 días de la semana.  ° °Si tiene un problema urgente y no puede comunicarse con nosotros, puede optar por buscar atención médica  en el consultorio de su doctor(a), en una clínica privada, en un centro de atención urgente o en una sala de emergencias. ° °Si tiene una emergencia médica, por favor llame inmediatamente al 911 o vaya a la sala de emergencias. ° °Números de bíper ° °- Dr. Kowalski: [phone] ° °- Dra. [name]: [phone] ° °- Dra. Stewart: [phone] ° °En caso de inclemencias del tiempo, por favor llame a nuestra línea principal al [phone] para una actualización sobre el estado de cualquier retraso o cierre. ° °Consejos para la medicación en dermatología: °Por favor, guarde las cajas en las que vienen los medicamentos de uso tópico para ayudarle a seguir las instrucciones sobre dónde y cómo usarlos. Las farmacias generalmente imprimen las instrucciones del medicamento sólo en las cajas y no directamente en los tubos del medicamento.  ° °Si su medicamento es muy caro, por favor, póngase en contacto con nuestra oficina llamando al [phone] y presione la  opción 4 o envíenos un mensaje a través de MyChart.  ° °No podemos decirle cuál será su copago por los medicamentos por adelantado ya que esto es diferente dependiendo de la cobertura de su seguro. Sin embargo, es posible que podamos encontrar un medicamento sustituto a menor costo o llenar un formulario para que el seguro cubra el medicamento que se considera necesario.  ° °Si se requiere una autorización previa para que su compañía de seguros cubra su medicamento, por favor permítanos de 1 a 2 días hábiles para completar este proceso. ° °Los precios de los medicamentos varían con frecuencia dependiendo del lugar de dónde se surte la receta y alguna farmacias pueden ofrecer precios más baratos. ° °El sitio web www.goodrx.com tiene cupones para medicamentos de diferentes farmacias. Los precios aquí no tienen en cuenta lo que podría costar con la ayuda del seguro (puede ser más barato con su seguro), pero el sitio web puede darle el precio si no utilizó ningún seguro.  °- Puede imprimir el cupón correspondiente y llevarlo con su receta a la farmacia.  °- También puede pasar por nuestra oficina durante   el horario de atención regular y recoger una tarjeta de cupones de GoodRx.  °- Si necesita que su receta se envíe electrónicamente a una farmacia diferente, informe a nuestra oficina a través de MyChart de [facility] o por teléfono llamando al [phone] y presione la opción 4. ° °

## 2021-06-14 NOTE — Progress Notes (Signed)
Isotretinoin Follow-Up Visit   Subjective  Sharon Steele is a 21 y.o. female who presents for the following: Follow-up (Patient here today for acne and HS follow up. Patient on isotretinoin but did not pick up last months rx so she is still at Week #8. Patient also taking spironolactone 100 mg daily. Patient advises she is currently flared under breasts. She ).  Week # 8 Oral [facility] pills/condoms [facility] pharmacy iPledge [phone]  See flowsheet for details  Side effects: Dry skin, dry lips  Denies changes in night vision, shortness of breath, abdominal pain, nausea, vomiting, diarrhea, blood in stool or urine, visual changes, headaches, epistaxis, joint pain, myalgias, mood changes, depression, or suicidal ideation.   Patient is not pregnant, not seeking pregnancy, and not breastfeeding.   The following portions of the chart were reviewed this encounter and updated as appropriate: medications, allergies, medical history  Review of Systems:  No other skin or systemic complaints except as noted in HPI or Assessment and Plan.  Objective  Well appearing patient in no apparent distress; mood and affect are within normal limits.  An examination of the face, neck, chest, and back was performed and relevant findings are noted below.   Inframammary Multiple inflamed papules and cysts, some draining at inframammary   face Small cyst, rare open comedone at face Chest and back with trace open comedones, scarring    Assessment & Plan   Hidradenitis suppurativa Inframammary  Chronic condition with duration or expected duration over one year. Condition is bothersome to patient. Currently flared particularly under breasts.  Pt defers Humira  Increase spironolactone to 100 mg qam and 50 mg qam pending normal potassium. Will recheck BP in 1 and 1/2 weeks and if doing well, plan to increase to spironolactone 100 mg twice a day with potassium recheck in 1 month.   BP today  135/96  Increase to Absorica LD 24 mg once a day and Absorica LD 32 mg once a day (equivalent to 70 mg daily of regular isotretinoin)   Continue PanOxyl and clindamycin daily to affected areas.   Urine pregnancy test performed in office today and was negative.  Patient demonstrates comprehension and confirms she will not get pregnant.   Patient confirmed in iPledge and isotretinoin sent to pharmacy.     ISOtretinoin Micronized (ABSORICA LD) 24 MG CAPS - Inframammary Take 1 capsule by mouth daily.  ISOtretinoin Micronized (ABSORICA LD) 32 MG CAPS - Inframammary Take 1 capsule by mouth daily.  Related Procedures Potassium  Related Medications spironolactone (ALDACTONE) 100 MG tablet Take 1 tablet at bedtime  Acne vulgaris face  Severe and Chronic (present >1 year); currently on Isotretinoin and not to goal (must reach target dose based on weight and also have clear skin for 2 months prior to discontinuation in order to help prevent relapse)  Increase to Absorica LD 24 mg once a day and Absorica LD 32 mg once a day (equivalent to 70 mg daily of regular isotretinoin)  Urine pregnancy test performed in office today and was negative.  Patient demonstrates comprehension and confirms she will not get pregnant.   Patient confirmed in iPledge and isotretinoin sent to pharmacy.  Also increasing spironolactone for HS as above    Related Procedures Potassium    Xerosis secondary to isotretinoin therapy - Continue emollients as directed  Cheilitis secondary to isotretinoin therapy - Continue lip balm as directed, Dr. Clayborne Artist Cortibalm recommended  Long term medication management (isotretinoin) - While taking Isotretinoin and for  30 days after you finish the medication, do not get pregnant, do not share pills, do not donate blood. Isotretinoin is best absorbed when taken with a fatty meal. Isotretinoin can make you sensitive to the sun. Daily careful sun protection including  sunscreen SPF 30+ when outdoors is recommended.  Follow-up in 30 days.  Sharon Steele, RMA, am acting as scribe for Darden Dates, MD .  Documentation: I have reviewed the above documentation for accuracy and completeness, and I agree with the above.  Darden Dates, MD

## 2021-06-15 ENCOUNTER — Telehealth: Payer: Self-pay

## 2021-06-15 LAB — POTASSIUM: Potassium: 3.9 mmol/L (ref 3.5–5.2)

## 2021-06-15 NOTE — Telephone Encounter (Signed)
Patient advised of lab work. She will be coming in next Thursday for blood pressure check. aw

## 2021-06-15 NOTE — Telephone Encounter (Signed)
-----   Message from [place], MD sent at 06/15/2021  8:07 AM EST ----- Potassium normal.  Please have patient take 1 tablet spironolactone 100 mg in evening and 1/2 tablet in morning (or vice versa), then do her BP check in about 1 week. Thank you!

## 2021-06-17 ENCOUNTER — Encounter: Payer: Self-pay | Admitting: Dermatology

## 2021-06-22 DIAGNOSIS — Z419 Encounter for procedure for purposes other than remedying health state, unspecified: Secondary | ICD-10-CM | POA: Diagnosis not present

## 2021-06-23 ENCOUNTER — Ambulatory Visit: Payer: Medicaid Other

## 2021-06-25 ENCOUNTER — Encounter: Payer: Self-pay | Admitting: Radiology

## 2021-06-29 ENCOUNTER — Ambulatory Visit (INDEPENDENT_AMBULATORY_CARE_PROVIDER_SITE_OTHER): Payer: Medicaid Other | Admitting: *Deleted

## 2021-06-29 ENCOUNTER — Other Ambulatory Visit: Payer: Self-pay

## 2021-06-29 VITALS — BP 125/83 | HR 69

## 2021-06-29 DIAGNOSIS — Z113 Encounter for screening for infections with a predominantly sexual mode of transmission: Secondary | ICD-10-CM | POA: Diagnosis not present

## 2021-06-29 NOTE — Progress Notes (Addendum)
SUBJECTIVE:  21 y.o. female who desires a blood STI screen. Denies history of known exposure to STD.  No LMP recorded. (Menstrual status: Oral contraceptives).  OBJECTIVE:  She appears well.   ASSESSMENT:  STI Screen   PLAN: STI blood test drawn. Will decide if she wants to come back for a self swab for other STI screen.    Pt follow up as needed.    Scheryl Marten, RN   Patient was assessed and managed by nursing staff during this encounter. I have reviewed the chart and agree with the documentation and plan. I have also made any necessary editorial changes.  Jaynie Collins, MD 06/29/2021 2:43 PM

## 2021-06-30 ENCOUNTER — Encounter: Payer: Self-pay | Admitting: Dermatology

## 2021-06-30 ENCOUNTER — Other Ambulatory Visit: Payer: Self-pay | Admitting: Obstetrics and Gynecology

## 2021-06-30 LAB — HEPATITIS B SURFACE ANTIGEN: Hepatitis B Surface Ag: NEGATIVE

## 2021-06-30 LAB — RPR: RPR Ser Ql: NONREACTIVE

## 2021-06-30 LAB — HEPATITIS C ANTIBODY: Hep C Virus Ab: <0.1 s/co ratio (ref 0.0–0.9)

## 2021-06-30 LAB — HIV ANTIBODY (ROUTINE TESTING W REFLEX): HIV Screen 4th Generation wRfx: NONREACTIVE

## 2021-06-30 NOTE — Patient Instructions (Signed)
Hi Sharon Steele-nice to speak with you today-have a great afternoon!  Sharon Steele was given information about Medicaid Managed Care team care coordination services as a part of their [facility] Medicaid benefit. Sharon Steele All verbally consented to engagement with the [facility] Managed Care team.   If you are experiencing a medical emergency, please call 911 or report to your local emergency department or urgent care.   If you have a non-emergency medical problem during routine business hours, please contact your provider's office and ask to speak with a nurse.   For questions related to your [facility] health plan, please call: [phone] or go here:[email or web address]  If you would like to schedule transportation through your [facility] plan, please call the following number at least 2 days in advance of your appointment: [phone].  Call the [facility] Crisis Line at [phone], at any time, 24 hours a day, 7 days a week. If you are in danger or need immediate medical attention call 911.  If you would like help to quit smoking, call 1-800-QUIT-NOW (810-789-0022) OR Espaol: 1-855-Djelo-Ya (2-683-419-6222) o para ms informacin haga clic aqu or Text READY to 979-892 to register via text  Sharon Steele - following are the goals we discussed in your visit today:   Goals Addressed             This Visit's Progress    Protect My Health       Timeframe:  Long-Range Goal Priority:  Low Start Date:    11/16/20                         Expected End Date:    ongoing                   Follow Up Date: 07/28/21   - practice safe sex - schedule appointment for flu shot - schedule appointment for vaccines needed due to my age or health - schedule recommended health tests (blood work, mammogram, colonoscopy, pap test) - schedule and keep appointment for annual check-up    Why is this important?   Screening tests can find diseases early when they are easier to  treat.  Your doctor or nurse will talk with you about which tests are important for you.  Getting shots for common diseases like the flu and shingles will help prevent them.     06/30/21:  patient has scheduled an appointment with an eye provider-thinks it is [facility].   Patient verbalizes understanding of instructions and care plan provided today and agrees to view in MyChart. Active MyChart status confirmed with patient.    The Managed Medicaid care management team will reach out to the patient again over the next 30 days.  The  Patient has been provided with contact information for the Managed Medicaid care management team and has been advised to call with any health related questions or concerns.   Sharon Der RN, BSN [place]   [facility] [employer] - Managed Medicaid High Risk [phone].   Following is a copy of your plan of care:  Care Plan : General Plan of Care (Adult)  Updates made by [name], RN since 06/30/2021 12:00 AM     Problem: Health Promotion or Disease Self-Management (General Plan of Care)   Priority: Low  Onset Date: 11/16/2020     Long-Range Goal: Self-Management Plan Developed   Start Date: 11/16/2020  Expected End Date: 08/28/2021  Recent Progress: On track  Priority: Low  Note:    Current Barriers:  Knowledge Deficits related to short term plan for care coordination needs and long term plans for chronic disease management needs.  06/30/21:  Patient without complaint today-has scheduled an appointment with an eye provider-thinks it is [facility] Nurse Case Manager Clinical Goal(s):  patient will work with care management team to address care coordination and chronic disease management needs related to Disease Management Educational Needs Care Coordination Medication Management and Education Medication Reconciliation Medication Assistance  Psychosocial Support   Interventions:  Evaluation of  current treatment plan and patient's adherence to plan as established by provider. Reviewed medications with patient. Discussed plans with patient for ongoing care management follow up and provided patient with direct contact information for care management team Reviewed scheduled/upcoming provider appointments. Collaborated with SW for dental and eye resources BSW referral for dental and eye providers-completed BSW referral for housing resources-completed. BSW contacted patient regarding housing resources. Patient states she is staying with her aunt but is still responsible for 400 in rent, and food. Patient states she would prefer to live in [place] due to her mom being there. Patient makes about 3000/monthly gross. Patient would like a 1bedroom in the 850 [place]. BSW will research and put a list of resources together for patient. No other resources needed at this time. 01/31/21: BSW completed research and located resources for patient. BSW contacted patient to provide resources. Patient stated she found and completed an application today for [employer] in [place]. Patient stated no other resources or services are needed at this time. Update 03/03/21: Patient states she moved to [place] 02/09/21-has roommates. 03/31/21: BSW contacted patient regarding finding an eye doctor and dentist. Patient asked for resources to be emailed to her at [email or web address]. Patient stated she has a dentist she just cannot remember the name but it is on [address]. In [place]. BSW will research and provide patient with the information. Self Care Activities:  Patient will self administer medications as prescribed Patient will attend all scheduled provider appointments Patient will call pharmacy for medication refills Patient will continue to perform ADL's independently Patient will call provider office for new concerns or questions Patient Goals: In the next 90 days, patient will attend  all scheduled appointments. - Follow Up Plan: RNCM will follow up with patient within 30 days.  Patient has been provided with contact information.

## 2021-06-30 NOTE — Telephone Encounter (Signed)
Please let patient know ok to increase spironolactone to 100 mg twice a day and send in prescription if she needs it. Thank you!

## 2021-06-30 NOTE — Patient Outreach (Signed)
Medicaid Managed Care   Nurse Care Manager Note  06/30/2021 Name:  Sharon Steele MRN:  [id number] DOB:  May 21, 2001  Sharon Steele is an 21 y.o. year old female who is a primary patient of [facility].  The [facility] Managed Care Coordination team was consulted for assistance with:    Healthcare management needs, resources, hidradenitis, acne  Sharon Steele was given information about Medicaid Managed Care Coordination team services today. Sharon Steele Patient agreed to services and verbal consent obtained.  Engaged with patient by telephone for follow up visit in response to provider referral for case management and/or care coordination services.   Assessments/Interventions:  Review of past medical history, allergies, medications, health status, including review of consultants reports, laboratory and other test data, was performed as part of comprehensive evaluation and provision of chronic care management services.  SDOH (Social Determinants of Health) assessments and interventions performed: SDOH Interventions    Flowsheet Row Most Recent Value  SDOH Interventions   Food Insecurity Interventions Intervention Not Indicated  Housing Interventions Intervention Not Indicated      Care Plan  Allergies  Allergen Reactions   Cashew Nut Oil Hives, Itching and Swelling   Almond Oil     Medications Reviewed Today     Reviewed by Sharon Chandler, RN (Registered Nurse) on 06/30/21 at 1236  Med List Status: <None>   Medication Order Taking? Sig Documenting Provider Last Dose Status Informant    Discontinued 11/24/19 1034   clindamycin (CLEOCIN T) 1 % external solution [id number]  Apply topically 2 (two) times daily. [name] [name], MD  Active    Patient not taking:   Discontinued 11/24/19 1034 ISOtretinoin Micronized (ABSORICA LD) 24 MG CAPS [id number]  Take 1 capsule by mouth daily. [name], [name], MD  Active   ISOtretinoin Micronized (ABSORICA LD) 32 MG CAPS  [id number] No Take 1 tablet daily with 1 Absorica LD 24 mg tablet  Patient not taking: Reported on 06/30/2021   [facility], [name], MD Not Taking Active   ISOtretinoin Micronized (ABSORICA LD) 32 MG CAPS [id number]  Take 1 capsule by mouth daily. [name], [name], MD  Active   norgestimate-ethinyl estradiol (ORTHO-CYCLEN) 0.25-35 MG-MCG tablet [id number]  Take 1 tablet by mouth daily. [name], MD  Active   promethazine-dextromethorphan (PROMETHAZINE-DM) 6.25-15 MG/5ML syrup [id number] No Take 5 mLs by mouth 3 (three) times daily as needed for cough.  Patient not taking: Reported on 06/30/2021   Jeani Hawking, PA-C Not Taking Active   spironolactone (ALDACTONE) 100 MG tablet [id number]  Take 1 tablet at bedtime [name], [name], MD  Active   valACYclovir (VALTREX) 1000 MG tablet [id number]  Take 1 tablet (1,000 mg total) by mouth daily. 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   Hidradenitis 03/18/2020   Conditions to be addressed/monitored per PCP order:  Healthcare management needs, resources, hidradenitis, acne  Care Plan : General Plan of Care (Adult)  Updates made by Sharon Chandler, RN since 06/30/2021 12:00 AM     Problem: Health Promotion or Disease Self-Management (General Plan of Care)   Priority: Low  Onset Date: 11/16/2020     Long-Range Goal: Self-Management Plan Developed   Start Date: 11/16/2020  Expected End Date: 08/28/2021  Recent  Progress: On track  Priority: Low  Note:    Current Barriers:  Knowledge Deficits related to short term plan for care coordination needs and long term plans for chronic disease management needs.  06/30/21:  Patient without complaint today-has scheduled an appointment with an eye provider-thinks it is [facility] Nurse Case Manager Clinical Goal(s):  patient will  work with care management team to address care coordination and chronic disease management needs related to Disease Management Educational Needs Care Coordination Medication Management and Education Medication Reconciliation Medication Assistance  Psychosocial Support   Interventions:  Evaluation of current treatment plan and patient's adherence to plan as established by provider. Reviewed medications with patient. Discussed plans with patient for ongoing care management follow up and provided patient with direct contact information for care management team Reviewed scheduled/upcoming provider appointments. Collaborated with SW for dental and eye resources BSW referral for dental and eye providers-completed BSW referral for housing resources-completed. BSW contacted patient regarding housing resources. Patient states she is staying with her aunt but is still responsible for 400 in rent, and food. Patient states she would prefer to live in [place] due to her mom being there. Patient makes about 3000/monthly gross. Patient would like a 1bedroom in the 850 range. BSW will research and put a list of resources together for patient. No other resources needed at this time. 01/31/21: BSW completed research and located resources for patient. BSW contacted patient to provide resources. Patient stated she found and completed an application today for [employer] in [place]. Patient stated no other resources or services are needed at this time. Update 03/03/21: Patient states she moved to [place] 02/09/21-has roommates. 03/31/21: BSW contacted patient regarding finding an eye doctor and dentist. Patient asked for resources to be emailed to her at [email or web address]. Patient stated she has a dentist she just cannot remember the name but it is on [address]. In [place]. BSW will research and provide patient with the information. Self Care Activities:  Patient will self administer  medications as prescribed Patient will attend all scheduled provider appointments Patient will call pharmacy for medication refills Patient will continue to perform ADL's independently Patient will call provider office for new concerns or questions Patient Goals: In the next 90 days, patient will attend all scheduled appointments. - Follow Up Plan: RNCM will follow up with patient within 30 days.  Patient has been provided with contact information.   Follow Up:  Patient agrees to Care Plan and Follow-up.  Plan: The Managed Medicaid care management team will reach out to the patient again over the next 30 days. and The  Patient has been provided with contact information for the Managed Medicaid care management team and has been advised to call with any health related questions or concerns.  Date/time of next scheduled RN care management/care coordination outreach:  07/28/21 at 1230.

## 2021-07-19 ENCOUNTER — Ambulatory Visit: Payer: Medicaid Other | Admitting: Dermatology

## 2021-07-20 DIAGNOSIS — Z419 Encounter for procedure for purposes other than remedying health state, unspecified: Secondary | ICD-10-CM | POA: Diagnosis not present

## 2021-07-27 ENCOUNTER — Other Ambulatory Visit: Payer: Self-pay | Admitting: Obstetrics and Gynecology

## 2021-07-27 ENCOUNTER — Other Ambulatory Visit: Payer: Self-pay

## 2021-07-27 NOTE — Patient Outreach (Signed)
Care Coordination ? ?07/27/2021 ? ?Sharon Steele ?December 14, 2000 ?[id number] ? ?RNCM returned patient's phone call regarding eye provider appointment. ? ?Kathi Der RN, BSN ?[place]  [facility] ?Care Management Coordinator - Managed Medicaid High Risk ?[phone] ?  ? ? ?

## 2021-07-28 ENCOUNTER — Other Ambulatory Visit: Payer: Self-pay | Admitting: Obstetrics and Gynecology

## 2021-07-28 DIAGNOSIS — H5213 Myopia, bilateral: Secondary | ICD-10-CM | POA: Diagnosis not present

## 2021-07-28 NOTE — Patient Outreach (Signed)
Care Coordination ? ?07/28/2021 ? ?Isaias Sakai L Jelinek ?Sep 15, 2000 ?[id number] ? ? ?Medicaid Managed Care  ? ?Unsuccessful Outreach Note ? ?07/28/2021 ?Name: Sharon Steele MRN: [id number] DOB: [birth year] ? ?Referred by: [facility] ?Reason for referral : High Risk Managed Medicaid (Unsuccessful telephone outreach) ? ? ?An unsuccessful telephone outreach was attempted today. The patient was referred to the case management team for assistance with care management and care coordination.  ? ?Follow Up Plan: The care management team will reach out to the patient again over the next 7-14 business days.  ? ?Kathi Der RN, BSN ?[place]  [facility] ?Care Management Coordinator - Managed Medicaid High Risk ?[phone] ?  ? ? ?

## 2021-07-28 NOTE — Patient Instructions (Signed)
Hey Ms. Mustard, I am sorry I didn't reach you today- as a part of your Medicaid benefit, you are eligible for care management and care coordination services at no cost or copay. I was unable to reach you by phone today but would be happy to help you with your health related needs. Please feel free to call me at [phone]. ? ?A member of the Managed Medicaid care management team will reach out to you again over the next 7 -14 business days.  ? ?Kathi Der RN, BSN ?[place]  [facility] ?Care Management Coordinator - Managed Medicaid High Risk ?[phone] ?  ?

## 2021-08-01 ENCOUNTER — Other Ambulatory Visit: Payer: Self-pay

## 2021-08-01 NOTE — Patient Outreach (Signed)
Care Coordination ? ?08/01/2021 ? ?Sharon Steele ?[birth year] ?[id number] ? ? ?Medicaid Managed Care  ? ?Unsuccessful Outreach Note ? ?08/01/2021 ?Name: Sharon Steele MRN: [id number] DOB: [birth year] ? ?Referred by: [facility] ?Reason for referral : High Risk Managed Medicaid (MM social work Soil scientist) ? ? ?An unsuccessful telephone outreach was attempted today. The patient was referred to the case management team for assistance with care management and care coordination.  ? ?Follow Up Plan: The patient has been provided with contact information for the care management team and has been advised to call with any health related questions or concerns.  ? ?Gus Puma, BSW, MHA ?[facility]  ?High Risk Managed Medicaid Team  ?[phone]  ?

## 2021-08-01 NOTE — Patient Instructions (Signed)
Visit Information ? ?Ms. Sharon Steele  - as a part of your Medicaid benefit, you are eligible for care management and care coordination services at no cost or copay. I was unable to reach you by phone today but would be happy to help you with your health related needs. Please feel free to call me @ [phone] ? ?.  ?Mickel Fuchs, BSW, [place] ?[place]  ?High Risk Managed Medicaid Team  ?[phone]  ?

## 2021-08-17 ENCOUNTER — Other Ambulatory Visit: Payer: Self-pay | Admitting: Obstetrics and Gynecology

## 2021-08-17 NOTE — Patient Instructions (Signed)
Hi Ms. Forry, I hope you are okay-, I'm sorry I have missed you today, as a part of your Medicaid benefit, you are eligible for care management and care coordination services at no cost or copay. I was unable to reach you by phone today but would be happy to help you with your health related needs. Please feel free to call me at [phone] ? ?A member of the Managed Medicaid care management team will reach out to you again over the next 30 business  days.  ? ?Kathi Der RN, BSN ?[place]  [facility] ?Care Management Coordinator - Managed Medicaid High Risk ?[phone] ?  ?

## 2021-08-17 NOTE — Patient Outreach (Signed)
Care Coordination ? ?08/17/2021 ? ?[name] ?[birth year] ?[id number] ? ? ?Medicaid Managed Care  ? ?Unsuccessful Outreach Note ? ?08/17/2021 ?Name: Sharon Steele MRN: [id number] DOB: 2001-01-07 ? ?Referred by: [facility] ?Reason for referral : High Risk Managed Medicaid (Unsuccessful telephone outreach) ? ? ?A second unsuccessful telephone outreach was attempted today. The patient was referred to the case management team for assistance with care management and care coordination.  ? ?Follow Up Plan: The care management team will reach out to the patient again over the next 30 business days.  ? ?Aida Raider RN, BSN ?[facility] ?Care Management Coordinator - Managed Medicaid High Risk ?[phone] ?  ? ? ?

## 2021-08-20 DIAGNOSIS — Z419 Encounter for procedure for purposes other than remedying health state, unspecified: Secondary | ICD-10-CM | POA: Diagnosis not present

## 2021-09-19 DIAGNOSIS — Z419 Encounter for procedure for purposes other than remedying health state, unspecified: Secondary | ICD-10-CM | POA: Diagnosis not present

## 2021-09-23 ENCOUNTER — Ambulatory Visit: Payer: Medicaid Other

## 2021-09-27 ENCOUNTER — Other Ambulatory Visit: Payer: Self-pay | Admitting: Obstetrics and Gynecology

## 2021-09-27 NOTE — Patient Instructions (Signed)
Visit Information ? ?Ms. Sharon Steele  - as a part of your Medicaid benefit, you are eligible for care management and care coordination services at no cost or copay. I was unable to reach you by phone today but would be happy to help you with your health related needs. Please feel free to call me at [phone] ? ?Kathi Der RN, BSN ?[place]  [facility] ?Care Management Coordinator - Managed Medicaid High Risk ?[phone] ?  ?

## 2021-09-27 NOTE — Patient Outreach (Signed)
Care Coordination ? ?09/27/2021 ? ?[name] ?[birth year] ?[id number] ? ? ?Medicaid Managed Care  ? ?Unsuccessful Outreach Note ? ?09/27/2021 ?Name: Sharon Steele MRN: [id number] DOB: 13-Oct-2000 ? ?Referred by: [facility] ?Reason for referral : High Risk Managed Medicaid (Unsuccessful telephone outreach) ? ? ?Third unsuccessful telephone outreach was attempted today. The patient was referred to the case management team for assistance with care management and care coordination. The patient's primary care provider has been notified of our unsuccessful attempts to make or maintain contact with the patient. The care management team is pleased to engage with this patient at any time in the future should he/she be interested in assistance from the care management team.  ? ?Follow Up Plan: We have been unable to make contact with the patient for follow up. The care management team is available to follow up with the patient after provider conversation with the patient regarding recommendation for care management engagement and subsequent re-referral to the care management team.  ? ?Kathi Der RN, BSN ?[place]  [facility] ?Care Management Coordinator - Managed Medicaid High Risk ?[phone] ?  ? ? ?

## 2021-10-12 ENCOUNTER — Ambulatory Visit: Payer: Medicaid Other | Admitting: Family Medicine

## 2021-10-12 ENCOUNTER — Encounter: Payer: Self-pay | Admitting: Family Medicine

## 2021-10-13 NOTE — Progress Notes (Signed)
Patient did not keep appointment today. She may call to reschedule.  

## 2021-10-20 DIAGNOSIS — Z419 Encounter for procedure for purposes other than remedying health state, unspecified: Secondary | ICD-10-CM | POA: Diagnosis not present

## 2021-10-26 ENCOUNTER — Ambulatory Visit: Payer: Medicaid Other | Admitting: Family Medicine

## 2021-11-19 DIAGNOSIS — Z419 Encounter for procedure for purposes other than remedying health state, unspecified: Secondary | ICD-10-CM | POA: Diagnosis not present

## 2021-12-20 DIAGNOSIS — Z419 Encounter for procedure for purposes other than remedying health state, unspecified: Secondary | ICD-10-CM | POA: Diagnosis not present

## 2022-01-18 ENCOUNTER — Other Ambulatory Visit (HOSPITAL_COMMUNITY)
Admission: RE | Admit: 2022-01-18 | Discharge: 2022-01-18 | Disposition: A | Discharge status: Home / Self Care | Payer: Medicaid Other | Source: Ambulatory Visit | Attending: Family Medicine | Admitting: Family Medicine

## 2022-01-18 ENCOUNTER — Ambulatory Visit (INDEPENDENT_AMBULATORY_CARE_PROVIDER_SITE_OTHER): Payer: Medicaid Other | Admitting: *Deleted

## 2022-01-18 DIAGNOSIS — N898 Other specified noninflammatory disorders of vagina: Secondary | ICD-10-CM | POA: Diagnosis not present

## 2022-01-18 DIAGNOSIS — R300 Dysuria: Secondary | ICD-10-CM

## 2022-01-18 LAB — POCT URINALYSIS DIPSTICK
Blood, UA: NEGATIVE
Nitrite, UA: POSITIVE

## 2022-01-18 MED ORDER — SULFAMETHOXAZOLE-TRIMETHOPRIM 800-160 MG PO TABS: 1.0000 | ORAL_TABLET | Freq: Two times a day (BID) | ORAL | 0 refills | Status: AC

## 2022-01-18 NOTE — Progress Notes (Cosign Needed)
SUBJECTIVE:  21 y.o. female complains of white vaginal discharge for few day(s) and dysuria Denies abnormal vaginal bleeding or significant pelvic pain or fever.Denies history of known exposure to STD.  No LMP recorded. (Menstrual status: Oral contraceptives).  OBJECTIVE:  She appears alert, well appearing, in no apparent distress Urine dipstick: positive for nitrates.  ASSESSMENT:  Vaginal Discharge  Vaginal Odor Dysuria    PLAN:  GC, chlamydia, trichomonas, BVAG, CVAG probe and urine culture sent to lab. Treatment: Bactrim DS x 3 days for UTI. To be determined once lab results are received ROV prn if symptoms persist or worsen.   Reminded to schedule repeat pap

## 2022-01-18 NOTE — Progress Notes (Signed)
Patient seen and assessed by nursing staff.  Agree with documentation and plan.  

## 2022-01-19 ENCOUNTER — Other Ambulatory Visit: Payer: Self-pay | Admitting: Family Medicine

## 2022-01-19 ENCOUNTER — Ambulatory Visit: Payer: Medicaid Other

## 2022-01-19 LAB — CERVICOVAGINAL ANCILLARY ONLY
Bacterial Vaginitis (gardnerella): POSITIVE — AB
Candida Glabrata: NEGATIVE
Candida Vaginitis: NEGATIVE
Chlamydia: NEGATIVE
Comment: NEGATIVE
Comment: NEGATIVE
Comment: NEGATIVE
Comment: NEGATIVE
Comment: NEGATIVE
Comment: NORMAL
Neisseria Gonorrhea: NEGATIVE
Trichomonas: NEGATIVE

## 2022-01-19 MED ORDER — METRONIDAZOLE 500 MG PO TABS: 500.0000 mg | ORAL_TABLET | Freq: Two times a day (BID) | ORAL | 0 refills | Status: DC

## 2022-01-19 NOTE — Progress Notes (Signed)
C/w BV--rx sent in

## 2022-01-20 DIAGNOSIS — Z419 Encounter for procedure for purposes other than remedying health state, unspecified: Secondary | ICD-10-CM | POA: Diagnosis not present

## 2022-01-20 LAB — URINE CULTURE

## 2022-01-25 ENCOUNTER — Other Ambulatory Visit: Payer: Self-pay | Admitting: Family Medicine

## 2022-01-25 DIAGNOSIS — A6004 Herpesviral vulvovaginitis: Secondary | ICD-10-CM

## 2022-02-14 DIAGNOSIS — L02214 Cutaneous abscess of groin: Secondary | ICD-10-CM | POA: Diagnosis not present

## 2022-02-14 DIAGNOSIS — L732 Hidradenitis suppurativa: Secondary | ICD-10-CM | POA: Diagnosis not present

## 2022-02-19 DIAGNOSIS — Z419 Encounter for procedure for purposes other than remedying health state, unspecified: Secondary | ICD-10-CM | POA: Diagnosis not present

## 2022-02-20 ENCOUNTER — Telehealth: Payer: Self-pay

## 2022-02-20 ENCOUNTER — Other Ambulatory Visit: Payer: Self-pay

## 2022-02-20 DIAGNOSIS — B3731 Acute candidiasis of vulva and vagina: Secondary | ICD-10-CM

## 2022-02-20 MED ORDER — FLUCONAZOLE 150 MG PO TABS: 150.0000 mg | ORAL_TABLET | Freq: Once | ORAL | 0 refills | Status: AC

## 2022-02-20 NOTE — Telephone Encounter (Signed)
Pt called stating she was seen at [place] over the weekend and prescribed doxycyline. Pt now complains of white vaginal discharge and itching. Pt requesting one dose of diflucan.  Rx sent to [facility] on [address]. Pt has no further questions at this time.

## 2022-02-28 ENCOUNTER — Other Ambulatory Visit (HOSPITAL_COMMUNITY)
Admission: RE | Admit: 2022-02-28 | Discharge: 2022-02-28 | Disposition: A | Discharge status: Home / Self Care | Payer: BC Managed Care – PPO | Source: Ambulatory Visit | Attending: Obstetrics and Gynecology | Admitting: Obstetrics and Gynecology

## 2022-02-28 ENCOUNTER — Encounter: Payer: Self-pay | Admitting: Obstetrics and Gynecology

## 2022-02-28 ENCOUNTER — Ambulatory Visit (INDEPENDENT_AMBULATORY_CARE_PROVIDER_SITE_OTHER): Payer: BC Managed Care – PPO | Admitting: Obstetrics and Gynecology

## 2022-02-28 VITALS — BP 134/83 | HR 61 | Wt 154.0 lb

## 2022-02-28 DIAGNOSIS — Z202 Contact with and (suspected) exposure to infections with a predominantly sexual mode of transmission: Secondary | ICD-10-CM | POA: Diagnosis not present

## 2022-02-28 DIAGNOSIS — Z3041 Encounter for surveillance of contraceptive pills: Secondary | ICD-10-CM | POA: Diagnosis not present

## 2022-02-28 DIAGNOSIS — R87612 Low grade squamous intraepithelial lesion on cytologic smear of cervix (LGSIL): Secondary | ICD-10-CM | POA: Insufficient documentation

## 2022-02-28 DIAGNOSIS — A6004 Herpesviral vulvovaginitis: Secondary | ICD-10-CM | POA: Diagnosis not present

## 2022-02-28 DIAGNOSIS — N920 Excessive and frequent menstruation with regular cycle: Secondary | ICD-10-CM

## 2022-02-28 DIAGNOSIS — Z01419 Encounter for gynecological examination (general) (routine) without abnormal findings: Secondary | ICD-10-CM | POA: Diagnosis not present

## 2022-02-28 MED ORDER — LEVONORGEST-ETH ESTRAD 91-DAY 0.15-0.03 MG PO TABS: 1.0000 | ORAL_TABLET | Freq: Every day | ORAL | 4 refills | Status: DC

## 2022-02-28 MED ORDER — VALACYCLOVIR HCL 500 MG PO TABS: ORAL_TABLET | ORAL | 1 refills | Status: DC

## 2022-02-28 NOTE — Progress Notes (Unsigned)
Switch to seasonale Unpredictable periods  Taks 1gm qday

## 2022-03-01 LAB — RPR+HBSAG+HCVAB+...
HIV Screen 4th Generation wRfx: NONREACTIVE
Hep C Virus Ab: NONREACTIVE
Hepatitis B Surface Ag: NEGATIVE
RPR Ser Ql: NONREACTIVE

## 2022-03-04 LAB — CYTOLOGY - PAP
Chlamydia: NEGATIVE
Comment: NEGATIVE
Comment: NEGATIVE
Comment: NORMAL
Neisseria Gonorrhea: NEGATIVE
Trichomonas: NEGATIVE

## 2022-03-13 ENCOUNTER — Other Ambulatory Visit: Payer: Self-pay

## 2022-03-13 DIAGNOSIS — N898 Other specified noninflammatory disorders of vagina: Secondary | ICD-10-CM

## 2022-03-13 MED ORDER — METRONIDAZOLE 500 MG PO TABS: 500.0000 mg | ORAL_TABLET | Freq: Two times a day (BID) | ORAL | 0 refills | Status: DC

## 2022-03-13 NOTE — Progress Notes (Signed)
Rx sent per protocol for BV sx's.  Pt will need self swab if no relief after treatment.

## 2022-03-14 ENCOUNTER — Other Ambulatory Visit: Payer: Self-pay | Admitting: *Deleted

## 2022-03-14 ENCOUNTER — Encounter: Payer: Self-pay | Admitting: Obstetrics and Gynecology

## 2022-03-14 DIAGNOSIS — N898 Other specified noninflammatory disorders of vagina: Secondary | ICD-10-CM

## 2022-03-14 NOTE — Telephone Encounter (Signed)
erroneous

## 2022-03-22 DIAGNOSIS — Z419 Encounter for procedure for purposes other than remedying health state, unspecified: Secondary | ICD-10-CM | POA: Diagnosis not present

## 2022-03-24 ENCOUNTER — Other Ambulatory Visit: Payer: Self-pay | Admitting: Obstetrics and Gynecology

## 2022-03-24 DIAGNOSIS — A6004 Herpesviral vulvovaginitis: Secondary | ICD-10-CM

## 2022-04-11 ENCOUNTER — Ambulatory Visit (INDEPENDENT_AMBULATORY_CARE_PROVIDER_SITE_OTHER): Payer: Medicaid Other

## 2022-04-11 ENCOUNTER — Other Ambulatory Visit (HOSPITAL_COMMUNITY)
Admission: RE | Admit: 2022-04-11 | Discharge: 2022-04-11 | Disposition: A | Discharge status: Home / Self Care | Payer: Medicaid Other | Source: Ambulatory Visit | Attending: Obstetrics & Gynecology | Admitting: Obstetrics & Gynecology

## 2022-04-11 VITALS — BP 120/80 | HR 65

## 2022-04-11 DIAGNOSIS — Z113 Encounter for screening for infections with a predominantly sexual mode of transmission: Secondary | ICD-10-CM

## 2022-04-11 DIAGNOSIS — N760 Acute vaginitis: Secondary | ICD-10-CM

## 2022-04-11 NOTE — Progress Notes (Signed)
SUBJECTIVE:  21 y.o. female who desires a STI screen. Notes abnormal vaginal discharge, No bleeding or significant pelvic pain. No UTI symptoms. Denies history of known exposure to STD.  No LMP recorded. (Menstrual status: Oral contraceptives).  OBJECTIVE:  She appears well.   ASSESSMENT:  STI Screen  Vaginitis  PLAN:  Pt offered STI blood screening-not indicated GC, chlamydia, and trichomonas ,BV and yeastprobe sent to lab.  Treatment: To be determined once lab results are received.  Pt follow up as needed.

## 2022-04-13 LAB — CERVICOVAGINAL ANCILLARY ONLY
Bacterial Vaginitis (gardnerella): POSITIVE — AB
Candida Glabrata: NEGATIVE
Candida Vaginitis: POSITIVE — AB
Chlamydia: NEGATIVE
Comment: NEGATIVE
Comment: NEGATIVE
Comment: NEGATIVE
Comment: NEGATIVE
Comment: NEGATIVE
Comment: NORMAL
Neisseria Gonorrhea: NEGATIVE
Trichomonas: NEGATIVE

## 2022-04-17 MED ORDER — TINIDAZOLE 500 MG PO TABS: 2.0000 g | ORAL_TABLET | Freq: Every day | ORAL | 2 refills | Status: DC

## 2022-04-17 MED ORDER — FLUCONAZOLE 150 MG PO TABS: 150.0000 mg | ORAL_TABLET | ORAL | 3 refills | Status: DC

## 2022-04-17 NOTE — Addendum Note (Signed)
Addended by: Jaynie Collins A on: 04/17/2022 12:04 PM   Modules accepted: Orders

## 2022-04-18 ENCOUNTER — Encounter: Payer: Self-pay | Admitting: Obstetrics & Gynecology

## 2022-04-18 MED ORDER — METRONIDAZOLE 500 MG PO TABS: 500.0000 mg | ORAL_TABLET | Freq: Two times a day (BID) | ORAL | 0 refills | Status: AC

## 2022-04-18 NOTE — Addendum Note (Signed)
Addended by: Jaynie Collins A on: 04/18/2022 03:41 PM   Modules accepted: Orders

## 2022-04-21 DIAGNOSIS — Z419 Encounter for procedure for purposes other than remedying health state, unspecified: Secondary | ICD-10-CM | POA: Diagnosis not present

## 2022-04-26 ENCOUNTER — Other Ambulatory Visit: Payer: Self-pay | Admitting: *Deleted

## 2022-04-26 MED ORDER — SULFAMETHOXAZOLE-TRIMETHOPRIM 800-160 MG PO TABS
1.0000 | ORAL_TABLET | Freq: Two times a day (BID) | ORAL | 0 refills | Status: DC
Start: 2022-04-26 — End: 2022-06-12

## 2022-05-02 ENCOUNTER — Ambulatory Visit: Payer: Medicaid Other | Admitting: Obstetrics & Gynecology

## 2022-05-22 DIAGNOSIS — Z419 Encounter for procedure for purposes other than remedying health state, unspecified: Secondary | ICD-10-CM | POA: Diagnosis not present

## 2022-05-25 ENCOUNTER — Other Ambulatory Visit: Payer: Self-pay | Admitting: *Deleted

## 2022-05-25 ENCOUNTER — Ambulatory Visit (INDEPENDENT_AMBULATORY_CARE_PROVIDER_SITE_OTHER): Payer: Medicaid Other | Admitting: *Deleted

## 2022-05-25 DIAGNOSIS — N912 Amenorrhea, unspecified: Secondary | ICD-10-CM

## 2022-05-25 LAB — POCT URINE PREGNANCY: Preg Test, Ur: NEGATIVE

## 2022-05-25 MED ORDER — NORGESTIMATE-ETH ESTRADIOL 0.25-35 MG-MCG PO TABS: 1.0000 | ORAL_TABLET | Freq: Every day | ORAL | 3 refills | Status: DC

## 2022-05-25 NOTE — Progress Notes (Signed)
Sharon Steele here for a UPT. Pt had a negative upt at home. Pt was concerned about being 7 days late, but did miss a whole week of birth control.    UPT in office Negative.    Pt to follow up as needed.

## 2022-05-25 NOTE — Progress Notes (Deleted)
Sharon Steele here for a UPT. Pt had a {Desc; negative/positive:13464} upt at home. LMP is ***.     UPT in office {DESC;Negative/Positive:115700014}.    Reviewed medications and informed to start a PNV, if not already. Pt to follow up in {1-6 weeks:20341} for New OB visit.

## 2022-05-26 NOTE — Progress Notes (Signed)
Patient was assessed and managed by nursing staff during this encounter. I have reviewed the chart and agree with the documentation and plan. I have also made any necessary editorial changes.  Aletha Halim, MD 05/26/2022 1:03 PM

## 2022-06-05 ENCOUNTER — Ambulatory Visit (INDEPENDENT_AMBULATORY_CARE_PROVIDER_SITE_OTHER): Payer: Medicaid Other

## 2022-06-05 VITALS — BP 125/82 | HR 65

## 2022-06-05 DIAGNOSIS — N912 Amenorrhea, unspecified: Secondary | ICD-10-CM

## 2022-06-05 DIAGNOSIS — Z3201 Encounter for pregnancy test, result positive: Secondary | ICD-10-CM

## 2022-06-05 LAB — POCT URINE PREGNANCY: Preg Test, Ur: POSITIVE — AB

## 2022-06-05 NOTE — Progress Notes (Signed)
Sharon Steele here for a UPT. Pt had a negative upt at home. LMP is 05/20/22.     UPT in office Positive.    Reviewed medications and informed to start a PNV, if not already. Pt to follow up and let office know how she plans to move forward with pregnancy pt unsure at this time.

## 2022-06-06 LAB — BETA HCG QUANT (REF LAB): hCG Quant: 1633 m[IU]/mL

## 2022-06-12 ENCOUNTER — Inpatient Hospital Stay (HOSPITAL_COMMUNITY): Payer: Medicaid Other

## 2022-06-12 ENCOUNTER — Encounter (HOSPITAL_COMMUNITY): Payer: Self-pay | Admitting: *Deleted

## 2022-06-12 ENCOUNTER — Other Ambulatory Visit: Payer: Self-pay

## 2022-06-12 ENCOUNTER — Inpatient Hospital Stay (HOSPITAL_COMMUNITY)
Admission: AD | Admit: 2022-06-12 | Discharge: 2022-06-12 | Disposition: A | Discharge status: Home / Self Care | Payer: Medicaid Other | Attending: Family Medicine | Admitting: Family Medicine

## 2022-06-12 DIAGNOSIS — Z3A01 Less than 8 weeks gestation of pregnancy: Secondary | ICD-10-CM

## 2022-06-12 DIAGNOSIS — O26891 Other specified pregnancy related conditions, first trimester: Secondary | ICD-10-CM | POA: Diagnosis not present

## 2022-06-12 DIAGNOSIS — B9689 Other specified bacterial agents as the cause of diseases classified elsewhere: Secondary | ICD-10-CM

## 2022-06-12 DIAGNOSIS — N760 Acute vaginitis: Secondary | ICD-10-CM | POA: Diagnosis not present

## 2022-06-12 LAB — URINALYSIS, ROUTINE W REFLEX MICROSCOPIC
Bilirubin Urine: NEGATIVE
Glucose, UA: NEGATIVE mg/dL
Hgb urine dipstick: NEGATIVE
Ketones, ur: NEGATIVE mg/dL
Leukocytes,Ua: NEGATIVE
Nitrite: NEGATIVE
Protein, ur: NEGATIVE mg/dL
Specific Gravity, Urine: 1.024 (ref 1.005–1.030)
pH: 6 (ref 5.0–8.0)

## 2022-06-12 LAB — CBC
HCT: 35.1 % — ABNORMAL LOW (ref 36.0–46.0)
Hemoglobin: 11.9 g/dL — ABNORMAL LOW (ref 12.0–15.0)
MCH: 30.8 pg (ref 26.0–34.0)
MCHC: 33.9 g/dL (ref 30.0–36.0)
MCV: 90.9 fL (ref 80.0–100.0)
Platelets: 265 10*3/uL (ref 150–400)
RBC: 3.86 MIL/uL — ABNORMAL LOW (ref 3.87–5.11)
RDW: 11.7 % (ref 11.5–15.5)
WBC: 7.7 10*3/uL (ref 4.0–10.5)
nRBC: 0 % (ref 0.0–0.2)

## 2022-06-12 LAB — HCG, QUANTITATIVE, PREGNANCY: hCG, Beta Chain, Quant, S: 29389 m[IU]/mL — ABNORMAL HIGH (ref <5)

## 2022-06-12 LAB — WET PREP, GENITAL
Sperm: NONE SEEN
Trich, Wet Prep: NONE SEEN
WBC, Wet Prep HPF POC: <10 (ref <10)
Yeast Wet Prep HPF POC: NONE SEEN

## 2022-06-12 MED ORDER — METRONIDAZOLE 500 MG PO TABS: 500.0000 mg | ORAL_TABLET | Freq: Two times a day (BID) | ORAL | 0 refills | Status: AC

## 2022-06-12 MED ORDER — PROMETHAZINE HCL 25 MG PO TABS: 25.0000 mg | ORAL_TABLET | Freq: Four times a day (QID) | ORAL | 2 refills | Status: DC | PRN

## 2022-06-12 MED ORDER — METRONIDAZOLE 500 MG PO TABS: 500.0000 mg | ORAL_TABLET | Freq: Three times a day (TID) | ORAL | 0 refills | Status: DC

## 2022-06-12 MED ORDER — FLUCONAZOLE 150 MG PO TABS: 150.0000 mg | ORAL_TABLET | Freq: Once | ORAL | 0 refills | Status: AC

## 2022-06-12 NOTE — MAU Provider Note (Signed)
History     CSN: [id number]  Arrival date and time: 06/12/22 1411    Chief Complaint  Patient presents with   Abdominal Pain   HPI This is a 22 year old G1, P0 at 7 weeks and 4 days who presents with left-sided abdominal pain/pelvic pain that started last Thursday.  Pain has been increasing since that time.  She does have yellowish vaginal discharge but has not slight odor to it.  The pain is worse when she is sitting or laying down.  This improves somewhat when she is standing and walking around.  She denies vaginal bleeding.  The patient does radiate up her abdomen.  Occasionally radiates down into her vagina.  No other palliating or  provoking factors.  OB History     Gravida  1   Para  0   Term  0   Preterm  0   AB  0   Living  0      SAB  0   IAB  0   Ectopic  0   Multiple  0   Live Births  0           Past Medical History:  Diagnosis Date   HSV (herpes simplex virus) infection     No past surgical history on file.  Family History  Problem Relation Age of Onset   Cancer Mother    Hypertension Father    Hypertension Paternal Grandfather    Hypertension Paternal Grandmother    Diabetes Maternal Grandmother    Diabetes Maternal Grandfather    Hypertension Paternal Aunt    Multiple sclerosis Paternal Aunt     Social History   Tobacco Use   Smoking status: Never   Smokeless tobacco: Never  Vaping Use   Vaping Use: Never used  Substance Use Topics   Alcohol use: Yes   Drug use: Not Currently    Allergies:  Allergies  Allergen Reactions   Cashew Nut Oil Hives, Itching and Swelling   Almond Oil     Medications Prior to Admission  Medication Sig Dispense Refill Last Dose   sulfamethoxazole-trimethoprim (BACTRIM DS) 800-160 MG tablet Take 1 tablet by mouth 2 (two) times daily. (Patient not taking: Reported on 06/05/2022) 6 tablet 0    fluconazole (DIFLUCAN) 150 MG tablet Take 1 tablet (150 mg total) by mouth every 3 (three) days. For  three doses (Patient not taking: Reported on 06/05/2022) 3 tablet 3    ISOtretinoin Micronized (ABSORICA LD) 24 MG CAPS Take 1 capsule by mouth daily. (Patient not taking: Reported on 06/05/2022) 30 capsule 0    ISOtretinoin Micronized (ABSORICA LD) 32 MG CAPS Take 1 tablet daily with 1 Absorica LD 24 mg tablet (Patient not taking: Reported on 06/30/2021) 30 capsule 0    ISOtretinoin Micronized (ABSORICA LD) 32 MG CAPS Take 1 capsule by mouth daily. (Patient not taking: Reported on 06/05/2022) 30 capsule 0    levonorgestrel-ethinyl estradiol (SEASONALE) 0.15-0.03 MG tablet Take 1 tablet by mouth daily. (Patient not taking: Reported on 06/05/2022) 91 tablet 4    norgestimate-ethinyl estradiol (ORTHO-CYCLEN) 0.25-35 MG-MCG tablet Take 1 tablet by mouth daily. (Patient not taking: Reported on 06/05/2022) 84 tablet 3    promethazine-dextromethorphan (PROMETHAZINE-DM) 6.25-15 MG/5ML syrup Take 5 mLs by mouth 3 (three) times daily as needed for cough. (Patient not taking: Reported on 06/30/2021) 118 mL 0    spironolactone (ALDACTONE) 100 MG tablet Take 1 tablet at bedtime (Patient not taking: Reported on 06/05/2022) 30 tablet 3  valACYclovir (VALTREX) 500 MG tablet For episodes: 1 tab po bid x 3d. For daily suppression: 1 tab po qday 30 tablet 1     Review of Systems Physical Exam   Blood pressure 123/81, pulse 87, temperature 98.5 F (36.9 C), temperature source Oral, resp. rate 18, height 5' (1.524 m), weight 71.8 kg, last menstrual period 04/20/2022, SpO2 100 %.  Physical Exam Vitals and nursing note reviewed.  Constitutional:      Appearance: She is well-developed.  HENT:     Head: Normocephalic and atraumatic.  Cardiovascular:     Rate and Rhythm: Normal rate.  Pulmonary:     Effort: Pulmonary effort is normal.  Abdominal:     General: Abdomen is flat.     Palpations: Abdomen is soft.     Tenderness: There is abdominal tenderness in the left lower quadrant. There is guarding. There is no  rebound.  Skin:    General: Skin is warm and dry.  Neurological:     Mental Status: She is alert.    Results for orders placed or performed during the hospital encounter of 06/12/22 (from the past 24 hour(s))  Urinalysis, Routine w reflex microscopic Urine, Clean Catch     Status: Abnormal   Collection Time: 06/12/22  3:28 PM  Result Value Ref Range   Color, Urine YELLOW YELLOW   APPearance HAZY (A) CLEAR   Specific Gravity, Urine 1.024 1.005 - 1.030   pH 6.0 5.0 - 8.0   Glucose, UA NEGATIVE NEGATIVE mg/dL   Hgb urine dipstick NEGATIVE NEGATIVE   Bilirubin Urine NEGATIVE NEGATIVE   Ketones, ur NEGATIVE NEGATIVE mg/dL   Protein, ur NEGATIVE NEGATIVE mg/dL   Nitrite NEGATIVE NEGATIVE   Leukocytes,Ua NEGATIVE NEGATIVE  ABO/Rh     Status: None   Collection Time: 06/12/22  5:18 PM  Result Value Ref Range   ABO/RH(D)      AB POS Performed at [facility], [address] [place], [place] [address]   CBC     Status: Abnormal   Collection Time: 06/12/22  5:18 PM  Result Value Ref Range   WBC 7.7 4.0 - 10.5 K/uL   RBC 3.86 (L) 3.87 - 5.11 MIL/uL   Hemoglobin 11.9 (L) 12.0 - 15.0 g/dL   HCT 19.1 (L) 47.8 - 29.5 %   MCV 90.9 80.0 - 100.0 fL   MCH 30.8 26.0 - 34.0 pg   MCHC 33.9 30.0 - 36.0 g/dL   RDW 62.1 30.8 - 65.7 %   Platelets 265 150 - 400 K/uL   nRBC 0.0 0.0 - 0.2 %  Wet prep, genital     Status: Abnormal   Collection Time: 06/12/22  5:49 PM   Specimen: PATH Cytology Cervicovaginal Ancillary Only  Result Value Ref Range   Yeast Wet Prep HPF POC NONE SEEN NONE SEEN   Trich, Wet Prep NONE SEEN NONE SEEN   Clue Cells Wet Prep HPF POC PRESENT (A) NONE SEEN   WBC, Wet Prep HPF POC <10 <10   Sperm NONE SEEN    US OB LESS THAN 14 WEEKS WITH OB TRANSVAGINAL  Result Date: 06/12/2022 CLINICAL DATA:  Initial evaluation for acute pelvic pain, early pregnancy. EXAM: OBSTETRIC <14 WK [place] AND TRANSVAGINAL OB US TECHNIQUE: Both transabdominal and transvaginal ultrasound  examinations were performed for complete evaluation of the gestation as well as the maternal uterus, adnexal regions, and pelvic cul-de-sac. Transvaginal technique was performed to assess early pregnancy. COMPARISON:  None Available. FINDINGS: Intrauterine  gestational sac: Single Yolk sac:  Present Embryo:  Present Cardiac Activity: Present Heart Rate: 93 bpm MSD: 13.6 mm   6 w   2 d CRL:  1.5 mm                     [place] EDC: Too small to calculate. Subchorionic hemorrhage:  None visualized. Maternal uterus/adnexae: Right ovary within normal limits. Small degenerating corpus luteal cyst noted within the left ovary. No adnexal mass or free fluid IMPRESSION: 1. Single viable IUP, estimated gestational age [redacted] weeks and 2 days by mean sac diameter. No complication. 2. Small degenerating left ovarian corpus luteal cyst. Finding could contribute to acute pelvic pain. 3. No other acute maternal uterine or adnexal abnormality. Electronically Signed   By: Jeannine Boga M.D.   On: 06/12/2022 18:24     MAU Course  Procedures  MDM CBC, HCG, ABO/rh, [place]. Vaginal cultures.  Assessment and Plan   1. [redacted] weeks gestation of pregnancy   2. BV (bacterial vaginosis)    Flagyl prescribed. IUP seen on [place]. Patient discharged to home with return precautions.  Sharon Steele 06/12/2022, 5:12 PM

## 2022-06-12 NOTE — MAU Note (Signed)
Sharon Steele is a 22 y.o. at Unknown here in MAU reporting: having in left lower abdomen that's initially dull and then intensifies to sharp and travels into left arm.  States pain happens when she' s is still, not moving.  Denies VB. LMP: 04/20/2022 Onset of complaint: last Wednesday Pain score: 8 Vitals:   06/12/22 1446  BP: 123/81  Pulse: 87  Resp: 18  Temp: 98.5 F (36.9 C)  SpO2: 100%     FHT:NA Lab orders placed from triage:   UA

## 2022-06-13 LAB — ABO/RH: ABO/RH(D): AB POS

## 2022-06-13 LAB — GC/CHLAMYDIA PROBE AMP (~~LOC~~) NOT AT ARMC
Chlamydia: NEGATIVE
Comment: NEGATIVE
Comment: NORMAL
Neisseria Gonorrhea: NEGATIVE

## 2022-06-22 DIAGNOSIS — Z419 Encounter for procedure for purposes other than remedying health state, unspecified: Secondary | ICD-10-CM | POA: Diagnosis not present

## 2022-07-04 ENCOUNTER — Encounter: Payer: Self-pay | Admitting: Obstetrics and Gynecology

## 2022-07-04 ENCOUNTER — Ambulatory Visit (INDEPENDENT_AMBULATORY_CARE_PROVIDER_SITE_OTHER): Payer: Medicaid Other | Admitting: Obstetrics and Gynecology

## 2022-07-04 VITALS — BP 118/77 | HR 79 | Wt 161.8 lb

## 2022-07-04 DIAGNOSIS — Z5189 Encounter for other specified aftercare: Secondary | ICD-10-CM

## 2022-07-04 DIAGNOSIS — O039 Complete or unspecified spontaneous abortion without complication: Secondary | ICD-10-CM

## 2022-07-04 DIAGNOSIS — R87612 Low grade squamous intraepithelial lesion on cytologic smear of cervix (LGSIL): Secondary | ICD-10-CM

## 2022-07-04 MED ORDER — NORGESTIMATE-ETH ESTRADIOL 0.25-35 MG-MCG PO TABS: 1.0000 | ORAL_TABLET | Freq: Every day | ORAL | 3 refills | Status: DC

## 2022-07-04 NOTE — Progress Notes (Signed)
CC: SAB follow up  Denies any bleeding in the last several days  Does note cramping, and nausea  Discuss BC

## 2022-07-04 NOTE — Patient Instructions (Signed)
Aida Raider RN, BSN [place]  [facility] High Risk [phone]

## 2022-07-04 NOTE — Progress Notes (Signed)
Obstetrics and Gynecology Visit Return Patient Evaluation  Appointment Date: 07/04/2022  Primary Care Provider: [facility]  OBGYN Clinic: [facility]  Chief Complaint: follow up SAB  History of Present Illness:  Sharon Steele is a 22 y.o. G1P0010 s/p early SAB late january. Pt went to [facility] on 1/22 and diagnosed with an IUP but HR below 100. She had an SAB a few days later.   No current s/s  Review of Systems: as noted in the History of Present Illness.  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 Current Outpatient Medications  Medication Sig Dispense Refill   norgestimate-ethinyl estradiol (ORTHO-CYCLEN) 0.25-35 MG-MCG tablet Take 1 tablet by mouth daily. 4 tablet 3   valACYclovir (VALTREX) 500 MG tablet For episodes: 1 tab po bid x 3d. For daily suppression: 1 tab po qday 30 tablet 1   promethazine (PHENERGAN) 25 MG tablet Take 1 tablet (25 mg total) by mouth every 6 (six) hours as needed for nausea or vomiting. (Patient not taking: Reported on 07/04/2022) 30 tablet 2   No current facility-administered medications for this visit.    Allergies: is allergic to cashew nut oil and almond oil.  Physical Exam:  BP 118/77   Pulse 79   Wt 161 lb 12.8 oz (73.4 kg)   LMP 04/20/2022 (Exact Date)   BMI 31.60 kg/m  Body mass index is 31.6 kg/m. General appearance: Well nourished, well developed female in no acute distress.  Neuro/Psych:  Normal mood and affect.    Assessment: patient doing well  Plan:  1. Follow-up visit after miscarriage Patient was on seasonale when she got pregnant but she didn't like it and would like to get back on ortho-cyclen. Rx given and d/w her that  she can see if skipping the placebo weeks and only doing those weeks each season will help with her heavy painful periods.   Patient healing well emotionally. PRN resources given.   2. LGSIL on Pap smear of cervix Reminded patient of need for repeat late this year   RTC: October for pap  Durene Romans MD Attending [facility])

## 2022-07-21 DIAGNOSIS — Z419 Encounter for procedure for purposes other than remedying health state, unspecified: Secondary | ICD-10-CM | POA: Diagnosis not present

## 2022-07-27 ENCOUNTER — Emergency Department (HOSPITAL_BASED_OUTPATIENT_CLINIC_OR_DEPARTMENT_OTHER): Payer: Medicaid Other

## 2022-07-27 ENCOUNTER — Emergency Department (HOSPITAL_BASED_OUTPATIENT_CLINIC_OR_DEPARTMENT_OTHER)
Admission: EM | Admit: 2022-07-27 | Discharge: 2022-07-27 | Disposition: A | Discharge status: Home / Self Care | Payer: Medicaid Other | Attending: Emergency Medicine | Admitting: Emergency Medicine

## 2022-07-27 ENCOUNTER — Encounter (HOSPITAL_BASED_OUTPATIENT_CLINIC_OR_DEPARTMENT_OTHER): Payer: Self-pay | Admitting: Emergency Medicine

## 2022-07-27 ENCOUNTER — Other Ambulatory Visit: Payer: Self-pay

## 2022-07-27 DIAGNOSIS — O039 Complete or unspecified spontaneous abortion without complication: Secondary | ICD-10-CM | POA: Diagnosis not present

## 2022-07-27 DIAGNOSIS — O209 Hemorrhage in early pregnancy, unspecified: Secondary | ICD-10-CM | POA: Diagnosis present

## 2022-07-27 DIAGNOSIS — Z3A00 Weeks of gestation of pregnancy not specified: Secondary | ICD-10-CM | POA: Insufficient documentation

## 2022-07-27 LAB — CBC WITH DIFFERENTIAL/PLATELET
Abs Immature Granulocytes: 0.01 10*3/uL (ref 0.00–0.07)
Basophils Absolute: 0 10*3/uL (ref 0.0–0.1)
Basophils Relative: 0 %
Eosinophils Absolute: 0.1 10*3/uL (ref 0.0–0.5)
Eosinophils Relative: 2 %
HCT: 33.3 % — ABNORMAL LOW (ref 36.0–46.0)
Hemoglobin: 11.4 g/dL — ABNORMAL LOW (ref 12.0–15.0)
Immature Granulocytes: 0 %
Lymphocytes Relative: 22 %
Lymphs Abs: 1.2 10*3/uL (ref 0.7–4.0)
MCH: 30.6 pg (ref 26.0–34.0)
MCHC: 34.2 g/dL (ref 30.0–36.0)
MCV: 89.3 fL (ref 80.0–100.0)
Monocytes Absolute: 0.5 10*3/uL (ref 0.1–1.0)
Monocytes Relative: 9 %
Neutro Abs: 3.8 10*3/uL (ref 1.7–7.7)
Neutrophils Relative %: 67 %
Platelets: 261 10*3/uL (ref 150–400)
RBC: 3.73 MIL/uL — ABNORMAL LOW (ref 3.87–5.11)
RDW: 12.5 % (ref 11.5–15.5)
WBC: 5.7 10*3/uL (ref 4.0–10.5)
nRBC: 0 % (ref 0.0–0.2)

## 2022-07-27 LAB — BASIC METABOLIC PANEL
Anion gap: 8 (ref 5–15)
BUN: 6 mg/dL (ref 6–20)
CO2: 26 mmol/L (ref 22–32)
Calcium: 9.9 mg/dL (ref 8.9–10.3)
Chloride: 102 mmol/L (ref 98–111)
Creatinine, Ser: 0.78 mg/dL (ref 0.44–1.00)
GFR, Estimated: >60 mL/min (ref >60)
Glucose, Bld: 89 mg/dL (ref 70–99)
Potassium: 3.4 mmol/L — ABNORMAL LOW (ref 3.5–5.1)
Sodium: 136 mmol/L (ref 135–145)

## 2022-07-27 LAB — HCG, SERUM, QUALITATIVE: Preg, Serum: POSITIVE — AB

## 2022-07-27 LAB — HCG, QUANTITATIVE, PREGNANCY: hCG, Beta Chain, Quant, S: 1562 m[IU]/mL — ABNORMAL HIGH (ref <5)

## 2022-07-27 MED ORDER — HYDROCODONE-ACETAMINOPHEN 5-325 MG PO TABS: 1.0000 | ORAL_TABLET | ORAL | 0 refills | Status: DC | PRN

## 2022-07-27 NOTE — ED Triage Notes (Addendum)
Pt in with abdominal cramping, reported as "severe". Pt states she had a miscarriage back in February, and this is her first period since (began bleeding yesterday). Reporting heavy bleeding with large clots, states she is using 4-5 pads/day. States some dizziness while standing

## 2022-07-27 NOTE — ED Notes (Signed)
Patient transported to Ultrasound by rad staff

## 2022-07-27 NOTE — Discharge Instructions (Signed)
You need to have your hormone levels rechecked within 48 hours.  Contact your OB/GYN tomorrow to have this arranged.  Return to the emergency room if you have any worsening symptoms such as worsening abdominal pain, heavier bleeding, fever, dizziness or other worsening symptoms.

## 2022-07-27 NOTE — ED Provider Notes (Signed)
[facility] Provider Note   CSN: [id number] Arrival date & time: 07/27/22  1857     History  Chief Complaint  Patient presents with   Abdominal Pain    Sharon Steele is a 22 y.o. female.  Patient is a 22 year old who presents with vaginal bleeding.  She had an intrauterine pregnancy on January 22 and shortly after had a spontaneous abortion.  She followed up with OB on February 13 and at that point was not having any issues.  She states that she had bled for about a week following the spontaneous abortion and then stop bleeding.  She started bleeding again about 2 weeks ago with some spotting and over the last 2 days has had some heavy bleeding and passing clots.  She is having some intense cramping of her lower abdomen.  No dizziness.  No fevers.  She has had an associated episode of vomiting related to the discomfort.       Home Medications Prior to Admission medications   Medication Sig Start Date End Date Taking? Authorizing Provider  HYDROcodone-acetaminophen (NORCO/VICODIN) 5-325 MG tablet Take 1 tablet by mouth every 4 (four) hours as needed. 07/27/22  Yes Malvin Johns, MD  norgestimate-ethinyl estradiol (ORTHO-CYCLEN) 0.25-35 MG-MCG tablet Take 1 tablet by mouth daily. 07/04/22   Aletha Halim, MD  promethazine (PHENERGAN) 25 MG tablet Take 1 tablet (25 mg total) by mouth every 6 (six) hours as needed for nausea or vomiting. Patient not taking: Reported on 07/04/2022 06/12/22   Truett Mainland, DO  valACYclovir (VALTREX) 500 MG tablet For episodes: 1 tab po bid x 3d. For daily suppression: 1 tab po qday 02/28/22   Aletha Halim, MD  cetirizine (ZYRTEC) 10 MG tablet Take 1 tablet (10 mg total) by mouth daily. For 3 days 09/06/16 11/24/19  Harlene Salts, MD  ipratropium (ATROVENT) 0.06 % nasal spray Place 2 sprays into both nostrils 4 (four) times daily. Patient not taking: Reported on 09/06/2016 07/04/16 11/24/19  Lysbeth Penner, FNP       Allergies    Cashew nut oil and Almond oil    Review of Systems   Review of Systems  Constitutional:  Negative for chills, diaphoresis, fatigue and fever.  HENT:  Negative for congestion, rhinorrhea and sneezing.   Eyes: Negative.   Respiratory:  Negative for cough, chest tightness and shortness of breath.   Cardiovascular:  Negative for chest pain and leg swelling.  Gastrointestinal:  Negative for abdominal pain, blood in stool, diarrhea, nausea and vomiting.  Genitourinary:  Positive for pelvic pain and vaginal bleeding. Negative for difficulty urinating, flank pain, frequency, hematuria and vaginal discharge.  Musculoskeletal:  Negative for arthralgias and back pain.  Skin:  Negative for rash.  Neurological:  Negative for dizziness, speech difficulty, weakness, numbness and headaches.    Physical Exam Updated Vital Signs BP 133/80   Pulse 82   Temp 98 F (36.7 C)   Resp 18   Ht 5' (1.524 m)   Wt 70.3 kg   LMP 07/25/2022 (Exact Date)   SpO2 99%   Breastfeeding No   BMI 30.27 kg/m  Physical Exam Constitutional:      Appearance: She is well-developed.  HENT:     Head: Normocephalic and atraumatic.  Eyes:     Pupils: Pupils are equal, round, and reactive to light.  Cardiovascular:     Rate and Rhythm: Normal rate and regular rhythm.     Heart sounds: Normal heart  sounds.  Pulmonary:     Effort: Pulmonary effort is normal. No respiratory distress.     Breath sounds: Normal breath sounds. No wheezing or rales.  Chest:     Chest wall: No tenderness.  Abdominal:     General: Bowel sounds are normal.     Palpations: Abdomen is soft.     Tenderness: There is abdominal tenderness in the suprapubic area. There is no guarding or rebound.  Genitourinary:    Comments: Some dark blood in the vaginal vault.  There is large piece of tissue in the vault that was removed.  There is some small amount of bleeding following that but no heavy bleeding.  No significant adnexal  tenderness or uterine tenderness.  No foul-smelling discharge. Musculoskeletal:        General: Normal range of motion.     Cervical back: Normal range of motion and neck supple.  Lymphadenopathy:     Cervical: No cervical adenopathy.  Skin:    General: Skin is warm and dry.     Findings: No rash.  Neurological:     Mental Status: She is alert and oriented to person, place, and time.     ED Results / Procedures / Treatments   Labs (all labs ordered are listed, but only abnormal results are displayed) Labs Reviewed  CBC WITH DIFFERENTIAL/PLATELET - Abnormal; Notable for the following components:      Result Value   RBC 3.73 (*)    Hemoglobin 11.4 (*)    HCT 33.3 (*)    All other components within normal limits  BASIC METABOLIC PANEL - Abnormal; Notable for the following components:   Potassium 3.4 (*)    All other components within normal limits  HCG, SERUM, QUALITATIVE - Abnormal; Notable for the following components:   Preg, Serum POSITIVE (*)    All other components within normal limits  HCG, QUANTITATIVE, PREGNANCY - Abnormal; Notable for the following components:   hCG, Beta Chain, Quant, S 1,562 (*)    All other components within normal limits    EKG None  Radiology US OB LESS THAN 14 WEEKS WITH OB TRANSVAGINAL  Result Date: 07/27/2022 CLINICAL DATA:  Vaginal bleeding, history of recent miscarriage, evaluate for retained products of conception EXAM: OBSTETRIC <14 WK [place] AND TRANSVAGINAL OB US TECHNIQUE: Both transabdominal and transvaginal ultrasound examinations were performed for complete evaluation of the gestation as well as the maternal uterus, adnexal regions, and pelvic cul-de-sac. Transvaginal technique was performed to assess early pregnancy. COMPARISON:  06/12/2022 FINDINGS: Intrauterine gestational sac: Absent Maternal uterus/adnexae: Ovaries are within normal limits. No evidence of retained products of conception are seen. IMPRESSION: No evidence of retained  products of conception. No acute abnormality noted. Electronically Signed   By: Inez Catalina M.D.   On: 07/27/2022 22:54    Procedures Procedures    Medications Ordered in ED Medications - No data to display  ED Course/ Medical Decision Making/ A&P                             Medical Decision Making Amount and/or Complexity of Data Reviewed Labs: ordered.  Risk Prescription drug management.   Patient is a 22 year old female who presents with vaginal bleeding.  She reports a spontaneous abortion in late January/early February.  On exam, she seems to be passing some tissue in her vaginal vault.  Once this was removed, there was any significant heavy bleeding.  She does have  some abdominal cramping but no significant discomfort on exam.  Her globin appears stable.  Her quant hCG is around 1500.  I discussed the patient with Dr. Ernestina Patches with OB/GYN.  Patient does report having unprotected sex following her last spontaneous abortion last month.  It is felt that potentially this represents a new pregnancy and that patient is having the second spontaneous abortion.  Patient had an ultrasound which showed no evidence of retained products.  No intrauterine pregnancy was noted.  Given the findings on pelvic exam, I suspect that she is having a spontaneous abortion.  Dr. Ernestina Patches recommended that she have close follow-up with the OB/GYN clinic to have her quant checked in 48 hours.  Patient is amenable to this.  She was given strict return precautions.  Final Clinical Impression(s) / ED Diagnoses Final diagnoses:  Spontaneous abortion    Rx / DC Orders ED Discharge Orders          Ordered    HYDROcodone-acetaminophen (NORCO/VICODIN) 5-325 MG tablet  Every 4 hours PRN        07/27/22 2349              Malvin Johns, MD 07/27/22 2353

## 2022-08-11 ENCOUNTER — Telehealth: Payer: Medicaid Other | Admitting: Nurse Practitioner

## 2022-08-11 DIAGNOSIS — N898 Other specified noninflammatory disorders of vagina: Secondary | ICD-10-CM

## 2022-08-11 DIAGNOSIS — R21 Rash and other nonspecific skin eruption: Secondary | ICD-10-CM

## 2022-08-11 NOTE — Progress Notes (Signed)
Because you have multiple things happening right now and overlapping symptoms , I feel your condition warrants further evaluation and I recommend that you be seen for a face to face visit.  It would be best to have some testing done to assure you receive the proper management.   Please contact your primary care physician practice to be seen. Many offices offer virtual options to be seen via video if you are not comfortable going in person to a medical facility at this time.  NOTE: You will NOT be charged for this eVisit.  If you do not have a PCP, [facility] offers a free physician referral service available at [phone]. Our trained staff has the experience, knowledge and resources to put you in touch with a physician who is right for you.    If you are having a true medical emergency please call 911.   Your e-visit answers were reviewed by a board certified advanced clinical practitioner to complete your personal care plan.  Thank you for using e-Visits.

## 2022-08-11 NOTE — Progress Notes (Signed)
Because you have multiple things happening right now and overlapping symptoms , I feel your condition warrants further evaluation and I recommend that you be seen for a face to face visit.  It would be best to have some testing done to assure you receive the proper management.   Please contact your primary care physician practice to be seen. Many offices offer virtual options to be seen via video if you are not comfortable going in person to a medical facility at this time.  NOTE: You will NOT be charged for this eVisit.  If you do not have a PCP, [facility] offers a free physician referral service available at [phone]. Our trained staff has the experience, knowledge and resources to put you in touch with a physician who is right for you.    If you are having a true medical emergency please call 911.   Your e-visit answers were reviewed by a board certified advanced clinical practitioner to complete your personal care plan.  Thank you for using e-Visits.

## 2022-08-21 DIAGNOSIS — Z419 Encounter for procedure for purposes other than remedying health state, unspecified: Secondary | ICD-10-CM | POA: Diagnosis not present

## 2022-08-30 ENCOUNTER — Ambulatory Visit (INDEPENDENT_AMBULATORY_CARE_PROVIDER_SITE_OTHER): Payer: Medicaid Other

## 2022-08-30 ENCOUNTER — Other Ambulatory Visit (HOSPITAL_COMMUNITY)
Admission: RE | Admit: 2022-08-30 | Discharge: 2022-08-30 | Disposition: A | Discharge status: Home / Self Care | Payer: Medicaid Other | Source: Ambulatory Visit | Attending: Family Medicine | Admitting: Family Medicine

## 2022-08-30 VITALS — BP 146/100 | HR 75

## 2022-08-30 DIAGNOSIS — R350 Frequency of micturition: Secondary | ICD-10-CM

## 2022-08-30 DIAGNOSIS — N898 Other specified noninflammatory disorders of vagina: Secondary | ICD-10-CM

## 2022-08-30 DIAGNOSIS — Z113 Encounter for screening for infections with a predominantly sexual mode of transmission: Secondary | ICD-10-CM

## 2022-08-30 NOTE — Progress Notes (Signed)
SUBJECTIVE:  22 y.o. female complains of  vaginal discharge for 1wk  and urinary frequency Denies abnormal vaginal bleeding or significant pelvic pain or fever.Denies history of known exposure to STD.  Patient's last menstrual period was 04/20/2022 (exact date).  OBJECTIVE:  She appears alert, well appearing, in no apparent distress Urine dipstick: not done.  ASSESSMENT:  Vaginal Discharge  Urine Frequency    PLAN:  GC, chlamydia, trichomonas, BVAG, CVAG probe and urine culture sent to lab. Treatment: To be determined once lab results are received ROV prn if symptoms persist or worsen.

## 2022-08-31 LAB — RPR+HBSAG+HCVAB+...
HIV Screen 4th Generation wRfx: NONREACTIVE
Hep C Virus Ab: NONREACTIVE
Hepatitis B Surface Ag: NEGATIVE
RPR Ser Ql: NONREACTIVE

## 2022-09-02 LAB — URINE CULTURE

## 2022-09-04 MED ORDER — SULFAMETHOXAZOLE-TRIMETHOPRIM 800-160 MG PO TABS: 1.0000 | ORAL_TABLET | Freq: Two times a day (BID) | ORAL | 0 refills | Status: AC

## 2022-09-04 NOTE — Addendum Note (Signed)
Addended by: [name] on: 09/04/2022 10:13 AM   Modules accepted: Orders

## 2022-09-05 ENCOUNTER — Ambulatory Visit: Payer: Medicaid Other | Admitting: Obstetrics & Gynecology

## 2022-09-05 LAB — CERVICOVAGINAL ANCILLARY ONLY
Bacterial Vaginitis (gardnerella): POSITIVE — AB
Candida Glabrata: NEGATIVE
Candida Vaginitis: NEGATIVE
Chlamydia: NEGATIVE
Comment: NEGATIVE
Comment: NEGATIVE
Comment: NEGATIVE
Comment: NEGATIVE
Comment: NEGATIVE
Comment: NORMAL
Neisseria Gonorrhea: NEGATIVE
Trichomonas: NEGATIVE

## 2022-09-06 ENCOUNTER — Other Ambulatory Visit: Payer: Self-pay | Admitting: *Deleted

## 2022-09-06 MED ORDER — NITROFURANTOIN MONOHYD MACRO 100 MG PO CAPS: 100.0000 mg | ORAL_CAPSULE | Freq: Two times a day (BID) | ORAL | 0 refills | Status: DC

## 2022-09-06 MED ORDER — METRONIDAZOLE 0.75 % VA GEL: 1.0000 | Freq: Every day | VAGINAL | 1 refills | Status: DC

## 2022-09-06 NOTE — Addendum Note (Signed)
Addended by: [name] on: 09/06/2022 09:30 AM   Modules accepted: Orders

## 2022-09-20 DIAGNOSIS — Z419 Encounter for procedure for purposes other than remedying health state, unspecified: Secondary | ICD-10-CM | POA: Diagnosis not present

## 2022-10-16 ENCOUNTER — Other Ambulatory Visit: Payer: Self-pay

## 2022-10-16 ENCOUNTER — Encounter (HOSPITAL_COMMUNITY): Payer: Self-pay | Admitting: Emergency Medicine

## 2022-10-16 ENCOUNTER — Ambulatory Visit (HOSPITAL_COMMUNITY)
Admission: EM | Admit: 2022-10-16 | Discharge: 2022-10-16 | Disposition: A | Discharge status: Home / Self Care | Payer: Medicaid Other | Attending: Physician Assistant | Admitting: Physician Assistant

## 2022-10-16 DIAGNOSIS — Z23 Encounter for immunization: Secondary | ICD-10-CM

## 2022-10-16 DIAGNOSIS — S70311A Abrasion, right thigh, initial encounter: Secondary | ICD-10-CM | POA: Diagnosis not present

## 2022-10-16 DIAGNOSIS — S90411A Abrasion, right great toe, initial encounter: Secondary | ICD-10-CM | POA: Diagnosis not present

## 2022-10-16 DIAGNOSIS — S50811A Abrasion of right forearm, initial encounter: Secondary | ICD-10-CM | POA: Diagnosis not present

## 2022-10-16 MED ORDER — TETANUS-DIPHTH-ACELL PERTUSSIS 5-2.5-18.5 LF-MCG/0.5 IM SUSY
PREFILLED_SYRINGE | INTRAMUSCULAR | Status: AC
  Filled 2022-10-16: qty 0.5

## 2022-10-16 MED ORDER — MUPIROCIN 2 % EX OINT: 1.0000 | TOPICAL_OINTMENT | Freq: Every day | CUTANEOUS | 1 refills | Status: DC

## 2022-10-16 MED ORDER — TETANUS-DIPHTH-ACELL PERTUSSIS 5-2.5-18.5 LF-MCG/0.5 IM SUSY
0.5000 mL | PREFILLED_SYRINGE | Freq: Once | INTRAMUSCULAR | Status: AC
  Administered 2022-10-16: 0.5 mL via INTRAMUSCULAR

## 2022-10-16 NOTE — ED Triage Notes (Signed)
Patient requesting few abrasions to be check for infection. She states it happens yesterday from a fall on a skateboard.

## 2022-10-16 NOTE — Discharge Instructions (Signed)
Keep area clean with soap and water.  Apply Bactroban ointment with dressing changes.  We updated your tetanus today.  If you have any signs of infection including drainage, fever, increasing redness, increasing pain you should be reevaluated immediately.

## 2022-10-16 NOTE — ED Provider Notes (Signed)
[facility]    CSN: [id number] Arrival date & time: 10/16/22  1912      History   Chief Complaint Chief Complaint  Patient presents with   Wound Check    Patient requesting few abrasions to be check for infection. She states it happens yesterday from a fall on a skateboard.    HPI Sharon Steele is a 22 y.o. female.   Patient presents today for evaluation of abrasions on her right forearm, upper thigh, great toe.  Reports that she was skateboarding with her boyfriend yesterday when she attempted to go down a hill lost her balance causing her to fall.  She did not hit her head and denies any associated loss of consciousness, headache, dizziness, nausea, vomiting.  She does not take any blood thinning medication.  Reports that she is able to move her extremities without any difficulty and has no focal tenderness.  Her primary concern today is abrasion that she is concerned might be getting infected.  She is unsure when her last tetanus was but believes it was when she was bit by dog; review of care everywhere shows that she was seen in the emergency room at [facility] but they did not update her tetanus as she reported being up-to-date on her childhood vaccines.  She is unsure when her last tetanus was.    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 No pertinent surgical history.  OB History     Gravida  1   Para  0   Term  0   Preterm  0   AB  1   Living  0      SAB  1   IAB  0   Ectopic  0   Multiple  0   Live Births  0            Home Medications    Prior to Admission medications   Medication Sig Start Date End Date Taking? Authorizing Provider  mupirocin  ointment (BACTROBAN) 2 % Apply 1 Application topically daily. 10/16/22  Yes Jermya Dowding, Noberto Retort, PA-C  norgestimate-ethinyl estradiol (ORTHO-CYCLEN) 0.25-35 MG-MCG tablet Take 1 tablet by mouth daily. 07/04/22   Martelle Bing, MD  valACYclovir (VALTREX) 500 MG tablet For episodes: 1 tab po bid x 3d. For daily suppression: 1 tab po qday 02/28/22   Polvadera Bing, MD  cetirizine (ZYRTEC) 10 MG tablet Take 1 tablet (10 mg total) by mouth daily. For 3 days 09/06/16 11/24/19  Ree Shay, MD  ipratropium (ATROVENT) 0.06 % nasal spray Place 2 sprays into both nostrils 4 (four) times daily. Patient not taking: Reported on 09/06/2016 07/04/16 11/24/19  Deatra Canter, FNP    Family History Family History  Problem Relation Age of Onset   Cancer Mother    Hypertension Father    Hypertension Paternal Grandfather    Hypertension Paternal Grandmother    Diabetes Maternal Grandmother    Diabetes Maternal Grandfather    Hypertension Paternal Aunt    Multiple sclerosis Paternal Aunt     Social History Social History   Tobacco Use   Smoking status: Never   Smokeless tobacco: Never  Vaping Use   Vaping Use: Never  used  Substance Use Topics   Alcohol use: Yes   Drug use: Not Currently     Allergies   Cashew nut oil, Almond oil, and Bactrim [sulfamethoxazole-trimethoprim]   Review of Systems Review of Systems  Constitutional:  Negative for activity change, appetite change, fatigue and fever.  Gastrointestinal:  Negative for abdominal pain, diarrhea, nausea and vomiting.  Musculoskeletal:  Negative for arthralgias and myalgias.  Skin:  Positive for wound. Negative for color change.  Neurological:  Negative for weakness and numbness.     Physical Exam Triage Vital Signs ED Triage Vitals [10/16/22 1922]  Enc Vitals Group     BP 108/75     Pulse Rate 85     Resp 18     Temp 98 F (36.7 C)     Temp Source Oral     SpO2 100 %     Weight 156 lb 8.4 oz (71 kg)     Height 5' (1.524 m)      Head Circumference      Peak Flow      Pain Score 0     Pain Loc      Pain Edu?      Excl. in GC?    No data found.  Updated Vital Signs BP 108/75 (BP Location: Right Arm)   Pulse 85   Temp 98 F (36.7 C) (Oral)   Resp 18   Ht 5' (1.524 m)   Wt 156 lb 8.4 oz (71 kg)   SpO2 100%   BMI 30.57 kg/m   Visual Acuity Right Eye Distance:   Left Eye Distance:   Bilateral Distance:    Right Eye Near:   Left Eye Near:    Bilateral Near:     Physical Exam Vitals reviewed.  Constitutional:      General: She is awake. She is not in acute distress.    Appearance: Normal appearance. She is well-developed. She is not ill-appearing.     Comments: Very pleasant female appears stated age in no acute distress sitting comfortably in exam room eating teddy grams  HENT:     Head: Normocephalic and atraumatic.  Cardiovascular:     Rate and Rhythm: Normal rate and regular rhythm.     Heart sounds: Normal heart sounds, S1 normal and S2 normal. No murmur heard. Pulmonary:     Effort: Pulmonary effort is normal.     Breath sounds: Normal breath sounds. No wheezing, rhonchi or rales.     Comments: Clear to auscultation bilaterally Musculoskeletal:     Comments: Normal active range of motion of all major joints.  Skin:    Findings: Abrasion present.       Psychiatric:        Behavior: Behavior is cooperative.      UC Treatments / Results  Labs (all labs ordered are listed, but only abnormal results are displayed) Labs Reviewed - No data to display  EKG   Radiology No results found.  Procedures Procedures (including critical care time)  Medications Ordered in UC Medications  Tdap (BOOSTRIX) injection 0.5 mL (0.5 mLs Intramuscular Given 10/16/22 1945)    Initial Impression / Assessment and Plan / UC Course  I have reviewed the triage vital signs and the nursing notes.  Pertinent labs & imaging results that were available during my care of the patient were reviewed by me  and considered in my medical decision making (see chart for details).     No indication for head and  neck CT based on [place] CT rules.  Plain films were deferred as patient has no significant focal bony tenderness and is able to use her joints without difficulty.  We discussed that if she develops any significant pain she can return and we will consider plain films.  Tetanus was updated since we do not know when her last tetanus was.  No evidence of infection that would warrant initiation of systemic antibiotics.  Wounds were dressed with nonstick bandages and bacitracin.  We discussed appropriate wound care including avoiding alcohol and peroxide and keeping clean with soap and water.  She will apply Bactroban ointment with dressing changes.  We discussed that if she has any evidence of infection including increasing drainage, change in character of drainage, fever, redness, pain she should be reevaluated.  Strict return precautions given.  Excuse note provided.  Final Clinical Impressions(s) / UC Diagnoses   Final diagnoses:  Abrasion of skin of right thigh  Abrasion of skin of right great toe  Abrasion of right forearm, initial encounter  Fall from skateboard, initial encounter     Discharge Instructions      Keep area clean with soap and water.  Apply Bactroban ointment with dressing changes.  We updated your tetanus today.  If you have any signs of infection including drainage, fever, increasing redness, increasing pain you should be reevaluated immediately.     ED Prescriptions     Medication Sig Dispense Auth. Provider   mupirocin ointment (BACTROBAN) 2 % Apply 1 Application topically daily. 30 g Sonya Gunnoe, Noberto Retort, PA-C      PDMP not reviewed this encounter.   Jeani Hawking, PA-C 10/16/22 1949

## 2022-10-18 ENCOUNTER — Telehealth (HOSPITAL_COMMUNITY): Payer: Self-pay | Admitting: Emergency Medicine

## 2022-10-18 NOTE — Telephone Encounter (Signed)
Patient called registration stating she is having burning and irritation when applying the bactroban she was prescribed Attempted return call to clarify, no answer Awaiting provider review

## 2022-10-19 NOTE — Telephone Encounter (Signed)
Per Denny Peon, APP "Bactroban does not usually burn but the abrasions can have a burning/stinging sensation with healing. Make sure she is washing the areas with a mild soap and water regularly. She should not be using any over the counter things such as hydrogen peroxide or alcohol or other ointments. If it is continuing to burn only after she is applying the bactroban (so it seems like the medicine and not just some healing) then she should stop it and can try plain bacitracin over the counter. "

## 2022-10-20 NOTE — Telephone Encounter (Signed)
Reviewed with patient, all questions answered, healing phases reviewed, and return precautions reviewed

## 2022-10-21 DIAGNOSIS — Z419 Encounter for procedure for purposes other than remedying health state, unspecified: Secondary | ICD-10-CM | POA: Diagnosis not present

## 2022-11-20 DIAGNOSIS — Z419 Encounter for procedure for purposes other than remedying health state, unspecified: Secondary | ICD-10-CM | POA: Diagnosis not present

## 2022-11-22 ENCOUNTER — Ambulatory Visit (INDEPENDENT_AMBULATORY_CARE_PROVIDER_SITE_OTHER): Payer: Medicaid Other

## 2022-11-22 ENCOUNTER — Other Ambulatory Visit (HOSPITAL_COMMUNITY)
Admission: RE | Admit: 2022-11-22 | Discharge: 2022-11-22 | Disposition: A | Discharge status: Home / Self Care | Payer: Medicaid Other | Source: Ambulatory Visit | Attending: Obstetrics and Gynecology | Admitting: Obstetrics and Gynecology

## 2022-11-22 VITALS — BP 134/90 | HR 78 | Wt 150.4 lb

## 2022-11-22 DIAGNOSIS — N898 Other specified noninflammatory disorders of vagina: Secondary | ICD-10-CM | POA: Diagnosis not present

## 2022-11-22 DIAGNOSIS — A6004 Herpesviral vulvovaginitis: Secondary | ICD-10-CM

## 2022-11-22 DIAGNOSIS — R300 Dysuria: Secondary | ICD-10-CM | POA: Diagnosis not present

## 2022-11-22 DIAGNOSIS — N926 Irregular menstruation, unspecified: Secondary | ICD-10-CM

## 2022-11-22 LAB — POCT URINALYSIS DIPSTICK
Blood, UA: NEGATIVE
Glucose, UA: NEGATIVE
Leukocytes, UA: NEGATIVE
Protein, UA: NEGATIVE

## 2022-11-22 LAB — POCT URINE PREGNANCY: Preg Test, Ur: NEGATIVE

## 2022-11-22 MED ORDER — NORGESTIMATE-ETH ESTRADIOL 0.25-35 MG-MCG PO TABS: 1.0000 | ORAL_TABLET | Freq: Every day | ORAL | 3 refills | Status: DC

## 2022-11-22 MED ORDER — VALACYCLOVIR HCL 500 MG PO TABS: ORAL_TABLET | ORAL | 1 refills | Status: DC

## 2022-11-22 NOTE — Progress Notes (Signed)
SUBJECTIVE:  22 y.o. female complains of white vaginal discharge for several days and dysuria, urinary urgency, and urinary frequency, UPT also ordered today as Pt stating that her Period was shorter than normal. Which was negative in office today   Denies abnormal vaginal bleeding or significant pelvic pain or fever.  Denies history of known exposure to STD.  No LMP recorded.  OBJECTIVE:  She appears alert, well appearing, in no apparent distress Urine dipstick: negative for all components.  ASSESSMENT:  Vaginal Discharge  Vaginal Odor Dysuria    PLAN:  GC, chlamydia, trichomonas, BVAG, CVAG probe and urine culture sent to lab. Treatment: To be determined once lab results are received ROV prn if symptoms persist or worsen.

## 2022-11-24 LAB — URINE CULTURE

## 2022-11-27 LAB — CERVICOVAGINAL ANCILLARY ONLY
Bacterial Vaginitis (gardnerella): POSITIVE — AB
Candida Glabrata: NEGATIVE
Candida Vaginitis: NEGATIVE
Chlamydia: NEGATIVE
Comment: NEGATIVE
Comment: NEGATIVE
Comment: NEGATIVE
Comment: NEGATIVE
Comment: NEGATIVE
Comment: NORMAL
Neisseria Gonorrhea: NEGATIVE
Trichomonas: NEGATIVE

## 2022-11-27 MED ORDER — METRONIDAZOLE 500 MG PO TABS: 500.0000 mg | ORAL_TABLET | Freq: Two times a day (BID) | ORAL | 0 refills | Status: DC

## 2022-11-27 NOTE — Addendum Note (Signed)
Addended by: Troy Bing on: 11/27/2022 05:56 PM   Modules accepted: Orders

## 2022-12-06 ENCOUNTER — Other Ambulatory Visit: Payer: Self-pay | Admitting: Obstetrics and Gynecology

## 2022-12-06 DIAGNOSIS — A6004 Herpesviral vulvovaginitis: Secondary | ICD-10-CM

## 2022-12-20 ENCOUNTER — Ambulatory Visit: Payer: Medicaid Other | Admitting: Family Medicine

## 2022-12-20 ENCOUNTER — Ambulatory Visit: Payer: Medicaid Other | Admitting: Dermatology

## 2022-12-20 ENCOUNTER — Encounter: Payer: Self-pay | Admitting: Family Medicine

## 2022-12-20 NOTE — Progress Notes (Signed)
Patient did not keep appointment today. She may call to reschedule.  

## 2022-12-21 DIAGNOSIS — Z419 Encounter for procedure for purposes other than remedying health state, unspecified: Secondary | ICD-10-CM | POA: Diagnosis not present

## 2022-12-26 ENCOUNTER — Ambulatory Visit (INDEPENDENT_AMBULATORY_CARE_PROVIDER_SITE_OTHER): Payer: Medicaid Other | Admitting: Dermatology

## 2022-12-26 ENCOUNTER — Encounter: Payer: Self-pay | Admitting: Dermatology

## 2022-12-26 DIAGNOSIS — L700 Acne vulgaris: Secondary | ICD-10-CM | POA: Diagnosis not present

## 2022-12-26 DIAGNOSIS — L732 Hidradenitis suppurativa: Secondary | ICD-10-CM

## 2022-12-26 DIAGNOSIS — Z7189 Other specified counseling: Secondary | ICD-10-CM

## 2022-12-26 MED ORDER — DOXYCYCLINE MONOHYDRATE 100 MG PO CAPS: 100.0000 mg | ORAL_CAPSULE | Freq: Two times a day (BID) | ORAL | 0 refills | Status: DC

## 2022-12-26 MED ORDER — SPIRONOLACTONE 50 MG PO TABS: ORAL_TABLET | ORAL | 0 refills | Status: DC

## 2022-12-26 NOTE — Progress Notes (Signed)
Follow-Up Visit   Subjective  Sharon Steele is a 23 y.o. female who presents for the following: acne and HS. Patient has used clindamycin topically and has taken spironolactone. Patient also did 8 weeks of isotretinoin and has had ILK injections. Tolerated well and acne was improving. Currently she is using Panoxyl, Clean and Clear and sunscreen.  She does have some scarring at breasts, acne is flared at face, chest and back, some cystic and worsens with menses.   The patient has spots, moles and lesions to be evaluated, some may be new or changing and the patient may have concern these could be cancer.   The following portions of the chart were reviewed this encounter and updated as appropriate: medications, allergies, medical history  Review of Systems:  No other skin or systemic complaints except as noted in HPI or Assessment and Plan.  Objective  Well appearing patient in no apparent distress; mood and affect are within normal limits.   A focused examination was performed of the following areas: Face, chest, back  Relevant exam findings are noted in the Assessment and Plan.    Assessment & Plan   ACNE VULGARIS Exam: Open comedones and inflammatory papules scattered on face. inflammatory edematous plaque on glabella. Scattered PIH on cheeks and back  Chronic and persistent condition with duration or expected duration over one year. Condition is bothersome/symptomatic for patient. Currently flared.   Treatment Plan: Start doxycycline 100 mg twice daily with food.  Start spironolactone 50 mg once daily for 2 weeks, if feeling okay may increase to 100 mg once daily.  Plan to restart isotretinoin.  Patient will log in to iPledge program and switch provider from Dr. [name] to Dr. Katrinka Blazing. Once patient has switched provider, will re-register patient. Patient will use oral birth control and female condoms for pregnancy prevention.  iPledge # [id number]   Doxycycline should be  taken with food to prevent nausea. Do not lay down for 30 minutes after taking. Be cautious with sun exposure and use good sun protection while on this medication. Pregnant women should not take this medication.   Spironolactone can cause increased urination and cause blood pressure to decrease. Please watch for signs of lightheadedness and be cautious when changing position. It can sometimes cause breast tenderness or an irregular period in premenopausal women. It can also increase potassium. The increase in potassium usually is not a concern unless you are taking other medicines that also increase potassium, so please be sure your doctor knows all of the other medications you are taking. This medication should not be taken by pregnant women.  This medicine should also not be taken together with sulfa drugs like Bactrim (trimethoprim/sulfamethexazole).   Isotretinoin Counseling; Review and Contraception Counseling: Reviewed potential side effects of isotretinoin including xerosis, cheilitis, hepatitis, hyperlipidemia, and severe birth defects if taken by a pregnant woman.  Women on isotretinoin must be celibate (not having sex) or required to use at least 2 birth control methods to prevent pregnancy (unless patient is a female of non-child bearing potential).  Females of child-bearing potential must have monthly pregnancy tests while on isotretinoin and report through I-Pledge (FDA monitoring program). Reviewed reports of suicidal ideation in those with a history of depression while taking isotretinoin and reports of diagnosis of inflammatory bowl disease (IBD) while taking isotretinoin as well as the lack of evidence for a causal relationship between isotretinoin, depression and IBD. Patient advised to reach out with any questions or concerns. Patient advised not  to share pills or donate blood while on treatment or for one month after completing treatment. All patient's considering Isotretinoin must read  and understand and sign Isotretinoin Consent Form and be registered with I-Pledge.  Urine pregnancy test performed in office today and was negative.  Patient demonstrates comprehension and confirms she will not get pregnant.        HIDRADENITIS SUPPURATIVA, currently uninflamed but with residual scarring, chronic, not at patient goal Exam: scarred plaques at inframammary skin bilaterally  Hidradenitis Suppurativa is a chronic; persistent; non-curable, but treatable condition due to abnormal inflamed sweat glands in the body folds (axilla, inframammary, groin, medial thighs), causing recurrent painful draining cysts and scarring. It can be associated with severe scarring acne and cysts; also abscesses and scarring of scalp. The goal is control and prevention of flares, as it is not curable. Scars are permanent and can be thickened. Treatment may include daily use of topical medication and oral antibiotics.  Oral isotretinoin may also be helpful.  For some cases, Humira or Cosentyx (biologic injections) may be prescribed to decrease the inflammatory process and prevent flares.  When indicated, inflamed cysts may also be treated surgically.  Treatment Plan: Scarring from HS can only be removed with excision. Would result in flat hypopigmented scar. Patient defers for now. May benefit from isotretinoin and spironolactone for acne   Return in about 30 days (around 01/25/2023) for isotretinoin start.  Anise Salvo, RMA, am acting as scribe for Elie Goody, MD .   Documentation: I have reviewed the above documentation for accuracy and completeness, and I agree with the above.  Elie Goody, MD

## 2022-12-26 NOTE — Patient Instructions (Addendum)
iPledge Program  [phone] ID # [id number]   Start doxycycline 100 mg twice daily with food.  Start spironolactone 50 mg once daily for 2 weeks, if feeling okay may increase to 100 mg once daily.  Plan to restart isotretinoin.  Patient will log in to iPledge program and switch provider from Dr. [name] to Dr. Katrinka Blazing. Once patient has switched provider, will re-register patient.  Spironolactone can cause increased urination and cause blood pressure to decrease. Please watch for signs of lightheadedness and be cautious when changing position. It can sometimes cause breast tenderness or an irregular period in premenopausal women. It can also increase potassium. The increase in potassium usually is not a concern unless you are taking other medicines that also increase potassium, so please be sure your doctor knows all of the other medications you are taking. This medication should not be taken by pregnant women.  This medicine should also not be taken together with sulfa drugs like Bactrim (trimethoprim/sulfamethexazole).   Doxycycline should be taken with food to prevent nausea. Do not lay down for 30 minutes after taking. Be cautious with sun exposure and use good sun protection while on this medication. Pregnant women should not take this medication.   Due to recent changes in healthcare laws, you may see results of your pathology and/or laboratory studies on MyChart before the doctors have had a chance to review them. We understand that in some cases there may be results that are confusing or concerning to you. Please understand that not all results are received at the same time and often the doctors may need to interpret multiple results in order to provide you with the best plan of care or course of treatment. Therefore, we ask that you please give [place] 2 business days to thoroughly review all your results before contacting the office for clarification. Should we see a critical lab result, you will be  contacted sooner.   If You Need Anything After Your Visit  If you have any questions or concerns for your doctor, please call our main line at [phone] and press option 4 to reach your doctor's medical assistant. If no one answers, please leave a voicemail as directed and we will return your call as soon as possible. Messages left after 4 pm will be answered the following business day.   You may also send [place] a message via MyChart. We typically respond to MyChart messages within 1-2 business days.  For prescription refills, please ask your pharmacy to contact our office. Our fax number is [phone].  If you have an urgent issue when the clinic is closed that cannot wait until the next business day, you can page your doctor at the number below.    Please note that while we do our best to be available for urgent issues outside of office hours, we are not available 24/7.   If you have an urgent issue and are unable to reach [place], you may choose to seek medical care at your doctor's office, retail clinic, urgent care center, or emergency room.  If you have a medical emergency, please immediately call 911 or go to the emergency department.  Pager Numbers  - Dr. Gwen Pounds: [phone]  - Dr. Roseanne Reno: [phone]  In the event of inclement weather, please call our main line at [phone] for an update on the status of any delays or closures.  Dermatology Medication Tips: Please keep the boxes that topical medications come in in order to help keep track of the  instructions about where and how to use these. Pharmacies typically print the medication instructions only on the boxes and not directly on the medication tubes.   If your medication is too expensive, please contact our office at [phone] option 4 or send [place] a message through MyChart.   We are unable to tell what your co-pay for medications will be in advance as this is different depending on your insurance coverage. However,  we may be able to find a substitute medication at lower cost or fill out paperwork to get insurance to cover a needed medication.   If a prior authorization is required to get your medication covered by your insurance company, please allow [place] 1-2 business days to complete this process.  Drug prices often vary depending on where the prescription is filled and some pharmacies may offer cheaper prices.  The website www.goodrx.com contains coupons for medications through different pharmacies. The prices here do not account for what the cost may be with help from insurance (it may be cheaper with your insurance), but the website can give you the price if you did not use any insurance.  - You can print the associated coupon and take it with your prescription to the pharmacy.  - You may also stop by our office during regular business hours and pick up a GoodRx coupon card.  - If you need your prescription sent electronically to a different pharmacy, notify our office through [facility] or by phone at [phone] option 4.

## 2023-01-01 ENCOUNTER — Encounter: Payer: Self-pay | Admitting: Dermatology

## 2023-01-04 ENCOUNTER — Ambulatory Visit: Payer: Medicaid Other

## 2023-01-18 ENCOUNTER — Other Ambulatory Visit: Payer: Self-pay | Admitting: Dermatology

## 2023-01-21 DIAGNOSIS — Z419 Encounter for procedure for purposes other than remedying health state, unspecified: Secondary | ICD-10-CM | POA: Diagnosis not present

## 2023-01-29 ENCOUNTER — Ambulatory Visit: Payer: Medicaid Other | Admitting: Dermatology

## 2023-02-15 ENCOUNTER — Ambulatory Visit: Payer: Medicaid Other | Admitting: Dermatology

## 2023-02-15 ENCOUNTER — Encounter: Payer: Self-pay | Admitting: Dermatology

## 2023-02-15 VITALS — BP 122/84

## 2023-02-15 DIAGNOSIS — L732 Hidradenitis suppurativa: Secondary | ICD-10-CM

## 2023-02-15 DIAGNOSIS — L700 Acne vulgaris: Secondary | ICD-10-CM

## 2023-02-15 DIAGNOSIS — Z79899 Other long term (current) drug therapy: Secondary | ICD-10-CM | POA: Diagnosis not present

## 2023-02-15 NOTE — Progress Notes (Signed)
Follow-Up Visit   Subjective  Sharon Steele is a 21 y.o. female who presents for the following: acne face and HS inframmary, 54m f/u, Doxycycline 100mg  1 po bid, Spironolactone 50mg  1 po qd, no s/e of oral medications  The patient has spots, moles and lesions to be evaluated, some may be new or changing and the patient may have concern these could be cancer.   The following portions of the chart were reviewed this encounter and updated as appropriate: medications, allergies, medical history  Review of Systems:  No other skin or systemic complaints except as noted in HPI or Assessment and Plan.  Objective  Well appearing patient in no apparent distress; mood and affect are within normal limits.   A focused examination was performed of the following areas: face  Relevant exam findings are noted in the Assessment and Plan.    Assessment & Plan   ACNE VULGARIS Face BP today  122/84 Exam: Open comedones and inflammatory papules scattered on face. inflammatory edematous plaque on glabella. Subcutaneous fluctuant nodules on right perinasal and right cheek. Scattered PIH. More severe than last visit  Chronic and persistent condition with duration or expected duration over one year. Condition is bothersome/symptomatic for patient. Currently flared.  Birth control - oral birth control, female latex condoms IPLEDGE # [id number]  Pharmacy - [facility]  Treatment Plan: Pending labs start Absorica 20mg  1 po qd  Cont Spironolactone 50mg  1 po qd D/C Doxycycline Acne could worsen after isotretinoin initiation. Discussed that patient should contact us for prednisone taper if this happens  Isotretinoin Counseling; Review and Contraception Counseling: Reviewed potential side effects of isotretinoin including xerosis, cheilitis, hepatitis, hyperlipidemia, and severe birth defects if taken by a pregnant woman.  Women on isotretinoin must be celibate (not having sex) or required to use  at least 2 birth control methods to prevent pregnancy (unless patient is a female of non-child bearing potential).  Females of child-bearing potential must have monthly pregnancy tests while on isotretinoin and report through I-Pledge (FDA monitoring program). Reviewed reports of suicidal ideation in those with a history of depression while taking isotretinoin and reports of diagnosis of inflammatory bowl disease (IBD) while taking isotretinoin as well as the lack of evidence for a causal relationship between isotretinoin, depression and IBD. Patient advised to reach out with any questions or concerns. Patient advised not to share pills or donate blood while on treatment or for one month after completing treatment. All patient's considering Isotretinoin must read and understand and sign Isotretinoin Consent Form and be registered with I-Pledge.   Spironolactone can cause increased urination and cause blood pressure to decrease. Please watch for signs of lightheadedness and be cautious when changing position. It can sometimes cause breast tenderness or an irregular period in premenopausal women. It can also increase potassium. The increase in potassium usually is not a concern unless you are taking other medicines that also increase potassium, so please be sure your doctor knows all of the other medications you are taking. This medication should not be taken by pregnant women.  This medicine should also not be taken together with sulfa drugs like Bactrim (trimethoprim/sulfamethexazole).    HIDRADENITIS SUPPURATIVA Bil inframammary Exam: not examined today  Chronic and persistent condition with duration or expected duration over one year. Condition is symptomatic / bothersome to patient. Not to goal.   Hidradenitis Suppurativa is a chronic; persistent; non-curable, but treatable condition due to abnormal inflamed sweat glands in the body folds (axilla, inframammary,  groin, medial thighs), causing  recurrent painful draining cysts and scarring. It can be associated with severe scarring acne and cysts; also abscesses and scarring of scalp. The goal is control and prevention of flares, as it is not curable. Scars are permanent and can be thickened. Treatment may include daily use of topical medication and oral antibiotics.  Oral isotretinoin may also be helpful.  For some cases, Humira or Cosentyx (biologic injections) may be prescribed to decrease the inflammatory process and prevent flares.  When indicated, inflamed cysts may also be treated surgically.  Treatment Plan: Pending labs start Isotretinoin 20mg  1 po qd  Cont Spironolactone 50mg  1 po qd   Return in about 1 month (around 03/17/2023) for Isotretinoin f/u.  I, Ardis Rowan, RMA, am acting as scribe for Elie Goody, MD .   Documentation: I have reviewed the above documentation for accuracy and completeness, and I agree with the above.  Elie Goody, MD

## 2023-02-15 NOTE — Patient Instructions (Addendum)
Your prescription was sent to [facility] in [place]. A representative from [facility] will contact you within 3 business hours to verify your address and insurance information to schedule a free delivery. If for any reason you do not receive a phone call from them, please reach out to them. Their phone number is [phone] and their hours are Monday-Friday 9:00 am-5:00 pm.     Due to recent changes in healthcare laws, you may see results of your pathology and/or laboratory studies on MyChart before the doctors have had a chance to review them. We understand that in some cases there may be results that are confusing or concerning to you. Please understand that not all results are received at the same time and often the doctors may need to interpret multiple results in order to provide you with the best plan of care or course of treatment. Therefore, we ask that you please give [place] 2 business days to thoroughly review all your results before contacting the office for clarification. Should we see a critical lab result, you will be contacted sooner.   If You Need Anything After Your Visit  If you have any questions or concerns for your doctor, please call our main line at [phone] and press option 4 to reach your doctor's medical assistant. If no one answers, please leave a voicemail as directed and we will return your call as soon as possible. Messages left after 4 pm will be answered the following business day.   You may also send [place] a message via MyChart. We typically respond to MyChart messages within 1-2 business days.  For prescription refills, please ask your pharmacy to contact our office. Our fax number is [phone].  If you have an urgent issue when the clinic is closed that cannot wait until the next business day, you can page your doctor at the number below.    Please note that while we do our best to be available for urgent issues outside of office hours, we are not  available 24/7.   If you have an urgent issue and are unable to reach [place], you may choose to seek medical care at your doctor's office, retail clinic, urgent care center, or emergency room.  If you have a medical emergency, please immediately call 911 or go to the emergency department.  Pager Numbers  - Dr. Gwen Pounds: [phone]  - Dr. Roseanne Reno: [phone]  - Dr. Katrinka Blazing: [phone]   In the event of inclement weather, please call our main line at [phone] for an update on the status of any delays or closures.  Dermatology Medication Tips: Please keep the boxes that topical medications come in in order to help keep track of the instructions about where and how to use these. Pharmacies typically print the medication instructions only on the boxes and not directly on the medication tubes.   If your medication is too expensive, please contact our office at [phone] option 4 or send [place] a message through MyChart.   We are unable to tell what your co-pay for medications will be in advance as this is different depending on your insurance coverage. However, we may be able to find a substitute medication at lower cost or fill out paperwork to get insurance to cover a needed medication.   If a prior authorization is required to get your medication covered by your insurance company, please allow [place] 1-2 business days to complete this process.  Drug prices often vary depending on where the prescription is  filled and some pharmacies may offer cheaper prices.  The website www.goodrx.com contains coupons for medications through different pharmacies. The prices here do not account for what the cost may be with help from insurance (it may be cheaper with your insurance), but the website can give you the price if you did not use any insurance.  - You can print the associated coupon and take it with your prescription to the pharmacy.  - You may also stop by our office during regular business hours  and pick up a GoodRx coupon card.  - If you need your prescription sent electronically to a different pharmacy, notify our office through [facility] or by phone at [phone] option 4.     Si Usted Necesita Algo Despus de Su Visita  Tambin puede enviarnos un mensaje a travs de Clinical cytogeneticist. Por lo general respondemos a los mensajes de MyChart en el transcurso de 1 a 2 das hbiles.  Para renovar recetas, por favor pida a su farmacia que se ponga en contacto con nuestra oficina. Annie Sable de fax es [place] [phone].  Si tiene un asunto urgente cuando la clnica est cerrada y que no puede esperar hasta el siguiente da hbil, puede llamar/localizar a su doctor(a) al nmero que aparece a continuacin.   Por favor, tenga en cuenta que aunque hacemos todo lo posible para estar disponibles para asuntos urgentes fuera del horario de [place], no estamos disponibles las 24 horas del da, los 7 [address]  [address] de la [place].   Si tiene un problema urgente y no puede comunicarse con nosotros, puede optar por buscar atencin mdica  en el consultorio de su doctor(a), en una clnica privada, en un centro de atencin urgente o en una sala de emergencias.  Si tiene Engineer, drilling, por favor llame inmediatamente al 911 o vaya a la sala de emergencias.  Nmeros de bper  - Dr. Gwen Pounds: [phone]  - Dra. Roseanne Reno: [phone]  - Dr. Katrinka Blazing: [phone]   En caso de inclemencias del tiempo, por favor llame a Lacy Duverney principal al [phone] para una actualizacin sobre el [place] de cualquier retraso o cierre.  Consejos para la medicacin en dermatologa: Por favor, guarde las cajas en las que vienen los medicamentos de uso tpico para ayudarle a seguir las instrucciones sobre dnde y cmo usarlos. Las farmacias generalmente imprimen las instrucciones del medicamento slo en las cajas y no directamente en los tubos del [place].   Si su medicamento es muy caro, por favor, pngase  en contacto con Rolm Gala llamando al [phone] y presione la opcin 4 o envenos un mensaje a travs de Clinical cytogeneticist.   No podemos decirle cul ser su copago por los medicamentos por adelantado ya que esto es diferente dependiendo de la cobertura de su seguro. Sin embargo, es posible que podamos encontrar un medicamento sustituto a Audiological scientist un formulario para que el seguro cubra el medicamento que se considera necesario.   Si se requiere una autorizacin previa para que su compaa de seguros [place] su medicamento, por favor permtanos de 1 a 2 das hbiles para completar [address]  Los precios de los medicamentos varan con frecuencia dependiendo del Environmental consultant de dnde se surte la receta y alguna farmacias pueden ofrecer precios ms baratos.  El sitio web www.goodrx.com tiene cupones para medicamentos de Health and safety inspector. Los precios aqu no tienen en cuenta lo que podra costar con la ayuda del seguro (puede ser ms barato con su seguro), pero el sitio web puede  darle el precio si no utiliz [employer].  - Puede imprimir el cupn correspondiente y llevarlo con su receta a la farmacia.  - Tambin puede pasar por nuestra oficina durante el horario de atencin regular y Education officer, museum una tarjeta de cupones de GoodRx.  - Si necesita que su receta se enve electrnicamente a una farmacia diferente, informe a nuestra oficina a travs de MyChart de [place] o por telfono llamando al [phone] y presione la opcin 4.

## 2023-02-19 DIAGNOSIS — L700 Acne vulgaris: Secondary | ICD-10-CM | POA: Diagnosis not present

## 2023-02-20 ENCOUNTER — Telehealth: Payer: Self-pay

## 2023-02-20 DIAGNOSIS — Z419 Encounter for procedure for purposes other than remedying health state, unspecified: Secondary | ICD-10-CM | POA: Diagnosis not present

## 2023-02-20 LAB — COMPREHENSIVE METABOLIC PANEL
ALT: 8 [IU]/L (ref 0–32)
AST: 19 [IU]/L (ref 0–40)
Albumin: 4.4 g/dL (ref 4.0–5.0)
Alkaline Phosphatase: 63 [IU]/L (ref 44–121)
BUN/Creatinine Ratio: 10 (ref 9–23)
BUN: 8 mg/dL (ref 6–20)
Bilirubin Total: 0.3 mg/dL (ref 0.0–1.2)
CO2: 23 mmol/L (ref 20–29)
Calcium: 9.4 mg/dL (ref 8.7–10.2)
Chloride: 102 mmol/L (ref 96–106)
Creatinine, Ser: 0.84 mg/dL (ref 0.57–1.00)
Globulin, Total: 2.7 g/dL (ref 1.5–4.5)
Glucose: 86 mg/dL (ref 70–99)
Potassium: 4.5 mmol/L (ref 3.5–5.2)
Sodium: 138 mmol/L (ref 134–144)
Total Protein: 7.1 g/dL (ref 6.0–8.5)
eGFR: 99 mL/min/{1.73_m2} (ref >59)

## 2023-02-20 LAB — LIPID PANEL
Chol/HDL Ratio: 2.7 {ratio} (ref 0.0–4.4)
Cholesterol, Total: 175 mg/dL (ref 100–199)
HDL: 65 mg/dL (ref >39)
LDL Chol Calc (NIH): 101 mg/dL — ABNORMAL HIGH (ref 0–99)
Triglycerides: 45 mg/dL (ref 0–149)
VLDL Cholesterol Cal: 9 mg/dL (ref 5–40)

## 2023-02-20 LAB — HCG, SERUM, QUALITATIVE: hCG,Beta Subunit,Qual,Serum: NEGATIVE m[IU]/mL (ref <6)

## 2023-02-20 MED ORDER — ISOTRETINOIN 20 MG PO CAPS: 20.0000 mg | ORAL_CAPSULE | Freq: Every day | ORAL | 0 refills | Status: DC

## 2023-02-20 NOTE — Telephone Encounter (Signed)
Patient confirmed in iPledge, prescription sent to [facility]. Called patient and advised.

## 2023-03-07 ENCOUNTER — Encounter: Payer: Self-pay | Admitting: Family Medicine

## 2023-03-12 ENCOUNTER — Ambulatory Visit: Payer: Medicaid Other | Admitting: *Deleted

## 2023-03-12 ENCOUNTER — Other Ambulatory Visit (HOSPITAL_COMMUNITY)
Admission: RE | Admit: 2023-03-12 | Discharge: 2023-03-12 | Disposition: A | Discharge status: Home / Self Care | Payer: Medicaid Other | Source: Ambulatory Visit | Attending: Family Medicine | Admitting: Family Medicine

## 2023-03-12 DIAGNOSIS — N898 Other specified noninflammatory disorders of vagina: Secondary | ICD-10-CM | POA: Diagnosis not present

## 2023-03-12 DIAGNOSIS — R300 Dysuria: Secondary | ICD-10-CM | POA: Diagnosis not present

## 2023-03-12 LAB — POCT URINALYSIS DIPSTICK
Blood, UA: NEGATIVE
Leukocytes, UA: NEGATIVE

## 2023-03-12 NOTE — Progress Notes (Signed)
SUBJECTIVE:  22 y.o. female complains of white vaginal discharge for few day(s) and dysuria Denies abnormal vaginal bleeding or significant pelvic pain or fever.Denies history of known exposure to STD.  No LMP recorded.  OBJECTIVE:  She appears alert, well appearing, in no apparent distress Urine dipstick: negative for all components.  ASSESSMENT:  Vaginal Discharge  Dysuria    PLAN:  GC, chlamydia, trichomonas, BVAG, CVAG probe and urine culture sent to lab. Treatment: To be determined once lab results are received ROV prn if symptoms persist or worsen.   Scheryl Marten, RN

## 2023-03-14 ENCOUNTER — Other Ambulatory Visit: Payer: Self-pay | Admitting: Family Medicine

## 2023-03-14 LAB — CERVICOVAGINAL ANCILLARY ONLY
Bacterial Vaginitis (gardnerella): POSITIVE — AB
Candida Glabrata: NEGATIVE
Candida Vaginitis: NEGATIVE
Chlamydia: POSITIVE — AB
Comment: NEGATIVE
Comment: NEGATIVE
Comment: NEGATIVE
Comment: NEGATIVE
Comment: NEGATIVE
Comment: NORMAL
Neisseria Gonorrhea: NEGATIVE
Trichomonas: NEGATIVE

## 2023-03-14 MED ORDER — AZITHROMYCIN 500 MG PO TABS: 1000.0000 mg | ORAL_TABLET | Freq: Once | ORAL | 1 refills | Status: AC

## 2023-03-14 MED ORDER — METRONIDAZOLE 500 MG PO TABS: 500.0000 mg | ORAL_TABLET | Freq: Two times a day (BID) | ORAL | 0 refills | Status: AC

## 2023-03-15 ENCOUNTER — Ambulatory Visit: Payer: Medicaid Other

## 2023-03-15 ENCOUNTER — Other Ambulatory Visit (HOSPITAL_COMMUNITY)
Admission: RE | Admit: 2023-03-15 | Discharge: 2023-03-15 | Disposition: A | Discharge status: Home / Self Care | Payer: Medicaid Other | Source: Ambulatory Visit | Attending: Obstetrics and Gynecology | Admitting: Obstetrics and Gynecology

## 2023-03-15 ENCOUNTER — Telehealth: Payer: Self-pay

## 2023-03-15 DIAGNOSIS — N898 Other specified noninflammatory disorders of vagina: Secondary | ICD-10-CM

## 2023-03-15 NOTE — Progress Notes (Signed)
SUBJECTIVE:  22 y.o. female who desires a STI screen. Denies abnormal vaginal discharge, bleeding or significant pelvic pain. No UTI symptoms. Denies history of known exposure to STD. Pt stating that she is having vaginal discharge and is wanting to be retested as previous vaginal swab was + for Chlamydia   No LMP recorded.  OBJECTIVE:  She appears well.   ASSESSMENT:  STI Screen   PLAN:  Pt offered STI blood screening-not indicated GC, chlamydia, and trichomonas probe sent to lab.  Treatment: To be determined once lab results are received.  Pt follow up as needed.

## 2023-03-15 NOTE — Telephone Encounter (Signed)
Pt called regarding results and stating that her partner stated that he was negative, Pt curious on how this could happen. I advised Pt to verify that her partners results are negative by asking him for the results, also I advised patient that partner could have already been tested and treated.  Pt requesting to come in and be re swabbed as patient is thinking that the results are a false positive. Pt is advised that insurance may not cover the test. Pt stating that she is having symptoms and is wanting to be swabbed again to rule out the possibility of a false positive. I advised the patient that the possibility of a false positive is highly unlikely.  Pt also has not started taken the medication that was sent in yesterday.    Pt placed on schedule as a nurse visit Per patients Request

## 2023-03-16 LAB — URINE CULTURE

## 2023-03-17 ENCOUNTER — Other Ambulatory Visit: Payer: Self-pay | Admitting: Family Medicine

## 2023-03-17 MED ORDER — NITROFURANTOIN MONOHYD MACRO 100 MG PO CAPS: 100.0000 mg | ORAL_CAPSULE | Freq: Two times a day (BID) | ORAL | 0 refills | Status: AC

## 2023-03-19 ENCOUNTER — Ambulatory Visit
Admission: RE | Admit: 2023-03-19 | Discharge: 2023-03-19 | Disposition: A | Discharge status: Home / Self Care | Payer: Medicaid Other | Source: Ambulatory Visit | Attending: Emergency Medicine | Admitting: Emergency Medicine

## 2023-03-19 VITALS — BP 119/81 | HR 76 | Temp 98.9°F | Resp 16

## 2023-03-19 DIAGNOSIS — B9789 Other viral agents as the cause of diseases classified elsewhere: Secondary | ICD-10-CM | POA: Insufficient documentation

## 2023-03-19 DIAGNOSIS — Z1152 Encounter for screening for COVID-19: Secondary | ICD-10-CM | POA: Diagnosis not present

## 2023-03-19 DIAGNOSIS — J069 Acute upper respiratory infection, unspecified: Secondary | ICD-10-CM | POA: Insufficient documentation

## 2023-03-19 LAB — CERVICOVAGINAL ANCILLARY ONLY
Bacterial Vaginitis (gardnerella): POSITIVE — AB
Candida Glabrata: NEGATIVE
Candida Vaginitis: NEGATIVE
Chlamydia: NEGATIVE
Comment: NEGATIVE
Comment: NORMAL
Comment: NEGATIVE
Comment: NEGATIVE
Comment: NEGATIVE
Comment: NEGATIVE
Neisseria Gonorrhea: NEGATIVE
Trichomonas: NEGATIVE

## 2023-03-19 LAB — POCT RAPID STREP A (OFFICE): Rapid Strep A Screen: NEGATIVE

## 2023-03-19 NOTE — ED Provider Notes (Signed)
[facility]    CSN: [id number] Arrival date & time: 03/19/23  1402      History   Chief Complaint Chief Complaint  Patient presents with   Sore Throat    Sore throat, head hurts, sneezing, runny nose - Entered by patient    HPI Sharon Steele is a 22 y.o. female.   Patient presents with concerns of feeling unwell since yesterday afternoon. She reports fatigue, headache, sore throat, mild rhinorrhea, and having hot/cold spells. She denies measured fever, cough, n/v/d, or ear pain. She has not taken anything for her symptoms. She denies known sick contacts.   The history is provided by the patient.  Sore Throat Associated symptoms include headaches. Pertinent negatives include no shortness of breath.    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 No pertinent surgical history.  OB History     Gravida  1   Para  0   Term  0   Preterm  0   AB  1   Living  0      SAB  1   IAB  0   Ectopic  0   Multiple  0   Live Births  0            Home Medications    Prior to Admission medications   Medication Sig Start Date End Date Taking? Authorizing Provider  norgestimate-ethinyl estradiol (ORTHO-CYCLEN) 0.25-35 MG-MCG tablet Take 1 tablet by mouth daily. 11/22/22  Yes Bowbells Bing, MD  doxycycline (MONODOX) 100 MG capsule Take 1 capsule (100 mg total) by mouth 2 (two) times daily. Take with food 12/26/22   Elie Goody, MD  ISOtretinoin (ABSORICA) 20 MG capsule Take 1 capsule (20 mg total) by mouth daily. Take with a fatty meal 02/20/23   Elie Goody, MD  metroNIDAZOLE (FLAGYL) 500 MG tablet Take 1 tablet (500 mg total) by mouth 2 (two) times daily for 7 days.  03/14/23 03/21/23  [name], MD  mupirocin ointment (BACTROBAN) 2 % Apply 1 Application topically daily. Patient not taking: Reported on 12/26/2022 10/16/22   Raspet, Noberto Retort, PA-C  nitrofurantoin, macrocrystal-monohydrate, (MACROBID) 100 MG capsule Take 1 capsule (100 mg total) by mouth 2 (two) times daily for 7 days. 03/17/23 03/24/23  [name], MD  spironolactone (ALDACTONE) 50 MG tablet TAKE 1 TO 2 TABLETS BY MOUTH DAILY. 01/18/23   Elie Goody, MD  valACYclovir (VALTREX) 500 MG tablet For episodes: 1 tab po bid x 3d. For daily suppression: 1 tab po qday Patient not taking: Reported on 12/26/2022 11/22/22   Milton Bing, MD  cetirizine (ZYRTEC) 10 MG tablet Take 1 tablet (10 mg total) by mouth daily. For 3 days 09/06/16 11/24/19  Ree Shay, MD  ipratropium (ATROVENT) 0.06 % nasal spray Place 2 sprays into both nostrils 4 (four) times daily. Patient not taking: Reported on 09/06/2016 07/04/16 11/24/19  Deatra Canter, FNP    Family History Family History  Problem Relation Age of Onset   Cancer Mother    Hypertension Father    Hypertension Paternal Grandfather    Hypertension Paternal Grandmother    Diabetes Maternal Grandmother    Diabetes Maternal Grandfather    Hypertension  Paternal Aunt    Multiple sclerosis Paternal Aunt     Social History Social History   Tobacco Use   Smoking status: Never   Smokeless tobacco: Never  Vaping Use   Vaping status: Never Used  Substance Use Topics   Alcohol use: Yes   Drug use: Not Currently     Allergies   Cashew nut oil, Almond oil, and Bactrim [sulfamethoxazole-trimethoprim]   Review of Systems Review of Systems  Constitutional:  Positive for chills, diaphoresis and fatigue.  HENT:  Positive for rhinorrhea and sore throat. Negative for congestion and ear pain.   Respiratory:  Negative for cough and shortness of breath.   Gastrointestinal:  Negative for diarrhea, nausea and vomiting.  Musculoskeletal:  Negative for  myalgias.  Skin:  Negative for rash.  Neurological:  Positive for headaches. Negative for dizziness.     Physical Exam Triage Vital Signs ED Triage Vitals  Encounter Vitals Group     BP 03/19/23 1418 119/81     Systolic BP Percentile --      Diastolic BP Percentile --      Pulse Rate 03/19/23 1418 76     Resp 03/19/23 1418 16     Temp 03/19/23 1418 98.9 F (37.2 C)     Temp Source 03/19/23 1418 Oral     SpO2 03/19/23 1418 97 %     Weight --      Height --      Head Circumference --      Peak Flow --      Pain Score 03/19/23 1419 2     Pain Loc --      Pain Education --      Exclude from Growth Chart --    No data found.  Updated Vital Signs BP 119/81 (BP Location: Right Arm)   Pulse 76   Temp 98.9 F (37.2 C) (Oral)   Resp 16   LMP 03/16/2023 (Exact Date)   SpO2 97%   Visual Acuity Right Eye Distance:   Left Eye Distance:   Bilateral Distance:    Right Eye Near:   Left Eye Near:    Bilateral Near:     Physical Exam Vitals and nursing note reviewed.  Constitutional:      General: She is not in acute distress. HENT:     Head: Normocephalic.     Nose: Nose normal.     Mouth/Throat:     Mouth: Mucous membranes are moist.     Pharynx: Oropharynx is clear. Posterior oropharyngeal erythema present. No oropharyngeal exudate.  Eyes:     Conjunctiva/sclera: Conjunctivae normal.     Pupils: Pupils are equal, round, and reactive to light.  Cardiovascular:     Rate and Rhythm: Normal rate and regular rhythm.     Heart sounds: Normal heart sounds.  Pulmonary:     Effort: Pulmonary effort is normal.     Breath sounds: Normal breath sounds.  Musculoskeletal:     Cervical back: Normal range of motion.  Lymphadenopathy:     Cervical: Cervical adenopathy present.  Skin:    Findings: No rash.  Neurological:     Mental Status: She is alert.  Psychiatric:        Mood and Affect: Mood normal.      UC Treatments / Results  Labs (all labs ordered are listed,  but only abnormal results are displayed) Labs Reviewed  SARS CORONAVIRUS 2 (TAT 6-24 HRS)  POCT RAPID STREP A (OFFICE)  EKG   Radiology No results found.  Procedures Procedures (including critical care time)  Medications Ordered in UC Medications - No data to display  Initial Impression / Assessment and Plan / UC Course  I have reviewed the triage vital signs and the nursing notes.  Pertinent labs & imaging results that were available during my care of the patient were reviewed by me and considered in my medical decision making (see chart for details).     Rapid strep negative. COVID test sent out. Sx tx and reassurance. Discussed return precautions.   E/M: 1 acute uncomplicated illness, 2 data, low risk   Final Clinical Impressions(s) / UC Diagnoses   Final diagnoses:  Viral URI     Discharge Instructions      Strep test negative. We are sending out COVID test and will contact you with results. Rest, keep hydrated, and take OTC medicine as needed for symptoms. Return to care if no improvement in a week. Go to the ER if develop difficulty breathing or inability to swallow.      ED Prescriptions   None    PDMP not reviewed this encounter.   Estanislado Pandy, [place] 03/19/23 1434

## 2023-03-19 NOTE — Discharge Instructions (Signed)
Strep test negative. We are sending out COVID test and will contact you with results. Rest, keep hydrated, and take OTC medicine as needed for symptoms. Return to care if no improvement in a week. Go to the ER if develop difficulty breathing or inability to swallow.

## 2023-03-19 NOTE — ED Triage Notes (Signed)
Pt presents to UC w/ c/o sore throat, headache, eye pain since yesterday.

## 2023-03-20 ENCOUNTER — Encounter: Payer: Self-pay | Admitting: Dermatology

## 2023-03-20 ENCOUNTER — Ambulatory Visit (INDEPENDENT_AMBULATORY_CARE_PROVIDER_SITE_OTHER): Payer: Medicaid Other | Admitting: Dermatology

## 2023-03-20 VITALS — BP 123/84 | HR 83 | Wt 150.4 lb

## 2023-03-20 DIAGNOSIS — Z79899 Other long term (current) drug therapy: Secondary | ICD-10-CM

## 2023-03-20 DIAGNOSIS — K130 Diseases of lips: Secondary | ICD-10-CM

## 2023-03-20 DIAGNOSIS — L732 Hidradenitis suppurativa: Secondary | ICD-10-CM | POA: Diagnosis not present

## 2023-03-20 DIAGNOSIS — L853 Xerosis cutis: Secondary | ICD-10-CM

## 2023-03-20 DIAGNOSIS — L700 Acne vulgaris: Secondary | ICD-10-CM

## 2023-03-20 DIAGNOSIS — Z7189 Other specified counseling: Secondary | ICD-10-CM

## 2023-03-20 LAB — SARS CORONAVIRUS 2 (TAT 6-24 HRS): SARS Coronavirus 2: NEGATIVE

## 2023-03-20 MED ORDER — ISOTRETINOIN 40 MG PO CAPS: 40.0000 mg | ORAL_CAPSULE | Freq: Every day | ORAL | 0 refills | Status: DC

## 2023-03-20 NOTE — Progress Notes (Signed)
Isotretinoin Follow-Up Visit   Subjective  Sharon Steele is a 22 y.o. female who presents for the following: Isotretinoin follow-up. Acne on face and HS inframammary. Taking 20 mg daily. Tolerating well. C/O dry lips.   Week # 4   Isotretinoin F/U - 03/20/23 1500       Isotretinoin Follow Up   iPledge # [id number]    Date 03/20/23    Weight 150 lb 6.4 oz (68.2 kg)    Two Forms of Birth Control Oral Contraceptives (w/ estrogen);Female Condom    Acne breakouts since last visit? Yes      Dosage   Target Dosage (mg) 15004    Current (To Date) Dosage (mg) 600    To Go Dosage (mg) 14404      Skin Side Effects   Dry Lips Yes    Nose bleeds No    Dry eyes No    Dry Skin No    Sunburn No      Gastrointestinal Side Effects   Nausea No    Diarrhea No    Blood in stool No      Neurological Side Effects   Blurred vision No    Depression No    Headache No    Homicidal thoughts No    Mood Changes No    Suicidal thoughts No      Constitutional Side Effects   Fatigue No      Musculoskeletal Side Effects   Muscle aches No              Side effects: Dry skin, dry lips  Patient is not pregnant, not seeking pregnancy, and not breastfeeding.   The following portions of the chart were reviewed this encounter and updated as appropriate: medications, allergies, medical history  Review of Systems:  No other skin or systemic complaints except as noted in HPI or Assessment and Plan.  Objective  Well appearing patient in no apparent distress; mood and affect are within normal limits.  An examination of the face, neck, chest, and back was performed and relevant findings are noted below.     Assessment & Plan     ACNE VULGARIS Patient is currently on Isotretinoin requiring FDA mandated monthly evaluations and laboratory monitoring. Condition is currently not to goal (must reach target dose based on weight and also have clear skin for 2 months prior to discontinuation  in order to help prevent relapse)  Exam findings: Numerous scattered open comedones and inflammatory papules and pustules on face. Scattered PIH   Week # 4 Pharmacy [facility] iPLEDGE #  [id number]  Total mg -  600 Total mg/kg - 8.79 mg/kg Birth Control- oral birth control, female latex condoms   Increase to isotretinoin from 20 mg to 40 mg daily Cont Spironolactone 50mg  1 po qd   Plan labs at next visit.   Urine pregnancy test performed in office today and was negative.  Patient demonstrates comprehension and confirms she will not get pregnant.   Patient confirmed in iPledge and isotretinoin sent to pharmacy.   Spironolactone can cause increased urination and cause blood pressure to decrease. Please watch for signs of lightheadedness and be cautious when changing position. It can sometimes cause breast tenderness or an irregular period in premenopausal women. It can also increase potassium. The increase in potassium usually is not a concern unless you are taking other medicines that also increase potassium, so please be sure your doctor knows all of the other medications you are  taking. This medication should not be taken by pregnant women.  This medicine should also not be taken together with sulfa drugs like Bactrim (trimethoprim/sulfamethexazole).    Xerosis secondary to isotretinoin therapy - Continue emollients as directed - Xyzal (levocetirizine) once a day and fish oil 1 gram daily may also help with dryness   Cheilitis secondary to isotretinoin therapy - Continue lip balm as directed, Dr. Clayborne Artist Cortibalm recommended   Long term medication management (isotretinoin)  Patient is using long term (months to years) prescription medication  to control their dermatologic condition.  These medications require periodic monitoring to evaluate for efficacy and side effects and may require periodic laboratory monitoring.  - While taking Isotretinoin and for 30 days after you finish  the medication, do not get pregnant, do not share pills, do not donate blood. Isotretinoin is best absorbed when taken with a fatty meal. Isotretinoin can make you sensitive to the sun. Daily careful sun protection including sunscreen SPF 30+ when outdoors is recommended.   HIDRADENITIS SUPPURATIVA Bil inframammary Exam: not examined today   Chronic and persistent condition with duration or expected duration over one year. Condition is symptomatic / bothersome to patient. Not to goal.     Hidradenitis Suppurativa is a chronic; persistent; non-curable, but treatable condition due to abnormal inflamed sweat glands in the body folds (axilla, inframammary, groin, medial thighs), causing recurrent painful draining cysts and scarring. It can be associated with severe scarring acne and cysts; also abscesses and scarring of scalp. The goal is control and prevention of flares, as it is not curable. Scars are permanent and can be thickened. Treatment may include daily use of topical medication and oral antibiotics.  Oral isotretinoin may also be helpful.  For some cases, Humira or Cosentyx (biologic injections) may be prescribed to decrease the inflammatory process and prevent flares.  When indicated, inflamed cysts may also be treated surgically.   Treatment Plan: Increase to isotretinoin 40 mg daily Cont Spironolactone 50mg  1 po qd     Follow-up in 30 days.  I, Lawson Radar, CMA, am acting as scribe for Elie Goody, MD.   Documentation: I have reviewed the above documentation for accuracy and completeness, and I agree with the above.  Elie Goody, MD

## 2023-03-20 NOTE — Patient Instructions (Addendum)
Increase to isotretinoin 40 mg daily Cont Spironolactone 50mg  1 po every day  Your prescription was sent to [facility] in [place]. A representative from [facility] will contact you within 3 business hours to verify your address and insurance information to schedule a free delivery. If for any reason you do not receive a phone call from them, please reach out to them. Their phone number is [phone] and their hours are Monday-Friday 9:00 am-5:00 pm.    While taking Isotretinoin and for 30 days after you finish the medication, do not get pregnant, do not share pills, do not donate blood. Isotretinoin is best absorbed when taken with a fatty meal. Isotretinoin can make you sensitive to the sun. Daily careful sun protection including sunscreen SPF 30+ when outdoors is recommended.     Due to recent changes in healthcare laws, you may see results of your pathology and/or laboratory studies on MyChart before the doctors have had a chance to review them. We understand that in some cases there may be results that are confusing or concerning to you. Please understand that not all results are received at the same time and often the doctors may need to interpret multiple results in order to provide you with the best plan of care or course of treatment. Therefore, we ask that you please give [place] 2 business days to thoroughly review all your results before contacting the office for clarification. Should we see a critical lab result, you will be contacted sooner.   If You Need Anything After Your Visit  If you have any questions or concerns for your doctor, please call our main line at [phone] and press option 4 to reach your doctor's medical assistant. If no one answers, please leave a voicemail as directed and we will return your call as soon as possible. Messages left after 4 pm will be answered the following business day.   You may also send [place] a message via MyChart. We typically respond  to MyChart messages within 1-2 business days.  For prescription refills, please ask your pharmacy to contact our office. Our fax number is [phone].  If you have an urgent issue when the clinic is closed that cannot wait until the next business day, you can page your doctor at the number below.    Please note that while we do our best to be available for urgent issues outside of office hours, we are not available 24/7.   If you have an urgent issue and are unable to reach [place], you may choose to seek medical care at your doctor's office, retail clinic, urgent care center, or emergency room.  If you have a medical emergency, please immediately call 911 or go to the emergency department.  Pager Numbers  - Dr. Gwen Pounds: [phone]  - Dr. Roseanne Reno: [phone]  - Dr. Katrinka Blazing: [phone]   In the event of inclement weather, please call our main line at [phone] for an update on the status of any delays or closures.  Dermatology Medication Tips: Please keep the boxes that topical medications come in in order to help keep track of the instructions about where and how to use these. Pharmacies typically print the medication instructions only on the boxes and not directly on the medication tubes.   If your medication is too expensive, please contact our office at [phone] option 4 or send [place] a message through MyChart.   We are unable to tell what your co-pay for medications will be in advance as  this is different depending on your insurance coverage. However, we may be able to find a substitute medication at lower cost or fill out paperwork to get insurance to cover a needed medication.   If a prior authorization is required to get your medication covered by your insurance company, please allow [place] 1-2 business days to complete this process.  Drug prices often vary depending on where the prescription is filled and some pharmacies may offer cheaper prices.  The website www.goodrx.com  contains coupons for medications through different pharmacies. The prices here do not account for what the cost may be with help from insurance (it may be cheaper with your insurance), but the website can give you the price if you did not use any insurance.  - You can print the associated coupon and take it with your prescription to the pharmacy.  - You may also stop by our office during regular business hours and pick up a GoodRx coupon card.  - If you need your prescription sent electronically to a different pharmacy, notify our office through [facility] or by phone at [phone] option 4.     Si Usted Necesita Algo Despus de Su Visita  Tambin puede enviarnos un mensaje a travs de Clinical cytogeneticist. Por lo general respondemos a los mensajes de MyChart en el transcurso de 1 a 2 das hbiles.  Para renovar recetas, por favor pida a su farmacia que se ponga en contacto con nuestra oficina. Annie Sable de fax es [place] [phone].  Si tiene un asunto urgente cuando la clnica est cerrada y que no puede esperar hasta el siguiente da hbil, puede llamar/localizar a su doctor(a) al nmero que aparece a continuacin.   Por favor, tenga en cuenta que aunque hacemos todo lo posible para estar disponibles para asuntos urgentes fuera del horario de [place], no estamos disponibles las 24 horas del da, los 7 [address]  [address] de la [place].   Si tiene un problema urgente y no puede comunicarse con nosotros, puede optar por buscar atencin mdica  en el consultorio de su doctor(a), en una clnica privada, en un centro de atencin urgente o en una sala de emergencias.  Si tiene Engineer, drilling, por favor llame inmediatamente al 911 o vaya a la sala de emergencias.  Nmeros de bper  - Dr. Gwen Pounds: [phone]  - Dra. Roseanne Reno: [phone]  - Dr. Katrinka Blazing: [phone]   En caso de inclemencias del tiempo, por favor llame a Lacy Duverney principal al [phone] para una actualizacin sobre el [place]  de cualquier retraso o cierre.  Consejos para la medicacin en dermatologa: Por favor, guarde las cajas en las que vienen los medicamentos de uso tpico para ayudarle a seguir las instrucciones sobre dnde y cmo usarlos. Las farmacias generalmente imprimen las instrucciones del medicamento slo en las cajas y no directamente en los tubos del [place].   Si su medicamento es muy caro, por favor, pngase en contacto con Rolm Gala llamando al [phone] y presione la opcin 4 o envenos un mensaje a travs de Clinical cytogeneticist.   No podemos decirle cul ser su copago por los medicamentos por adelantado ya que esto es diferente dependiendo de la cobertura de su seguro. Sin embargo, es posible que podamos encontrar un medicamento sustituto a Audiological scientist un formulario para que el seguro cubra el medicamento que se considera necesario.   Si se requiere una autorizacin previa para que su compaa de seguros [place] su medicamento, por favor permtanos de 1 a 2 [address]  [address]  hbiles para completar este proceso.  Los precios de los medicamentos varan con frecuencia dependiendo del Environmental consultant de dnde se surte la receta y alguna farmacias pueden ofrecer precios ms baratos.  El sitio web www.goodrx.com tiene cupones para medicamentos de Health and safety inspector. Los precios aqu no tienen en cuenta lo que podra costar con la ayuda del seguro (puede ser ms barato con su seguro), pero el sitio web puede darle el precio si no utiliz Tourist information centre manager.  - Puede imprimir el cupn correspondiente y llevarlo con su receta a la farmacia.  - Tambin puede pasar por nuestra oficina durante el horario de atencin regular y Education officer, museum una tarjeta de cupones de GoodRx.  - Si necesita que su receta se enve electrnicamente a una farmacia diferente, informe a nuestra oficina a travs de MyChart de [place] o por telfono llamando al [phone] y presione la opcin 4.

## 2023-03-23 DIAGNOSIS — Z419 Encounter for procedure for purposes other than remedying health state, unspecified: Secondary | ICD-10-CM | POA: Diagnosis not present

## 2023-03-25 ENCOUNTER — Encounter: Payer: Self-pay | Admitting: Dermatology

## 2023-03-26 ENCOUNTER — Other Ambulatory Visit: Payer: Self-pay

## 2023-03-26 DIAGNOSIS — Z79899 Other long term (current) drug therapy: Secondary | ICD-10-CM

## 2023-03-26 DIAGNOSIS — Z7189 Other specified counseling: Secondary | ICD-10-CM

## 2023-03-26 DIAGNOSIS — L700 Acne vulgaris: Secondary | ICD-10-CM

## 2023-03-26 MED ORDER — ISOTRETINOIN 40 MG PO CAPS: 40.0000 mg | ORAL_CAPSULE | Freq: Every day | ORAL | 0 refills | Status: DC

## 2023-03-26 NOTE — Progress Notes (Signed)
Transfer to local pharmacy. aw

## 2023-03-28 ENCOUNTER — Telehealth: Payer: Self-pay

## 2023-03-28 NOTE — Telephone Encounter (Signed)
Patient was unable to get prescription from [facility] within 7 day window. Patient is going to send digital test through MyChart to resend and re confirm in ipledge. aw

## 2023-04-02 ENCOUNTER — Encounter: Payer: Self-pay | Admitting: Dermatology

## 2023-04-03 ENCOUNTER — Other Ambulatory Visit: Payer: Self-pay

## 2023-04-03 DIAGNOSIS — L700 Acne vulgaris: Secondary | ICD-10-CM

## 2023-04-03 DIAGNOSIS — Z7189 Other specified counseling: Secondary | ICD-10-CM

## 2023-04-03 DIAGNOSIS — Z79899 Other long term (current) drug therapy: Secondary | ICD-10-CM

## 2023-04-03 MED ORDER — ISOTRETINOIN 40 MG PO CAPS: 40.0000 mg | ORAL_CAPSULE | Freq: Every day | ORAL | 0 refills | Status: AC

## 2023-04-03 NOTE — Progress Notes (Signed)
Resent accutane due to patient not picking up last week. aw

## 2023-04-16 ENCOUNTER — Ambulatory Visit: Payer: Medicaid Other | Admitting: Dermatology

## 2023-04-17 ENCOUNTER — Ambulatory Visit: Payer: Medicaid Other | Admitting: Dermatology

## 2023-04-22 DIAGNOSIS — Z419 Encounter for procedure for purposes other than remedying health state, unspecified: Secondary | ICD-10-CM | POA: Diagnosis not present

## 2023-05-09 ENCOUNTER — Ambulatory Visit: Payer: Medicaid Other | Admitting: Dermatology

## 2023-05-09 ENCOUNTER — Encounter: Payer: Self-pay | Admitting: Dermatology

## 2023-05-09 VITALS — Wt 150.4 lb

## 2023-05-09 DIAGNOSIS — L700 Acne vulgaris: Secondary | ICD-10-CM | POA: Diagnosis not present

## 2023-05-09 DIAGNOSIS — Z79899 Other long term (current) drug therapy: Secondary | ICD-10-CM | POA: Diagnosis not present

## 2023-05-09 DIAGNOSIS — Z7189 Other specified counseling: Secondary | ICD-10-CM

## 2023-05-09 MED ORDER — ISOTRETINOIN 40 MG PO CAPS: 40.0000 mg | ORAL_CAPSULE | Freq: Every day | ORAL | 0 refills | Status: AC

## 2023-05-09 NOTE — Progress Notes (Signed)
Isotretinoin Follow-Up Visit   Subjective  Sharon Steele is a 22 y.o. female who presents for the following: Isotretinoin follow-up. Patient also taking spironolactone 50 mg daily.   Week # 8   Isotretinoin F/U - 05/09/23 1600       Isotretinoin Follow Up   iPledge # [id number]    Date 05/09/23    Weight 150 lb 6.4 oz (68.2 kg)    Two Forms of Birth Control Oral Contraceptives (w/ estrogen);Female Condom    Acne breakouts since last visit? Yes      Dosage   Target Dosage (mg) 15004    Current (To Date) Dosage (mg) 1800    To Go Dosage (mg) 13204      Skin Side Effects   Dry Lips Yes    Nose bleeds No    Dry eyes No    Dry Skin No    Sunburn No      Gastrointestinal Side Effects   Nausea No    Diarrhea No    Blood in stool No      Neurological Side Effects   Blurred vision No    Depression No    Headache No    Homicidal thoughts No    Mood Changes No    Suicidal thoughts No      Constitutional Side Effects   Fatigue No      Musculoskeletal Side Effects   Muscle aches No              Side effects: Dry skin, dry lips  Patient is not pregnant, not seeking pregnancy, and not breastfeeding.   The following portions of the chart were reviewed this encounter and updated as appropriate: medications, allergies, medical history  Review of Systems:  No other skin or systemic complaints except as noted in HPI or Assessment and Plan.  Objective  Well appearing patient in no apparent distress; mood and affect are within normal limits.  An examination of the face, neck, chest, and back was performed and relevant findings are noted below.     Assessment & Plan   ACNE VULGARIS   Related Medications ISOtretinoin (ABSORICA) 40 MG capsule Take 1 capsule (40 mg total) by mouth daily. ISOtretinoin (ABSORICA) 40 MG capsule Take 1 capsule (40 mg total) by mouth daily. LONG-TERM USE OF HIGH-RISK MEDICATION   Related Medications ISOtretinoin (ABSORICA) 40  MG capsule Take 1 capsule (40 mg total) by mouth daily. ISOtretinoin (ABSORICA) 40 MG capsule Take 1 capsule (40 mg total) by mouth daily.  ACNE VULGARIS, severe and chronic, flaring.  Patient is currently on Isotretinoin requiring FDA mandated monthly evaluations and laboratory monitoring. Condition is currently not to goal (must reach target dose based on weight and also have clear skin for 2 months prior to discontinuation in order to help prevent relapse)  Exam findings: Scattered inflammatory papules, pustules, comedones, scattered PIH, on face and trunk. Pitted scars on cheeks  Week # 8 Pharmacy [facility] iPLEDGE #  [id number]  Total mg -  1,800 Total mg/kg - 26.39 mg/kg Birth Control- oral birth control, female latex condoms   Continue isotretinoin 40 mg every day (pt prefers to stay at current dose this month) Continue spironolactone 50 mg every day   Urine pregnancy test performed in office today and was negative.  Patient demonstrates comprehension and confirms she will not get pregnant.   Patient confirmed in iPledge and isotretinoin sent to pharmacy.   Isotretinoin Counseling; Review and Contraception Counseling: Reviewed potential  side effects of isotretinoin including xerosis, cheilitis, hepatitis, hyperlipidemia, and severe birth defects if taken by a pregnant woman.  Women on isotretinoin must be celibate (not having sex) or required to use at least 2 birth control methods to prevent pregnancy (unless patient is a female of non-child bearing potential).  Females of child-bearing potential must have monthly pregnancy tests while on isotretinoin and report through I-Pledge (FDA monitoring program). Reviewed reports of suicidal ideation in those with a history of depression while taking isotretinoin and reports of diagnosis of inflammatory bowl disease (IBD) while taking isotretinoin as well as the lack of evidence for a causal relationship between isotretinoin,  depression and IBD. Patient advised to reach out with any questions or concerns. Patient advised not to share pills or donate blood while on treatment or for one month after completing treatment. All patient's considering Isotretinoin must read and understand and sign Isotretinoin Consent Form and be registered with I-Pledge.  Xerosis secondary to isotretinoin therapy - Continue emollients as directed - Xyzal (levocetirizine) once a day and fish oil 1 gram daily may also help with dryness   Cheilitis secondary to isotretinoin therapy - Continue lip balm as directed, Dr. Clayborne Artist Cortibalm recommended   Long term medication management (isotretinoin).  Patient is using long term (months to years) prescription medication  to control their dermatologic condition.  These medications require periodic monitoring to evaluate for efficacy and side effects and may require periodic laboratory monitoring.  - While taking Isotretinoin and for 30 days after you finish the medication, do not get pregnant, do not share pills, do not donate blood. Isotretinoin is best absorbed when taken with a fatty meal. Isotretinoin can make you sensitive to the sun. Daily careful sun protection including sunscreen SPF 30+ when outdoors is recommended.  Follow-up in 30 days.  Anise Salvo, RMA, am acting as scribe for Elie Goody, MD .   Documentation: I have reviewed the above documentation for accuracy and completeness, and I agree with the above.  Elie Goody, MD

## 2023-05-09 NOTE — Patient Instructions (Signed)
Spironolactone can cause increased urination and cause blood pressure to decrease. Please watch for signs of lightheadedness and be cautious when changing position. It can sometimes cause breast tenderness or an irregular period in premenopausal women. It can also increase potassium. The increase in potassium usually is not a concern unless you are taking other medicines that also increase potassium, so please be sure your doctor knows all of the other medications you are taking. This medication should not be taken by pregnant women.  This medicine should also not be taken together with sulfa drugs like Bactrim (trimethoprim/sulfamethexazole).   While taking Isotretinoin and for 30 days after you finish the medication, do not get pregnant, do not share pills, do not donate blood.  Generic isotretinoin is best absorbed when taken with a fatty meal. Isotretinoin can make you sensitive to the sun. Daily careful sun protection including sunscreen SPF 30+ when outdoors is recommended.  Due to recent changes in healthcare laws, you may see results of your pathology and/or laboratory studies on MyChart before the doctors have had a chance to review them. We understand that in some cases there may be results that are confusing or concerning to you. Please understand that not all results are received at the same time and often the doctors may need to interpret multiple results in order to provide you with the best plan of care or course of treatment. Therefore, we ask that you please give [place] 2 business days to thoroughly review all your results before contacting the office for clarification. Should we see a critical lab result, you will be contacted sooner.   If You Need Anything After Your Visit  If you have any questions or concerns for your doctor, please call our main line at [phone] and press option 4 to reach your doctor's medical assistant. If no one answers, please leave a voicemail as directed and we  will return your call as soon as possible. Messages left after 4 pm will be answered the following business day.   You may also send [place] a message via MyChart. We typically respond to MyChart messages within 1-2 business days.  For prescription refills, please ask your pharmacy to contact our office. Our fax number is [phone].  If you have an urgent issue when the clinic is closed that cannot wait until the next business day, you can page your doctor at the number below.    Please note that while we do our best to be available for urgent issues outside of office hours, we are not available 24/7.   If you have an urgent issue and are unable to reach [place], you may choose to seek medical care at your doctor's office, retail clinic, urgent care center, or emergency room.  If you have a medical emergency, please immediately call 911 or go to the emergency department.  Pager Numbers  - Dr. Gwen Pounds: [phone]  - Dr. Roseanne Reno: [phone]  - Dr. Katrinka Blazing: [phone]   In the event of inclement weather, please call our main line at [phone] for an update on the status of any delays or closures.  Dermatology Medication Tips: Please keep the boxes that topical medications come in in order to help keep track of the instructions about where and how to use these. Pharmacies typically print the medication instructions only on the boxes and not directly on the medication tubes.   If your medication is too expensive, please contact our office at [phone] option 4 or send [place] a message  through MyChart.   We are unable to tell what your co-pay for medications will be in advance as this is different depending on your insurance coverage. However, we may be able to find a substitute medication at lower cost or fill out paperwork to get insurance to cover a needed medication.   If a prior authorization is required to get your medication covered by your insurance company, please allow [place] 1-2  business days to complete this process.  Drug prices often vary depending on where the prescription is filled and some pharmacies may offer cheaper prices.  The website www.goodrx.com contains coupons for medications through different pharmacies. The prices here do not account for what the cost may be with help from insurance (it may be cheaper with your insurance), but the website can give you the price if you did not use any insurance.  - You can print the associated coupon and take it with your prescription to the pharmacy.  - You may also stop by our office during regular business hours and pick up a GoodRx coupon card.  - If you need your prescription sent electronically to a different pharmacy, notify our office through [facility] or by phone at [phone] option 4.     Si Usted Necesita Algo Despus de Su Visita  Tambin puede enviarnos un mensaje a travs de Clinical cytogeneticist. Por lo general respondemos a los mensajes de MyChart en el transcurso de 1 a 2 das hbiles.  Para renovar recetas, por favor pida a su farmacia que se ponga en contacto con nuestra oficina. Annie Sable de fax es [place] [phone].  Si tiene un asunto urgente cuando la clnica est cerrada y que no puede esperar hasta el siguiente da hbil, puede llamar/localizar a su doctor(a) al nmero que aparece a continuacin.   Por favor, tenga en cuenta que aunque hacemos todo lo posible para estar disponibles para asuntos urgentes fuera del horario de [place], no estamos disponibles las 24 horas del da, los 7 [address]  [address] de la [place].   Si tiene un problema urgente y no puede comunicarse con nosotros, puede optar por buscar atencin mdica  en el consultorio de su doctor(a), en una clnica privada, en un centro de atencin urgente o en una sala de emergencias.  Si tiene Engineer, drilling, por favor llame inmediatamente al 911 o vaya a la sala de emergencias.  Nmeros de bper  - Dr. Gwen Pounds: [phone]  - Dra.  Roseanne Reno: [phone]  - Dr. Katrinka Blazing: [phone]   En caso de inclemencias del tiempo, por favor llame a Lacy Duverney principal al [phone] para una actualizacin sobre el [place] de cualquier retraso o cierre.  Consejos para la medicacin en dermatologa: Por favor, guarde las cajas en las que vienen los medicamentos de uso tpico para ayudarle a seguir las instrucciones sobre dnde y cmo usarlos. Las farmacias generalmente imprimen las instrucciones del medicamento slo en las cajas y no directamente en los tubos del [place].   Si su medicamento es muy caro, por favor, pngase en contacto con Rolm Gala llamando al [phone] y presione la opcin 4 o envenos un mensaje a travs de Clinical cytogeneticist.   No podemos decirle cul ser su copago por los medicamentos por adelantado ya que esto es diferente dependiendo de la cobertura de su seguro. Sin embargo, es posible que podamos encontrar un medicamento sustituto a Audiological scientist un formulario para que el seguro cubra el medicamento que se considera necesario.   Si se requiere [employer]  autorizacin previa para que su compaa de seguros [place] su medicamento, por favor permtanos de 1 a 2 das hbiles para completar [address]  Los precios de los medicamentos varan con frecuencia dependiendo del Environmental consultant de dnde se surte la receta y alguna farmacias pueden ofrecer precios ms baratos.  El sitio web www.goodrx.com tiene cupones para medicamentos de Health and safety inspector. Los precios aqu no tienen en cuenta lo que podra costar con la ayuda del seguro (puede ser ms barato con su seguro), pero el sitio web puede darle el precio si no utiliz Tourist information centre manager.  - Puede imprimir el cupn correspondiente y llevarlo con su receta a la farmacia.  - Tambin puede pasar por nuestra oficina durante el horario de atencin regular y Education officer, museum una tarjeta de cupones de GoodRx.  - Si necesita que su receta se enve electrnicamente a una farmacia diferente,  informe a nuestra oficina a travs de MyChart de [place] o por telfono llamando al [phone] y presione la opcin 4.

## 2023-05-12 ENCOUNTER — Encounter: Payer: Self-pay | Admitting: Dermatology

## 2023-05-23 DIAGNOSIS — Z419 Encounter for procedure for purposes other than remedying health state, unspecified: Secondary | ICD-10-CM | POA: Diagnosis not present

## 2023-05-24 ENCOUNTER — Ambulatory Visit (INDEPENDENT_AMBULATORY_CARE_PROVIDER_SITE_OTHER): Payer: Medicaid Other

## 2023-05-24 ENCOUNTER — Ambulatory Visit
Admission: EM | Admit: 2023-05-24 | Discharge: 2023-05-24 | Disposition: A | Discharge status: Home / Self Care | Payer: Medicaid Other | Attending: Family Medicine | Admitting: Family Medicine

## 2023-05-24 DIAGNOSIS — J040 Acute laryngitis: Secondary | ICD-10-CM | POA: Diagnosis not present

## 2023-05-24 DIAGNOSIS — R051 Acute cough: Secondary | ICD-10-CM | POA: Diagnosis not present

## 2023-05-24 DIAGNOSIS — J039 Acute tonsillitis, unspecified: Secondary | ICD-10-CM | POA: Diagnosis not present

## 2023-05-24 DIAGNOSIS — R059 Cough, unspecified: Secondary | ICD-10-CM | POA: Diagnosis not present

## 2023-05-24 DIAGNOSIS — J069 Acute upper respiratory infection, unspecified: Secondary | ICD-10-CM | POA: Insufficient documentation

## 2023-05-24 DIAGNOSIS — J029 Acute pharyngitis, unspecified: Secondary | ICD-10-CM

## 2023-05-24 LAB — POCT RAPID STREP A (OFFICE): Rapid Strep A Screen: NEGATIVE

## 2023-05-24 MED ORDER — PREDNISONE 20 MG PO TABS: 40.0000 mg | ORAL_TABLET | Freq: Every day | ORAL | 0 refills | Status: AC

## 2023-05-24 MED ORDER — AMOXICILLIN 500 MG PO CAPS: 500.0000 mg | ORAL_CAPSULE | Freq: Two times a day (BID) | ORAL | 0 refills | Status: AC

## 2023-05-24 MED ORDER — PROMETHAZINE-DM 6.25-15 MG/5ML PO SYRP: 5.0000 mL | ORAL_SOLUTION | Freq: Four times a day (QID) | ORAL | 0 refills | Status: AC | PRN

## 2023-05-24 NOTE — ED Provider Notes (Signed)
 [facility]    CSN: [id number] Arrival date & time: 05/24/23  0801      History   Chief Complaint Chief Complaint  Patient presents with   Sore Throat    HPI Sharon Steele is a 23 y.o. female  presents for evaluation of URI symptoms for 5 days.  Patient uses text message to communicate as she says her throat hurts too bad.  Patient reports associated symptoms of sore throat, cough, headache/migraine, purulent mucus, night sweats, tactile fevers. Denies N/V/D, ear pain, shortness of breath. Patient does not have a hx of asthma. Patient not not an active smoker.   Reports exposure to walking pneumonia.  Pt has taken NyQuil and Tylenol  OTC for symptoms. Pt has no other concerns at this time.    Sore Throat Associated symptoms include headaches.    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 No pertinent surgical history.  OB History     Gravida  1   Para  0   Term  0   Preterm  0   AB  1   Living  0      SAB  1   IAB  0   Ectopic  0   Multiple  0   Live Births  0            Home Medications    Prior to Admission medications   Medication Sig Start Date End Date Taking? Authorizing Provider  amoxicillin  (AMOXIL ) 500 MG capsule Take 1 capsule (500 mg total) by mouth 2 (two) times daily for 10 days. 05/24/23 06/03/23 Yes Khaiden Segreto, Jodi R, NP  predniSONE  (DELTASONE ) 20 MG tablet Take 2 tablets (40 mg total) by mouth daily with breakfast for 5 days. 05/24/23 05/29/23 Yes Saranya Harlin, Jodi R, NP  promethazine -dextromethorphan (PROMETHAZINE -DM) 6.25-15 MG/5ML syrup Take 5 mLs by mouth 4 (four) times daily as needed for cough. 05/24/23  Yes Wednesday Ericsson, Jodi R, NP  ISOtretinoin  (ABSORICA ) 40 MG capsule  Take 1 capsule (40 mg total) by mouth daily. 04/03/23   Claudene Lehmann, MD  ISOtretinoin  (ABSORICA ) 40 MG capsule Take 1 capsule (40 mg total) by mouth daily. 05/09/23   Claudene Lehmann, MD  norgestimate -ethinyl estradiol  (ORTHO-CYCLEN) 0.25-35 MG-MCG tablet Take 1 tablet by mouth daily. 11/22/22   Izell Harari, MD  spironolactone  (ALDACTONE ) 50 MG tablet TAKE 1 TO 2 TABLETS BY MOUTH DAILY. 01/18/23   Claudene Lehmann, MD  cetirizine  (ZYRTEC ) 10 MG tablet Take 1 tablet (10 mg total) by mouth daily. For 3 days 09/06/16 11/24/19  Deis, Jamie, MD  ipratropium (ATROVENT ) 0.06 % nasal spray Place 2 sprays into both nostrils 4 (four) times daily. Patient not taking: Reported on 09/06/2016 07/04/16 11/24/19  Pennie Elsie PARAS, FNP    Family History Family History  Problem Relation Age of Onset   Cancer Mother    Hypertension Father    Hypertension Paternal Grandfather    Hypertension Paternal Grandmother    Diabetes Maternal Grandmother    Diabetes Maternal Grandfather    Hypertension Paternal Aunt    Multiple sclerosis Paternal Aunt     Social History Social History   Tobacco Use   Smoking status: Never   Smokeless tobacco: Never  Vaping Use   Vaping status: Never Used  Substance Use Topics   Alcohol use: Yes   Drug use: Not Currently     Allergies   Cashew nut oil, Almond oil, and Bactrim  [sulfamethoxazole -trimethoprim ]   Review of Systems Review of Systems  Constitutional:  Positive for fever.  HENT:  Positive for congestion and sore throat.   Respiratory:  Positive for cough.   Neurological:  Positive for headaches.     Physical Exam Triage Vital Signs ED Triage Vitals  Encounter Vitals Group     BP 05/24/23 0814 (!) 128/90     Systolic BP Percentile --      Diastolic BP Percentile --      Pulse Rate 05/24/23 0814 83     Resp 05/24/23 0814 16     Temp 05/24/23 0814 99.2 F (37.3 C)     Temp Source 05/24/23 0814 Oral     SpO2 05/24/23 0814 97 %      Weight --      Height --      Head Circumference --      Peak Flow --      Pain Score 05/24/23 0810 6     Pain Loc --      Pain Education --      Exclude from Growth Chart --    No data found.  Updated Vital Signs BP (!) 128/90 (BP Location: Right Arm)   Pulse 83   Temp 99.2 F (37.3 C) (Oral)   Resp 16   SpO2 97%   Visual Acuity Right Eye Distance:   Left Eye Distance:   Bilateral Distance:    Right Eye Near:   Left Eye Near:    Bilateral Near:     Physical Exam Vitals and nursing note reviewed.  Constitutional:      General: She is not in acute distress.    Appearance: She is well-developed. She is not ill-appearing.  HENT:     Head: Normocephalic and atraumatic.     Right Ear: Tympanic membrane and ear canal normal.     Left Ear: Tympanic membrane and ear canal normal.     Nose: Congestion present.     Mouth/Throat:     Mouth: Mucous membranes are moist.     Pharynx: Oropharynx is clear. Uvula midline. Posterior oropharyngeal erythema present.     Tonsils: No tonsillar exudate or tonsillar abscesses. 1+ on the right. 2+ on the left.     Comments: Will start him on Eyes:     Conjunctiva/sclera: Conjunctivae normal.     Pupils: Pupils are equal, round, and reactive to light.  Cardiovascular:     Rate and Rhythm: Normal rate and regular rhythm.     Heart sounds: Normal heart sounds.  Pulmonary:     Effort: Pulmonary effort is normal.     Breath sounds: Normal breath sounds.  Musculoskeletal:     Cervical back: Normal range of motion and neck supple.  Lymphadenopathy:     Cervical: No cervical adenopathy.  Skin:    General: Skin is warm and dry.  Neurological:     General: No focal deficit present.     Mental Status: She is alert and oriented to person, place, and time.  Psychiatric:        Mood and Affect: Mood normal.        Behavior: Behavior normal.      UC Treatments / Results  Labs (all labs ordered are listed, but only  abnormal results are  displayed) Labs Reviewed  CULTURE, GROUP A STREP [facility])  POCT RAPID STREP A (OFFICE)    EKG   Radiology No results found.  Procedures Procedures (including critical care time)  Medications Ordered in UC Medications - No data to display  Initial Impression / Assessment and Plan / UC Course  I have reviewed the triage vital signs and the nursing notes.  Pertinent labs & imaging results that were available during my care of the patient were reviewed by me and considered in my medical decision making (see chart for details).     Reviewed exam and symptoms with patient.  No red flags.  Negative rapid strep, will culture.  Wet read of x-ray without obvious consolidation, will contact for any positive results based on radiology overread.  Will start amoxicillin  for tonsillitis.  Prednisone  daily for 5 days.  Promethazine  DM as needed for cough.  PCP follow-up 2 days for recheck.  ER precautions reviewed. Final Clinical Impressions(s) / UC Diagnoses   Final diagnoses:  Acute cough  Sore throat  Acute tonsillitis, unspecified etiology  Laryngitis  Acute upper respiratory infection     Discharge Instructions      Start amoxicillin  twice daily for 10 days.  Prednisone  daily for 5 days.  You may take Promethazine  DM as needed for cough.  Please note this medication can make you drowsy.  Do not drink alcohol or drive while on this medication.  We also do salt water gargles and warm liquids such as teas and honey.  Lots of rest and fluids.  Please follow-up with your PCP in 2 days for recheck.  Please go to the ER for any worsening symptoms.  I hope you feel better soon!     ED Prescriptions     Medication Sig Dispense Auth. Provider   amoxicillin  (AMOXIL ) 500 MG capsule Take 1 capsule (500 mg total) by mouth 2 (two) times daily for 10 days. 20 capsule Donnielle Addison, Jodi R, NP   predniSONE  (DELTASONE ) 20 MG tablet Take 2 tablets (40 mg total) by mouth daily with breakfast for 5 days. 10  tablet Yer Olivencia, Jodi R, NP   promethazine -dextromethorphan (PROMETHAZINE -DM) 6.25-15 MG/5ML syrup Take 5 mLs by mouth 4 (four) times daily as needed for cough. 118 mL Dainel Arcidiacono, Jodi R, NP      PDMP not reviewed this encounter.   Loreda Myla SAUNDERS, NP 05/24/23 0900

## 2023-05-24 NOTE — ED Triage Notes (Addendum)
 Pt presents to UC for c/o sore throat, loss of voice,migraine, spitting up green mucous, night sweats, fevers x5 days. Pt is communicating via text on phone. Pt has taken nyquil and tylenol for symptoms.

## 2023-05-24 NOTE — Discharge Instructions (Signed)
 Start amoxicillin  twice daily for 10 days.  Prednisone  daily for 5 days.  You may take Promethazine  DM as needed for cough.  Please note this medication can make you drowsy.  Do not drink alcohol or drive while on this medication.  We also do salt water gargles and warm liquids such as teas and honey.  Lots of rest and fluids.  Please follow-up with your PCP in 2 days for recheck.  Please go to the ER for any worsening symptoms.  I hope you feel better soon!

## 2023-05-27 LAB — CULTURE, GROUP A STREP (THRC)

## 2023-06-11 ENCOUNTER — Ambulatory Visit (INDEPENDENT_AMBULATORY_CARE_PROVIDER_SITE_OTHER): Payer: Medicaid Other | Admitting: Dermatology

## 2023-06-11 ENCOUNTER — Encounter: Payer: Self-pay | Admitting: Dermatology

## 2023-06-11 VITALS — Wt 150.4 lb

## 2023-06-11 DIAGNOSIS — K130 Diseases of lips: Secondary | ICD-10-CM | POA: Diagnosis not present

## 2023-06-11 DIAGNOSIS — L853 Xerosis cutis: Secondary | ICD-10-CM | POA: Diagnosis not present

## 2023-06-11 DIAGNOSIS — Z7189 Other specified counseling: Secondary | ICD-10-CM | POA: Diagnosis not present

## 2023-06-11 DIAGNOSIS — Z79899 Other long term (current) drug therapy: Secondary | ICD-10-CM

## 2023-06-11 DIAGNOSIS — L700 Acne vulgaris: Secondary | ICD-10-CM | POA: Diagnosis not present

## 2023-06-11 NOTE — Progress Notes (Unsigned)
Isotretinoin Follow-Up Visit   Subjective  Sharon Steele is a 23 y.o. female who presents for the following: Isotretinoin follow-up. Acne and HS> Taking 40 mg daily. Tolerating well.   Week # 12   Isotretinoin F/U - 06/11/23 1600       Isotretinoin Follow Up   iPledge # [id number]    Date 06/11/23    Weight 150 lb 6.4 oz (68.2 kg)    Two Forms of Birth Control Oral Contraceptives (w/ estrogen);Female Condom    Acne breakouts since last visit? No      Dosage   Target Dosage (mg) 15004    Current (To Date) Dosage (mg) 3000    To Go Dosage (mg) 12004      Skin Side Effects   Dry Lips Yes    Nose bleeds Yes    Dry eyes No    Dry Skin No    Sunburn No      Gastrointestinal Side Effects   Nausea No    Diarrhea No    Blood in stool No      Neurological Side Effects   Blurred vision No    Depression No    Headache No    Homicidal thoughts No    Mood Changes No    Suicidal thoughts No      Constitutional Side Effects   Fatigue No      Musculoskeletal Side Effects   Muscle aches No              Side effects: Dry skin, dry lips  Patient is not pregnant, not seeking pregnancy, and not breastfeeding.   The following portions of the chart were reviewed this encounter and updated as appropriate: medications, allergies, medical history  Review of Systems:  No other skin or systemic complaints except as noted in HPI or Assessment and Plan.  Objective  Well appearing patient in no apparent distress; mood and affect are within normal limits.  An examination of the face, neck, chest, and back was performed and relevant findings are noted below.     Assessment & Plan   ACNE VULGARIS   Related Medications ISOtretinoin (ABSORICA) 40 MG capsule Take 1 capsule (40 mg total) by mouth daily. ISOtretinoin (ABSORICA) 40 MG capsule Take 1 capsule (40 mg total) by mouth daily. LONG-TERM USE OF HIGH-RISK MEDICATION   Related Medications ISOtretinoin (ABSORICA)  40 MG capsule Take 1 capsule (40 mg total) by mouth daily. ISOtretinoin (ABSORICA) 40 MG capsule Take 1 capsule (40 mg total) by mouth daily. COUNSELING AND COORDINATION OF CARE   Related Medications ISOtretinoin (ABSORICA) 40 MG capsule Take 1 capsule (40 mg total) by mouth daily.  ACNE VULGARIS Patient is currently on Isotretinoin requiring FDA mandated monthly evaluations and laboratory monitoring. Condition is currently not to goal (must reach target dose based on weight and also have clear skin for 2 months prior to discontinuation in order to help prevent relapse)  Exam findings: Scattered hyperpigmented macules. On face, rare inflammatory papules, significantly improved  Week # 12 Pharmacy [facility]  iPLEDGE #  [id number]  Total mg -  3000 mg Total mg/kg - 43.99 mg/kg Birth Control- oral birth control, female latex condoms   Increase isotretinoin to 30 mg x 2 pills once daily   Urine pregnancy test performed in office today and was negative.  Patient demonstrates comprehension and confirms she will not get pregnant.   Patient confirmed in iPledge and isotretinoin sent to pharmacy.   Isotretinoin Counseling;  Review and Contraception Counseling: Reviewed potential side effects of isotretinoin including xerosis, cheilitis, hepatitis, hyperlipidemia, and severe birth defects if taken by a pregnant woman.  Women on isotretinoin must be celibate (not having sex) or required to use at least 2 birth control methods to prevent pregnancy (unless patient is a female of non-child bearing potential).  Females of child-bearing potential must have monthly pregnancy tests while on isotretinoin and report through I-Pledge (FDA monitoring program). Reviewed reports of suicidal ideation in those with a history of depression while taking isotretinoin and reports of diagnosis of inflammatory bowl disease (IBD) while taking isotretinoin as well as the lack of evidence for a causal  relationship between isotretinoin, depression and IBD. Patient advised to reach out with any questions or concerns. Patient advised not to share pills or donate blood while on treatment or for one month after completing treatment. All patient's considering Isotretinoin must read and understand and sign Isotretinoin Consent Form and be registered with I-Pledge.  Xerosis secondary to isotretinoin therapy - Continue emollients as directed - Xyzal (levocetirizine) once a day and fish oil 1 gram daily may also help with dryness   Cheilitis secondary to isotretinoin therapy - Continue lip balm as directed, Dr. Clayborne Artist Cortibalm recommended   Long term medication management (isotretinoin)  Patient is using long term (months to years) prescription medication  to control their dermatologic condition.  These medications require periodic monitoring to evaluate for efficacy and side effects and may require periodic laboratory monitoring.  - While taking Isotretinoin and for 30 days after you finish the medication, do not get pregnant, do not share pills, do not donate blood. Isotretinoin is best absorbed when taken with a fatty meal. Isotretinoin can make you sensitive to the sun. Daily careful sun protection including sunscreen SPF 30+ when outdoors is recommended.  HIDRADENITIS SUPPURATIVA Bil inframammary Exam: not examined today   Chronic and persistent condition with duration or expected duration over one year. Condition is symptomatic / bothersome to patient. Not to goal.     Hidradenitis Suppurativa is a chronic; persistent; non-curable, but treatable condition due to abnormal inflamed sweat glands in the body folds (axilla, inframammary, groin, medial thighs), causing recurrent painful draining cysts and scarring. It can be associated with severe scarring acne and cysts; also abscesses and scarring of scalp. The goal is control and prevention of flares, as it is not curable. Scars are permanent and  can be thickened. Treatment may include daily use of topical medication and oral antibiotics.  Oral isotretinoin may also be helpful.  For some cases, Humira or Cosentyx (biologic injections) may be prescribed to decrease the inflammatory process and prevent flares.  When indicated, inflamed cysts may also be treated surgically.   Treatment Plan: Increase to isotretinoin 60 mg daily Cont Spironolactone 50mg  1 po qd     Follow-up in 30 days.  I, Lawson Radar, CMA, am acting as scribe for Elie Goody, MD.   Documentation: I have reviewed the above documentation for accuracy and completeness, and I agree with the above.  Elie Goody, MD

## 2023-06-11 NOTE — Patient Instructions (Signed)
Start Isotretinoin 30 mg 2 pills once daily.    While taking Isotretinoin and for 30 days after you finish the medication, do not get pregnant, do not share pills, do not donate blood.  Generic isotretinoin is best absorbed when taken with a fatty meal. Isotretinoin can make you sensitive to the sun. Daily careful sun protection including sunscreen SPF 30+ when outdoors is recommended.   Due to recent changes in healthcare laws, you may see results of your pathology and/or laboratory studies on MyChart before the doctors have had a chance to review them. We understand that in some cases there may be results that are confusing or concerning to you. Please understand that not all results are received at the same time and often the doctors may need to interpret multiple results in order to provide you with the best plan of care or course of treatment. Therefore, we ask that you please give [place] 2 business days to thoroughly review all your results before contacting the office for clarification. Should we see a critical lab result, you will be contacted sooner.   If You Need Anything After Your Visit  If you have any questions or concerns for your doctor, please call our main line at [phone] and press option 4 to reach your doctor's medical assistant. If no one answers, please leave a voicemail as directed and we will return your call as soon as possible. Messages left after 4 pm will be answered the following business day.   You may also send [place] a message via MyChart. We typically respond to MyChart messages within 1-2 business days.  For prescription refills, please ask your pharmacy to contact our office. Our fax number is [phone].  If you have an urgent issue when the clinic is closed that cannot wait until the next business day, you can page your doctor at the number below.    Please note that while we do our best to be available for urgent issues outside of office hours, we are not  available 24/7.   If you have an urgent issue and are unable to reach [place], you may choose to seek medical care at your doctor's office, retail clinic, urgent care center, or emergency room.  If you have a medical emergency, please immediately call 911 or go to the emergency department.  Pager Numbers  - Dr. Gwen Pounds: [phone]  - Dr. Roseanne Reno: [phone]  - Dr. Katrinka Blazing: [phone]   In the event of inclement weather, please call our main line at [phone] for an update on the status of any delays or closures.  Dermatology Medication Tips: Please keep the boxes that topical medications come in in order to help keep track of the instructions about where and how to use these. Pharmacies typically print the medication instructions only on the boxes and not directly on the medication tubes.   If your medication is too expensive, please contact our office at [phone] option 4 or send [place] a message through MyChart.   We are unable to tell what your co-pay for medications will be in advance as this is different depending on your insurance coverage. However, we may be able to find a substitute medication at lower cost or fill out paperwork to get insurance to cover a needed medication.   If a prior authorization is required to get your medication covered by your insurance company, please allow [place] 1-2 business days to complete this process.  Drug prices often vary depending on where the prescription  is filled and some pharmacies may offer cheaper prices.  The website www.goodrx.com contains coupons for medications through different pharmacies. The prices here do not account for what the cost may be with help from insurance (it may be cheaper with your insurance), but the website can give you the price if you did not use any insurance.  - You can print the associated coupon and take it with your prescription to the pharmacy.  - You may also stop by our office during regular business hours  and pick up a GoodRx coupon card.  - If you need your prescription sent electronically to a different pharmacy, notify our office through [facility] or by phone at [phone] option 4.     Si Usted Necesita Algo Despus de Su Visita  Tambin puede enviarnos un mensaje a travs de Clinical cytogeneticist. Por lo general respondemos a los mensajes de MyChart en el transcurso de 1 a 2 das hbiles.  Para renovar recetas, por favor pida a su farmacia que se ponga en contacto con nuestra oficina. Annie Sable de fax es [place] [phone].  Si tiene un asunto urgente cuando la clnica est cerrada y que no puede esperar hasta el siguiente da hbil, puede llamar/localizar a su doctor(a) al nmero que aparece a continuacin.   Por favor, tenga en cuenta que aunque hacemos todo lo posible para estar disponibles para asuntos urgentes fuera del horario de [place], no estamos disponibles las 24 horas del da, los 7 [address]  [address] de [place].   Si tiene un problema urgente y no puede comunicarse con nosotros, puede optar por buscar atencin mdica  en el consultorio de su doctor(a), en una clnica privada, en un centro de atencin urgente o en una sala de emergencias.  Si tiene Engineer, drilling, por favor llame inmediatamente al 911 o vaya a la sala de emergencias.  Nmeros de bper  - Dr. Gwen Pounds: [phone]  - Dra. Roseanne Reno: [phone]  - Dr. Katrinka Blazing: [phone]   En caso de inclemencias del tiempo, por favor llame a Lacy Duverney principal al [phone] para una actualizacin sobre el [place] de cualquier retraso o cierre.  Consejos para la medicacin en dermatologa: Por favor, guarde las cajas en las que vienen los medicamentos de uso tpico para ayudarle a seguir las instrucciones sobre dnde y cmo usarlos. Las farmacias generalmente imprimen las instrucciones del medicamento slo en las cajas y no directamente en los tubos del [place].   Si su medicamento es muy caro, por favor, pngase  en contacto con Rolm Gala llamando al [phone] y presione la opcin 4 o envenos un mensaje a travs de Clinical cytogeneticist.   No podemos decirle cul ser su copago por los medicamentos por adelantado ya que esto es diferente dependiendo de la cobertura de su seguro. Sin embargo, es posible que podamos encontrar un medicamento sustituto a Audiological scientist un formulario para que el seguro cubra el medicamento que se considera necesario.   Si se requiere una autorizacin previa para que su compaa de seguros [place] su medicamento, por favor permtanos de 1 a 2 das hbiles para completar [address]  Los precios de los medicamentos varan con frecuencia dependiendo del Environmental consultant de dnde se surte la receta y alguna farmacias pueden ofrecer precios ms baratos.  El sitio web www.goodrx.com tiene cupones para medicamentos de Health and safety inspector. Los precios aqu no tienen en cuenta lo que podra costar con la ayuda del seguro (puede ser ms barato con su seguro), pero el sitio web  puede darle el precio si no utiliz [employer].  - Puede imprimir el cupn correspondiente y llevarlo con su receta a la farmacia.  - Tambin puede pasar por nuestra oficina durante el horario de atencin regular y Education officer, museum una tarjeta de cupones de GoodRx.  - Si necesita que su receta se enve electrnicamente a una farmacia diferente, informe a nuestra oficina a travs de MyChart de [place] o por telfono llamando al [phone] y presione la opcin 4.

## 2023-06-12 ENCOUNTER — Encounter: Payer: Self-pay | Admitting: Dermatology

## 2023-06-18 NOTE — Telephone Encounter (Signed)
Spoke with patient. She was sent 40mg  from [facility]. I don't see where her correct RX of 30mg  BID was sent in last week. She has a month supply of 40mg  and only about two weeks left of a 20mg  pack at home.  How should patient take medication until her follow up?

## 2023-06-18 NOTE — Telephone Encounter (Signed)
Left msg for pt to return my call.  Need to re do pregnancy test and re confirm patient to send in another 2 weeks of 20mg . aw

## 2023-06-21 ENCOUNTER — Other Ambulatory Visit: Payer: Self-pay

## 2023-06-21 MED ORDER — ISOTRETINOIN 20 MG PO CAPS: 20.0000 mg | ORAL_CAPSULE | Freq: Every day | ORAL | 0 refills | Status: AC

## 2023-06-21 NOTE — Progress Notes (Signed)
Pregnancy test received. Confirmed in iPledge. 2 weeks of 20 mg sent to [facility]. Patient advised via MyChart message to demonstrate comprehension on iPledge.

## 2023-06-23 DIAGNOSIS — Z419 Encounter for procedure for purposes other than remedying health state, unspecified: Secondary | ICD-10-CM | POA: Diagnosis not present

## 2023-06-25 ENCOUNTER — Ambulatory Visit
Admission: EM | Admit: 2023-06-25 | Discharge: 2023-06-25 | Disposition: A | Discharge status: Home / Self Care | Payer: Medicaid Other | Attending: Family Medicine | Admitting: Family Medicine

## 2023-06-25 DIAGNOSIS — B359 Dermatophytosis, unspecified: Secondary | ICD-10-CM | POA: Diagnosis not present

## 2023-06-25 MED ORDER — CLOTRIMAZOLE-BETAMETHASONE 1-0.05 % EX CREA: TOPICAL_CREAM | CUTANEOUS | 0 refills | Status: DC

## 2023-06-25 MED ORDER — FLUCONAZOLE 200 MG PO TABS: 200.0000 mg | ORAL_TABLET | ORAL | 0 refills | Status: AC

## 2023-06-25 NOTE — Discharge Instructions (Addendum)
Start Diflucan 200 mg once weekly for 6 weeks to treat the fungal infection on your hand and foot.  Also prescribed you a topical cream that will help with the itching and localized irritation you may use twice daily as needed.  And that the area is located on the palmar surface of your hand I would recommend wearing a glove or covering to prevent irritation and spread of fungal infection.

## 2023-06-25 NOTE — ED Provider Notes (Signed)
[facility]    CSN: [id number] Arrival date & time: 06/25/23  0821      History   Chief Complaint Chief Complaint  Patient presents with   Rash    Hand and foot     HPI Sharon Steele is a 23 y.o. female.   HPI Patient presents today with a circular rash on the palmar surface of her right hand and the palmar surface of her left foot.  She only noticed it this morning as it was itching and burning.  She reports that she has a new puppy that has been in her home for 3 weeks however that is the only environmental change she is aware of.  Reports that she wears footwear and does not recall contact with any irritant.  The rash is not present anywhere else on her body.  She is concerned for ringworm.  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 No pertinent surgical history.  OB History     Gravida  1   Para  0   Term  0   Preterm  0   AB  1   Living  0      SAB  1   IAB  0   Ectopic  0   Multiple  0   Live Births  0            Home Medications    Prior to Admission medications   Medication Sig Start Date End Date Taking? Authorizing Provider  clotrimazole-betamethasone (LOTRISONE) cream Apply to affected area 2 times daily prn 06/25/23  Yes Bing Neighbors, NP  fluconazole (DIFLUCAN) 200 MG tablet Take 1 tablet (200 mg total) by mouth once a week for 6 doses. 06/25/23 07/31/23 Yes Bing Neighbors, NP  ISOtretinoin (ABSORICA) 20 MG capsule Take 1 capsule (20 mg total) by mouth daily. 06/21/23  Yes Elie Goody, MD  ISOtretinoin (ABSORICA) 40 MG capsule Take 1 capsule (40 mg total) by mouth daily. 04/03/23  Yes Elie Goody, MD  ISOtretinoin (ABSORICA) 40 MG  capsule Take 1 capsule (40 mg total) by mouth daily. 05/09/23  Yes Elie Goody, MD  spironolactone (ALDACTONE) 50 MG tablet TAKE 1 TO 2 TABLETS BY MOUTH DAILY. 01/18/23  Yes Elie Goody, MD  norgestimate-ethinyl estradiol (ORTHO-CYCLEN) 0.25-35 MG-MCG tablet Take 1 tablet by mouth daily. 11/22/22   [place] Bing, MD  promethazine-dextromethorphan (PROMETHAZINE-DM) 6.25-15 MG/5ML syrup Take 5 mLs by mouth 4 (four) times daily as needed for cough. 05/24/23   Radford Pax, NP  cetirizine (ZYRTEC) 10 MG tablet Take 1 tablet (10 mg total) by mouth daily. For 3 days 09/06/16 11/24/19  Ree Shay, MD  ipratropium (ATROVENT) 0.06 % nasal spray Place 2 sprays into both nostrils 4 (four) times daily. Patient not taking: Reported on 09/06/2016 07/04/16 11/24/19  Deatra Canter, FNP    Family History Family History  Problem Relation Age of Onset   Cancer Mother    Hypertension Father    Hypertension Paternal Grandfather    Hypertension Paternal Grandmother    Diabetes Maternal Grandmother    Diabetes Maternal Grandfather    Hypertension Paternal Aunt    Multiple sclerosis Paternal Aunt  Social History Social History   Tobacco Use   Smoking status: Never   Smokeless tobacco: Never  Vaping Use   Vaping status: Never Used  Substance Use Topics   Alcohol use: Yes   Drug use: Not Currently     Allergies   Cashew nut oil, Almond oil, and Bactrim [sulfamethoxazole-trimethoprim]   Review of Systems Review of Systems Pertinent negatives listed in HPI   Physical Exam Triage Vital Signs ED Triage Vitals  Encounter Vitals Group     BP 06/25/23 0830 115/78     Systolic BP Percentile --      Diastolic BP Percentile --      Pulse Rate 06/25/23 0830 76     Resp 06/25/23 0830 16     Temp 06/25/23 0830 98.1 F (36.7 C)     Temp Source 06/25/23 0830 Oral     SpO2 06/25/23 0830 98 %     Weight --      Height --      Head Circumference --      Peak Flow --      Pain Score  06/25/23 0827 2     Pain Loc --      Pain Education --      Exclude from Growth Chart --    No data found.  Updated Vital Signs BP 115/78 (BP Location: Left Arm)   Pulse 76   Temp 98.1 F (36.7 C) (Oral)   Resp 16   SpO2 98%   Visual Acuity Right Eye Distance:   Left Eye Distance:   Bilateral Distance:    Right Eye Near:   Left Eye Near:    Bilateral Near:     Physical Exam Vitals reviewed.  Constitutional:      Appearance: Normal appearance.  HENT:     Head: Normocephalic and atraumatic.  Cardiovascular:     Rate and Rhythm: Normal rate and regular rhythm.  Pulmonary:     Effort: Pulmonary effort is normal.     Breath sounds: Normal breath sounds.  Musculoskeletal:       Arms:       Feet:  Neurological:     Mental Status: She is alert.      UC Treatments / Results  Labs (all labs ordered are listed, but only abnormal results are displayed) Labs Reviewed - No data to display  EKG   Radiology No results found.  Procedures Procedures (including critical care time)  Medications Ordered in UC Medications - No data to display  Initial Impression / Assessment and Plan / UC Course  I have reviewed the triage vital signs and the nursing notes.  Pertinent labs & imaging results that were available during my care of the patient were reviewed by me and considered in my medical decision making (see chart for details).   Rash appearance is that of a tinea rash involving the left foot and right hand. Treating orally with Diflucan 200 mg once weekly for 6 weeks. Lotrisone cream prescribed for applications twice daily as needed for itching and irritation.  Patient advised to cover rash for infection prevention.  Return precautions given if symptoms do not resolve with treatment.  Educated fungal rashes intervally quire and extended period of time to completely resolved. Final Clinical Impressions(s) / UC Diagnoses   Final diagnoses:  Tinea     Discharge  Instructions      Start Diflucan 200 mg once weekly for 6 weeks to treat the fungal infection on your  hand and foot.  Also prescribed you a topical cream that will help with the itching and localized irritation you may use twice daily as needed.  And that the area is located on the palmar surface of your hand I would recommend wearing a glove or covering to prevent irritation and spread of fungal infection.    ED Prescriptions     Medication Sig Dispense Auth. Provider   fluconazole (DIFLUCAN) 200 MG tablet Take 1 tablet (200 mg total) by mouth once a week for 6 doses. 6 tablet Bing Neighbors, NP   clotrimazole-betamethasone (LOTRISONE) cream Apply to affected area 2 times daily prn 90 g Bing Neighbors, NP      PDMP not reviewed this encounter.   Bing Neighbors, NP 06/25/23 [phone]

## 2023-06-25 NOTE — ED Triage Notes (Signed)
Pt presents to UC for c/o rash on palm of right hand and bottom of left foot starting  this morning. Pt reports the rash on the foot is itchy, while the hand is painful.

## 2023-07-16 ENCOUNTER — Ambulatory Visit: Payer: Medicaid Other | Admitting: Dermatology

## 2023-07-21 DIAGNOSIS — Z419 Encounter for procedure for purposes other than remedying health state, unspecified: Secondary | ICD-10-CM | POA: Diagnosis not present

## 2023-08-16 ENCOUNTER — Ambulatory Visit: Payer: Medicaid Other | Admitting: Dermatology

## 2023-08-20 ENCOUNTER — Ambulatory Visit (INDEPENDENT_AMBULATORY_CARE_PROVIDER_SITE_OTHER): Admitting: Dermatology

## 2023-08-20 ENCOUNTER — Encounter: Payer: Self-pay | Admitting: Dermatology

## 2023-08-20 VITALS — Wt 150.4 lb

## 2023-08-20 DIAGNOSIS — L732 Hidradenitis suppurativa: Secondary | ICD-10-CM | POA: Diagnosis not present

## 2023-08-20 DIAGNOSIS — L700 Acne vulgaris: Secondary | ICD-10-CM

## 2023-08-20 DIAGNOSIS — Z7189 Other specified counseling: Secondary | ICD-10-CM | POA: Diagnosis not present

## 2023-08-20 DIAGNOSIS — K130 Diseases of lips: Secondary | ICD-10-CM | POA: Diagnosis not present

## 2023-08-20 DIAGNOSIS — L853 Xerosis cutis: Secondary | ICD-10-CM

## 2023-08-20 DIAGNOSIS — Z79899 Other long term (current) drug therapy: Secondary | ICD-10-CM

## 2023-08-20 MED ORDER — SPIRONOLACTONE 50 MG PO TABS: ORAL_TABLET | ORAL | 0 refills | Status: AC

## 2023-08-20 MED ORDER — ISOTRETINOIN 30 MG PO CAPS: 30.0000 mg | ORAL_CAPSULE | Freq: Two times a day (BID) | ORAL | 0 refills | Status: AC

## 2023-08-20 NOTE — Progress Notes (Signed)
 Isotretinoin Follow-Up Visit   Subjective  Sharon Steele is a 23 y.o. female who presents for the following: Isotretinoin follow-up, pt currently taking 60mg  total daily. Patient reports more active flares in groin for HS since increasing to 60mg , pt reports was doing better with 40mg . Patient also taking spironolactone with no side effects. Patient reports dry mouth.   Week # 16   Isotretinoin F/U - 08/20/23 0900       Isotretinoin Follow Up   iPledge # [id number]    Date 08/20/23    Weight 150 lb 6.4 oz (68.2 kg)    Two Forms of Birth Control Oral Contraceptives (w/ estrogen);Female Condom    Acne breakouts since last visit? Yes      Dosage   Target Dosage (mg) 15004    Current (To Date) Dosage (mg) 4800    To Go Dosage (mg) 10204      Skin Side Effects   Dry Lips Yes    Nose bleeds No    Dry eyes No    Dry Skin No    Sunburn No      Gastrointestinal Side Effects   Nausea No    Diarrhea No    Blood in stool No      Neurological Side Effects   Blurred vision No    Depression No    Headache No    Homicidal thoughts No    Mood Changes No    Suicidal thoughts No      Constitutional Side Effects   Fatigue No      Musculoskeletal Side Effects   Muscle aches No              Side effects: Dry skin, dry lips  Patient is not pregnant, not seeking pregnancy, and not breastfeeding.   The following portions of the chart were reviewed this encounter and updated as appropriate: medications, allergies, medical history  Review of Systems:  No other skin or systemic complaints except as noted in HPI or Assessment and Plan.  Objective  Well appearing patient in no apparent distress; mood and affect are within normal limits.  An examination of the face, neck, chest, and back was performed and relevant findings are noted below.     Assessment & Plan   ACNE VULGARIS   Related Medications ISOtretinoin (ABSORICA) 40 MG capsule Take 1 capsule (40 mg total)  by mouth daily. ISOtretinoin (ABSORICA) 40 MG capsule Take 1 capsule (40 mg total) by mouth daily. LONG-TERM USE OF HIGH-RISK MEDICATION   Related Medications ISOtretinoin (ABSORICA) 40 MG capsule Take 1 capsule (40 mg total) by mouth daily. ISOtretinoin (ABSORICA) 40 MG capsule Take 1 capsule (40 mg total) by mouth daily. COUNSELING AND COORDINATION OF CARE   Related Medications ISOtretinoin (ABSORICA) 40 MG capsule Take 1 capsule (40 mg total) by mouth daily. HIDRADENITIS SUPPURATIVA    ACNE VULGARIS Patient is currently on Isotretinoin requiring FDA mandated monthly evaluations and laboratory monitoring. Condition is currently not to goal (must reach target dose based on weight and also have clear skin for 2 months prior to discontinuation in order to help prevent relapse)  Exam findings: Scattered PIH and scars on cheeks forehead chin. Few inflamed papules on chin. Overall improved  Week # 16 Pharmacy [facility] #  [id number]  Total mg -  4,800 mg Total mg/kg - 70.38 mg/kg Birth Control- oral birth control, female latex condoms.   Continue isotretinoin 60mg  daily (2-30mg  tablets daily)   Urine pregnancy  test performed in office today and was negative.  Patient demonstrates comprehension and confirms she will not get pregnant.   Patient confirmed in iPledge and isotretinoin sent to pharmacy.   Isotretinoin Counseling; Review and Contraception Counseling: Reviewed potential side effects of isotretinoin including xerosis, cheilitis, hepatitis, hyperlipidemia, and severe birth defects if taken by a pregnant woman.  Women on isotretinoin must be celibate (not having sex) or required to use at least 2 birth control methods to prevent pregnancy (unless patient is a female of non-child bearing potential).  Females of child-bearing potential must have monthly pregnancy tests while on isotretinoin and report through I-Pledge (FDA monitoring program). Reviewed  reports of suicidal ideation in those with a history of depression while taking isotretinoin and reports of diagnosis of inflammatory bowl disease (IBD) while taking isotretinoin as well as the lack of evidence for a causal relationship between isotretinoin, depression and IBD. Patient advised to reach out with any questions or concerns. Patient advised not to share pills or donate blood while on treatment or for one month after completing treatment. All patient's considering Isotretinoin must read and understand and sign Isotretinoin Consent Form and be registered with I-Pledge.  Xerosis secondary to isotretinoin therapy - Continue emollients as directed - Xyzal (levocetirizine) once a day and fish oil 1 gram daily may also help with dryness   Cheilitis secondary to isotretinoin therapy - Continue lip balm as directed, Dr. Clayborne Artist Cortibalm recommended   Long term medication management (isotretinoin)  Patient is using long term (months to years) prescription medication  to control their dermatologic condition.  These medications require periodic monitoring to evaluate for efficacy and side effects and may require periodic laboratory monitoring.  - While taking Isotretinoin and for 30 days after you finish the medication, do not get pregnant, do not share pills, do not donate blood. Isotretinoin is best absorbed when taken with a fatty meal. Isotretinoin can make you sensitive to the sun. Daily careful sun protection including sunscreen SPF 30+ when outdoors is recommended.  HIDRADENITIS SUPPURATIVA Exam: Areas not examined today  Chronic and persistent condition with duration or expected duration over one year. Condition is bothersome/symptomatic for patient. Currently flared.   Hidradenitis Suppurativa is a chronic; persistent; non-curable, but treatable condition due to abnormal inflamed sweat glands in the body folds (axilla, inframammary, groin, medial thighs), causing recurrent painful  draining cysts and scarring. It can be associated with severe scarring acne and cysts; also abscesses and scarring of scalp. The goal is control and prevention of flares, as it is not curable. Scars are permanent and can be thickened. Treatment may include daily use of topical medication and oral antibiotics.  Oral isotretinoin may also be helpful.  For some cases, Humira or Cosentyx (biologic injections) may be prescribed to decrease the inflammatory process and prevent flares.  When indicated, inflamed cysts may also be treated surgically.  Treatment Plan: Continue Spironolactone 50mg  po daily. Recommend using Hibiclens in shower daily, let sit few minutes then rinse off.   Spironolactone can cause increased urination and cause blood pressure to decrease. Please watch for signs of lightheadedness and be cautious when changing position. It can sometimes cause breast tenderness or an irregular period in premenopausal women. It can also increase potassium. The increase in potassium usually is not a concern unless you are taking other medicines that also increase potassium, so please be sure your doctor knows all of the other medications you are taking. This medication should not be taken by pregnant women.  This medicine should also not be taken together with sulfa drugs like Bactrim (trimethoprim/sulfamethexazole).    Follow-up in 30 days.  Wynonia Lawman, CMA, am acting as scribe for Elie Goody, MD .   Documentation: I have reviewed the above documentation for accuracy and completeness, and I agree with the above.  Elie Goody, MD

## 2023-08-20 NOTE — Patient Instructions (Addendum)
 Use in shower, let sit a few minutes then rinse off.    Due to recent changes in healthcare laws, you may see results of your pathology and/or laboratory studies on MyChart before the doctors have had a chance to review them. We understand that in some cases there may be results that are confusing or concerning to you. Please understand that not all results are received at the same time and often the doctors may need to interpret multiple results in order to provide you with the best plan of care or course of treatment. Therefore, we ask that you please give [place] 2 business days to thoroughly review all your results before contacting the office for clarification. Should we see a critical lab result, you will be contacted sooner.   If You Need Anything After Your Visit  If you have any questions or concerns for your doctor, please call our main line at [phone] and press option 4 to reach your doctor's medical assistant. If no one answers, please leave a voicemail as directed and we will return your call as soon as possible. Messages left after 4 pm will be answered the following business day.   You may also send [place] a message via MyChart. We typically respond to MyChart messages within 1-2 business days.  For prescription refills, please ask your pharmacy to contact our office. Our fax number is [phone].  If you have an urgent issue when the clinic is closed that cannot wait until the next business day, you can page your doctor at the number below.    Please note that while we do our best to be available for urgent issues outside of office hours, we are not available 24/7.   If you have an urgent issue and are unable to reach [place], you may choose to seek medical care at your doctor's office, retail clinic, urgent care center, or emergency room.  If you have a medical emergency, please immediately call 911 or go to the emergency department.  Pager Numbers  - Dr. Gwen Pounds:  [phone]  - Dr. Roseanne Reno: [phone]  - Dr. Katrinka Blazing: [phone]   In the event of inclement weather, please call our main line at [phone] for an update on the status of any delays or closures.  Dermatology Medication Tips: Please keep the boxes that topical medications come in in order to help keep track of the instructions about where and how to use these. Pharmacies typically print the medication instructions only on the boxes and not directly on the medication tubes.   If your medication is too expensive, please contact our office at [phone] option 4 or send [place] a message through MyChart.   We are unable to tell what your co-pay for medications will be in advance as this is different depending on your insurance coverage. However, we may be able to find a substitute medication at lower cost or fill out paperwork to get insurance to cover a needed medication.   If a prior authorization is required to get your medication covered by your insurance company, please allow [place] 1-2 business days to complete this process.  Drug prices often vary depending on where the prescription is filled and some pharmacies may offer cheaper prices.  The website www.goodrx.com contains coupons for medications through different pharmacies. The prices here do not account for what the cost may be with help from insurance (it may be cheaper with your insurance), but the website can give you the price if you did not  use any insurance.  - You can print the associated coupon and take it with your prescription to the pharmacy.  - You may also stop by our office during regular business hours and pick up a GoodRx coupon card.  - If you need your prescription sent electronically to a different pharmacy, notify our office through [facility] or by phone at [phone] option 4.     Si Usted Necesita Algo Despus de Su Visita  Tambin puede enviarnos un mensaje a travs de Clinical cytogeneticist. Por lo general  respondemos a los mensajes de MyChart en el transcurso de 1 a 2 das hbiles.  Para renovar recetas, por favor pida a su farmacia que se ponga en contacto con nuestra oficina. Annie Sable de fax es [place] [phone].  Si tiene un asunto urgente cuando la clnica est cerrada y que no puede esperar hasta el siguiente da hbil, puede llamar/localizar a su doctor(a) al nmero que aparece a continuacin.   Por favor, tenga en cuenta que aunque hacemos todo lo posible para estar disponibles para asuntos urgentes fuera del horario de [place], no estamos disponibles las 24 horas del da, los 7 [address]  [address] de la [place].   Si tiene un problema urgente y no puede comunicarse con nosotros, puede optar por buscar atencin mdica  en el consultorio de su doctor(a), en una clnica privada, en un centro de atencin urgente o en una sala de emergencias.  Si tiene Engineer, drilling, por favor llame inmediatamente al 911 o vaya a la sala de emergencias.  Nmeros de bper  - Dr. Gwen Pounds: [phone]  - Dra. Roseanne Reno: [phone]  - Dr. Katrinka Blazing: [phone]   En caso de inclemencias del tiempo, por favor llame a Lacy Duverney principal al [phone] para una actualizacin sobre el [place] de cualquier retraso o cierre.  Consejos para la medicacin en dermatologa: Por favor, guarde las cajas en las que vienen los medicamentos de uso tpico para ayudarle a seguir las instrucciones sobre dnde y cmo usarlos. Las farmacias generalmente imprimen las instrucciones del medicamento slo en las cajas y no directamente en los tubos del [place].   Si su medicamento es muy caro, por favor, pngase en contacto con Rolm Gala llamando al [phone] y presione la opcin 4 o envenos un mensaje a travs de Clinical cytogeneticist.   No podemos decirle cul ser su copago por los medicamentos por adelantado ya que esto es diferente dependiendo de la cobertura de su seguro. Sin embargo, es posible que podamos encontrar un  medicamento sustituto a Audiological scientist un formulario para que el seguro cubra el medicamento que se considera necesario.   Si se requiere una autorizacin previa para que su compaa de seguros [place] su medicamento, por favor permtanos de 1 a 2 das hbiles para completar [address]  Los precios de los medicamentos varan con frecuencia dependiendo del Environmental consultant de dnde se surte la receta y alguna farmacias pueden ofrecer precios ms baratos.  El sitio web www.goodrx.com tiene cupones para medicamentos de Health and safety inspector. Los precios aqu no tienen en cuenta lo que podra costar con la ayuda del seguro (puede ser ms barato con su seguro), pero el sitio web puede darle el precio si no utiliz Tourist information centre manager.  - Puede imprimir el cupn correspondiente y llevarlo con su receta a la farmacia.  - Tambin puede pasar por nuestra oficina durante el horario de atencin regular y Education officer, museum una tarjeta de cupones de GoodRx.  - Si necesita que su receta se enve  electrnicamente a [place] diferente, informe a nuestra oficina a travs de MyChart de [place] o por telfono llamando al [phone] y presione la opcin 4.

## 2023-08-30 ENCOUNTER — Ambulatory Visit: Admitting: Obstetrics and Gynecology

## 2023-08-30 DIAGNOSIS — Z113 Encounter for screening for infections with a predominantly sexual mode of transmission: Secondary | ICD-10-CM | POA: Diagnosis not present

## 2023-08-30 DIAGNOSIS — Z3202 Encounter for pregnancy test, result negative: Secondary | ICD-10-CM | POA: Diagnosis not present

## 2023-08-30 DIAGNOSIS — Z202 Contact with and (suspected) exposure to infections with a predominantly sexual mode of transmission: Secondary | ICD-10-CM | POA: Diagnosis not present

## 2023-08-31 ENCOUNTER — Ambulatory Visit

## 2023-09-18 ENCOUNTER — Ambulatory Visit: Admitting: Obstetrics & Gynecology

## 2023-09-18 ENCOUNTER — Other Ambulatory Visit (HOSPITAL_COMMUNITY)
Admission: RE | Admit: 2023-09-18 | Discharge: 2023-09-18 | Disposition: A | Discharge status: Home / Self Care | Source: Ambulatory Visit | Attending: Obstetrics & Gynecology | Admitting: Obstetrics & Gynecology

## 2023-09-18 ENCOUNTER — Encounter: Payer: Self-pay | Admitting: Obstetrics & Gynecology

## 2023-09-18 VITALS — BP 162/99 | HR 65 | Wt 159.0 lb

## 2023-09-18 DIAGNOSIS — I10 Essential (primary) hypertension: Secondary | ICD-10-CM

## 2023-09-18 DIAGNOSIS — N9089 Other specified noninflammatory disorders of vulva and perineum: Secondary | ICD-10-CM | POA: Insufficient documentation

## 2023-09-18 DIAGNOSIS — N760 Acute vaginitis: Secondary | ICD-10-CM

## 2023-09-18 DIAGNOSIS — Z01419 Encounter for gynecological examination (general) (routine) without abnormal findings: Secondary | ICD-10-CM

## 2023-09-18 DIAGNOSIS — Z3041 Encounter for surveillance of contraceptive pills: Secondary | ICD-10-CM

## 2023-09-18 DIAGNOSIS — B9689 Other specified bacterial agents as the cause of diseases classified elsewhere: Secondary | ICD-10-CM

## 2023-09-18 LAB — POCT URINALYSIS DIPSTICK

## 2023-09-18 MED ORDER — SLYND 4 MG PO TABS: 1.0000 | ORAL_TABLET | Freq: Every day | ORAL | 2 refills | Status: AC

## 2023-09-18 MED ORDER — NORGESTIMATE-ETH ESTRADIOL 0.25-35 MG-MCG PO TABS: 1.0000 | ORAL_TABLET | Freq: Every day | ORAL | 3 refills | Status: DC

## 2023-09-18 NOTE — Progress Notes (Signed)
 GYNECOLOGY ANNUAL PREVENTATIVE CARE ENCOUNTER NOTE  History:     Sharon Steele is a 23 y.o. [id number] female here for a routine annual gynecologic exam.  Current complaints: vaginal irritation and dryness, especially during intercourse.  Also perceived abnormal odor sometimes during intercourse, wants vaginitis evaluation.  Reports recurrent BV and yeast.   Denies abnormal vaginal bleeding, discharge, pelvic pain, problems with intercourse or other gynecologic concerns.    Gynecologic History Patient's last menstrual period was 08/28/2023 (approximate). Contraception: OCP (estrogen/progesterone), wants refill Last Pap: 02/28/2022. Result was abnormal with LGSIL   Obstetric History OB History  Gravida Para Term Preterm AB Living  1 0 0 0 1 0  SAB IAB Ectopic Multiple Live Births  1 0 0 0 0    # Outcome Date GA Lbr Len/2nd Weight Sex Type Anes PTL Lv  1 SAB 06/2022            Past Medical History:  Diagnosis Date   HSV (herpes simplex virus) infection     History reviewed. No pertinent surgical history.  Current Outpatient Medications on File Prior to Visit  Medication Sig Dispense Refill   ISOtretinoin  (ABSORICA ) 30 MG capsule Take 1 capsule (30 mg total) by mouth 2 (two) times daily. 60 capsule 0   spironolactone  (ALDACTONE ) 50 MG tablet TAKE 1 TO 2 TABLETS BY MOUTH DAILY. 180 tablet 0   clotrimazole -betamethasone  (LOTRISONE ) cream Apply to affected area 2 times daily prn 90 g 0   ISOtretinoin  (ABSORICA ) 20 MG capsule Take 1 capsule (20 mg total) by mouth daily. 14 capsule 0   ISOtretinoin  (ABSORICA ) 40 MG capsule Take 1 capsule (40 mg total) by mouth daily. 30 capsule 0   ISOtretinoin  (ABSORICA ) 40 MG capsule Take 1 capsule (40 mg total) by mouth daily. 30 capsule 0   promethazine -dextromethorphan (PROMETHAZINE -DM) 6.25-15 MG/5ML syrup Take 5 mLs by mouth 4 (four) times daily as needed for cough. 118 mL 0   [DISCONTINUED] cetirizine  (ZYRTEC ) 10 MG tablet Take 1 tablet (10  mg total) by mouth daily. For 3 days 3 tablet 0   [DISCONTINUED] ipratropium (ATROVENT ) 0.06 % nasal spray Place 2 sprays into both nostrils 4 (four) times daily. (Patient not taking: Reported on 09/06/2016) 15 mL 0   No current facility-administered medications on file prior to visit.    Allergies  Allergen Reactions   Cashew Nut Oil Hives, Itching and Swelling   Almond Oil    Bactrim  [Sulfamethoxazole -Trimethoprim ] Itching   Social History:  reports that she has never smoked. She has never used smokeless tobacco. She reports current alcohol use. She reports that she does not currently use drugs.  Family History  Problem Relation Age of Onset   Cancer Mother    Hypertension Father    Hypertension Paternal Grandfather    Hypertension Paternal Grandmother    Diabetes Maternal Grandmother    Diabetes Maternal Grandfather    Hypertension Paternal Aunt    Multiple sclerosis Paternal Aunt    The following portions of the patient's history were reviewed and updated as appropriate: allergies, current medications, past family history, past medical history, past social history, past surgical history and problem list.  Review of Systems Pertinent items noted in HPI and remainder of comprehensive ROS otherwise negative.  Physical Exam:  BP (!) 155/104   Pulse (!) 59   Wt 159 lb (72.1 kg)   LMP 08/28/2023 (Approximate)   BMI 31.05 kg/m  Vitals:   09/18/23 1520 09/18/23 1601  BP: (!) 155/104 Aaron Aas)  162/99   CONSTITUTIONAL: Well-developed, well-nourished female in no acute distress.  HENT:  Normocephalic, atraumatic, External right and left ear normal.  EYES: Conjunctivae and EOM are normal. Pupils are equal, round, and reactive to light. No scleral icterus.  NECK: Normal range of motion, supple, no masses observed. SKIN: Skin is warm and dry. No rash noted. Not diaphoretic. No erythema. No pallor. MUSCULOSKELETAL: Normal range of motion. No tenderness.  No cyanosis, clubbing, or  edema. NEUROLOGIC: Alert and oriented to person, place, and time. Normal muscle tone coordination.  PSYCHIATRIC: Normal mood and affect. Normal behavior. Normal judgment and thought content. CARDIOVASCULAR: Normal heart rate noted, regular rhythm RESPIRATORY: Clear to auscultation bilaterally. Effort and breath sounds normal, no problems with respiration noted. BREASTS: Symmetric in size. No masses, tenderness, skin changes, nipple drainage, or lymphadenopathy bilaterally. Performed in the presence of a chaperone. ABDOMEN: Soft, no distention noted.  No tenderness, rebound or guarding.  PELVIC: Normal appearing external genitalia and urethral meatus; normal appearing vaginal mucosa and cervix.  Copious white vaginal discharge noted, testing sample obtained.  Pap smear obtained.  Normal uterine size, no other palpable masses, no uterine or adnexal tenderness.  Performed in the presence of a chaperone.  Results for orders placed or performed in visit on 09/18/23 (from the past 24 hours)  POCT Urinalysis Dipstick     Status: None   Collection Time: 09/18/23  3:56 PM  Result Value Ref Range   Color, UA     Clarity, UA     Glucose, UA     Bilirubin, UA     Ketones, UA Moderate    Spec Grav, UA     Blood, UA Trace    pH, UA     Protein, UA     Urobilinogen, UA     Nitrite, UA     Leukocytes, UA     Appearance     Odor       Assessment and Plan:     1. Vulvar irritation - POCT Urinalysis Dipstick equivocal - Urine Culture sent, will follow up results and manage accordingly. - Cervicovaginal ancillary only done, will follow up results and manage accordingly. Proper vulvar hygiene emphasized: discussed avoidance of perfumed soaps, detergents, lotions and any type of douches; in addition to wearing cotton underwear and no underwear at night.  Also recommended cleaning front to back, voiding and cleaning up after intercourse.   2. Hypertension, essential Elevated BPs noted, advised to  follow up with PCP. Changed OCPs from Sprintec to Slynd.  3. Uses oral contraceptives Slynd prescribed, discussed risk of irregular bleeding during first cycles.   Also discussed need to avoid estrogen for now with her very high BPs.  - Drospirenone (SLYND) 4 MG TABS; Take 1 tablet (4 mg total) by mouth daily.  Dispense: 28 tablet; Refill: 2  4. Well woman exam with routine gynecological exam (Primary) - Cytology - PAP Will follow up results of pap smear and manage accordingly. Normal breast examination today, she was advised to perform periodic self breast examinations.  Routine preventative health maintenance measures emphasized. Please refer to After Visit Summary for other counseling recommendations.      Lenoard Rad, MD, FACOG Obstetrician & Gynecologist, [facility] for [employer], [facility]

## 2023-09-20 ENCOUNTER — Ambulatory Visit: Admitting: Dermatology

## 2023-09-20 ENCOUNTER — Encounter: Payer: Self-pay | Admitting: Obstetrics & Gynecology

## 2023-09-20 LAB — CERVICOVAGINAL ANCILLARY ONLY
Bacterial Vaginitis (gardnerella): POSITIVE — AB
Candida Glabrata: NEGATIVE
Candida Vaginitis: NEGATIVE
Chlamydia: NEGATIVE
Comment: NEGATIVE
Comment: NEGATIVE
Comment: NEGATIVE
Comment: NEGATIVE
Comment: NEGATIVE
Comment: NORMAL
Neisseria Gonorrhea: NEGATIVE
Trichomonas: NEGATIVE

## 2023-09-20 LAB — URINE CULTURE

## 2023-09-21 ENCOUNTER — Encounter: Payer: Self-pay | Admitting: Obstetrics & Gynecology

## 2023-09-21 MED ORDER — METRONIDAZOLE 500 MG PO TABS
500.0000 mg | ORAL_TABLET | Freq: Two times a day (BID) | ORAL | 0 refills | Status: AC
Start: 2023-09-21 — End: 2023-09-28

## 2023-09-21 NOTE — Addendum Note (Signed)
 Addended by: Lenoard Rad A on: 09/21/2023 08:04 AM   Modules accepted: Orders

## 2023-09-25 LAB — CYTOLOGY - PAP
Comment: NEGATIVE
Comment: NEGATIVE
Comment: NEGATIVE
Diagnosis: NEGATIVE
HPV 16: NEGATIVE
HPV 18 / 45: NEGATIVE
High risk HPV: POSITIVE — AB

## 2023-09-26 ENCOUNTER — Encounter: Payer: Self-pay | Admitting: Obstetrics & Gynecology

## 2023-09-27 ENCOUNTER — Ambulatory Visit: Admitting: Dermatology

## 2023-10-04 ENCOUNTER — Ambulatory Visit: Payer: Self-pay | Admitting: Dermatology

## 2023-10-18 ENCOUNTER — Ambulatory Visit: Payer: Self-pay | Admitting: Dermatology

## 2023-10-22 ENCOUNTER — Ambulatory Visit: Admitting: Dermatology

## 2023-11-16 ENCOUNTER — Ambulatory Visit
Admission: EM | Admit: 2023-11-16 | Discharge: 2023-11-16 | Disposition: A | Discharge status: Home / Self Care | Payer: Self-pay | Attending: Family Medicine | Admitting: Family Medicine

## 2023-11-16 DIAGNOSIS — B9689 Other specified bacterial agents as the cause of diseases classified elsewhere: Secondary | ICD-10-CM

## 2023-11-16 DIAGNOSIS — J329 Chronic sinusitis, unspecified: Secondary | ICD-10-CM

## 2023-11-16 MED ORDER — FLUTICASONE PROPIONATE 50 MCG/ACT NA SUSP
1.0000 | Freq: Every day | NASAL | 0 refills | Status: AC
Start: 2023-11-16 — End: ?

## 2023-11-16 MED ORDER — AMOXICILLIN-POT CLAVULANATE 875-125 MG PO TABS: 1.0000 | ORAL_TABLET | Freq: Two times a day (BID) | ORAL | 0 refills | Status: DC

## 2023-11-16 NOTE — ED Provider Notes (Addendum)
 [facility]    CSN: [id number] Arrival date & time: 11/16/23  9075      History   Chief Complaint No chief complaint on file.   HPI Sharon Steele is a 23 y.o. female  presents for evaluation of URI symptoms for 10 days. Patient reports associated symptoms of right-sided sinus pressure/pain purulent nasal discharge, chills/low-grade fever. Denies N/V/D, cough, sore throat, ear pain, body aches, shortness of breath. Patient does note have a hx of asthma. Patient is not an active smoker.   Reports no sick contacts.  Pt has taken DayQuil/NyQuil and Nettie pot OTC for symptoms.  Patient denies pregnancy or breast-feeding. pt has no other concerns at this time.   HPI  Past Medical History:  Diagnosis Date   HSV (herpes simplex virus) infection     Patient Active Problem List   Diagnosis Date Noted   LGSIL on Pap smear of cervix 08/05/2020   Menorrhagia with regular cycle 03/18/2020   Herpes simplex vulvovaginitis 03/18/2020   Hidradenitis 03/18/2020    History reviewed. No pertinent surgical history.  OB History     Gravida  1   Para  0   Term  0   Preterm  0   AB  1   Living  0      SAB  1   IAB  0   Ectopic  0   Multiple  0   Live Births  0            Home Medications    Prior to Admission medications   Medication Sig Start Date End Date Taking? Authorizing Provider  amoxicillin -clavulanate (AUGMENTIN ) 875-125 MG tablet Take 1 tablet by mouth every 12 (twelve) hours. 11/16/23  Yes Hilarie Sinha, Jodi R, NP  fluticasone (FLONASE) 50 MCG/ACT nasal spray Place 1 spray into both nostrils daily. 11/16/23  Yes Livia Tarr, Jodi R, NP  clotrimazole -betamethasone  (LOTRISONE ) cream Apply to affected area 2 times daily prn 06/25/23   Arloa Suzen RAMAN, NP  Drospirenone  (SLYND ) 4 MG TABS Take 1 tablet (4 mg total) by mouth daily. 09/18/23   Anyanwu, Ugonna A, MD  ISOtretinoin  (ABSORICA ) 20 MG capsule Take 1 capsule (20 mg total) by mouth daily. 06/21/23   Claudene Lehmann, MD  ISOtretinoin  (ABSORICA ) 30 MG capsule Take 1 capsule (30 mg total) by mouth 2 (two) times daily. 08/20/23   Claudene Lehmann, MD  ISOtretinoin  (ABSORICA ) 40 MG capsule Take 1 capsule (40 mg total) by mouth daily. 04/03/23   Claudene Lehmann, MD  ISOtretinoin  (ABSORICA ) 40 MG capsule Take 1 capsule (40 mg total) by mouth daily. 05/09/23   Claudene Lehmann, MD  promethazine -dextromethorphan (PROMETHAZINE -DM) 6.25-15 MG/5ML syrup Take 5 mLs by mouth 4 (four) times daily as needed for cough. 05/24/23   Audiel Scheiber, Jodi R, NP  spironolactone  (ALDACTONE ) 50 MG tablet TAKE 1 TO 2 TABLETS BY MOUTH DAILY. 08/20/23   Claudene Lehmann, MD  cetirizine  (ZYRTEC ) 10 MG tablet Take 1 tablet (10 mg total) by mouth daily. For 3 days 09/06/16 11/24/19  Deis, Jamie, MD  ipratropium (ATROVENT ) 0.06 % nasal spray Place 2 sprays into both nostrils 4 (four) times daily. Patient not taking: Reported on 09/06/2016 07/04/16 11/24/19  Pennie Elsie PARAS, FNP    Family History Family History  Problem Relation Age of Onset   Cancer Mother    Hypertension Father    Hypertension Paternal Grandfather    Hypertension Paternal Grandmother    Diabetes Maternal Grandmother    Diabetes Maternal Grandfather  Hypertension Paternal Aunt    Multiple sclerosis Paternal Aunt     Social History Social History   Tobacco Use   Smoking status: Never   Smokeless tobacco: Never  Vaping Use   Vaping status: Never Used  Substance Use Topics   Alcohol use: Yes   Drug use: Not Currently     Allergies   Cashew nut oil, Almond oil, and Bactrim  [sulfamethoxazole -trimethoprim ]   Review of Systems Review of Systems  Constitutional:  Positive for chills and fever.  HENT:  Positive for congestion, sinus pressure and sinus pain.      Physical Exam Triage Vital Signs ED Triage Vitals  Encounter Vitals Group     BP 11/16/23 0933 124/82     Girls Systolic BP Percentile --      Girls Diastolic BP Percentile --       Boys Systolic BP Percentile --      Boys Diastolic BP Percentile --      Pulse Rate 11/16/23 0933 (!) 111     Resp 11/16/23 0933 17     Temp 11/16/23 0933 99.6 F (37.6 C)     Temp Source 11/16/23 0933 Oral     SpO2 11/16/23 0933 96 %     Weight --      Height --      Head Circumference --      Peak Flow --      Pain Score 11/16/23 0932 4     Pain Loc --      Pain Education --      Exclude from Growth Chart --    No data found.  Updated Vital Signs BP 124/82 (BP Location: Left Arm)   Pulse (!) 111   Temp 99.6 F (37.6 C) (Oral)   Resp 17   LMP 10/13/2023 (Exact Date)   SpO2 96%   Visual Acuity Right Eye Distance:   Left Eye Distance:   Bilateral Distance:    Right Eye Near:   Left Eye Near:    Bilateral Near:     Physical Exam Vitals and nursing note reviewed.  Constitutional:      General: She is not in acute distress.    Appearance: She is well-developed. She is not ill-appearing.  HENT:     Head: Normocephalic and atraumatic.     Right Ear: Tympanic membrane and ear canal normal.     Left Ear: Tympanic membrane and ear canal normal.     Nose: Congestion present.     Right Turbinates: Swollen and pale.     Left Turbinates: Not swollen or pale.     Right Sinus: Maxillary sinus tenderness present. No frontal sinus tenderness.     Left Sinus: No maxillary sinus tenderness or frontal sinus tenderness.     Mouth/Throat:     Mouth: Mucous membranes are moist.     Pharynx: Oropharynx is clear. Uvula midline. No posterior oropharyngeal erythema.     Tonsils: No tonsillar exudate or tonsillar abscesses.   Eyes:     Conjunctiva/sclera: Conjunctivae normal.     Pupils: Pupils are equal, round, and reactive to light.    Cardiovascular:     Rate and Rhythm: Normal rate and regular rhythm.     Heart sounds: Normal heart sounds.  Pulmonary:     Effort: Pulmonary effort is normal.     Breath sounds: Normal breath sounds. No wheezing, rhonchi or rales.    Musculoskeletal:     Cervical back: Normal range of motion  and neck supple.  Lymphadenopathy:     Cervical: No cervical adenopathy.   Skin:    General: Skin is warm and dry.   Neurological:     General: No focal deficit present.     Mental Status: She is alert and oriented to person, place, and time.   Psychiatric:        Mood and Affect: Mood normal.        Behavior: Behavior normal.      UC Treatments / Results  Labs (all labs ordered are listed, but only abnormal results are displayed) Labs Reviewed - No data to display  EKG   Radiology No results found.  Procedures Procedures (including critical care time)  Medications Ordered in UC Medications - No data to display  Initial Impression / Assessment and Plan / UC Course  I have reviewed the triage vital signs and the nursing notes.  Pertinent labs & imaging results that were available during my care of the patient were reviewed by me and considered in my medical decision making (see chart for details).     Reviewed exam and symptoms with patient.  No red flags.  Start Augmentin  and Flonase.  May continue nasal rinses and OTC NyQuil or DayQuil as needed.  Encourage rest fluids and PCP follow-up if symptoms do not improve.  ER precautions reviewed. Final Clinical Impressions(s) / UC Diagnoses   Final diagnoses:  Sinusitis, bacterial     Discharge Instructions      Start Augmentin  twice daily for 7 days.  Flonase daily.  You may continue Nettie Potts/nasal rinses as needed.  Lots of rest and fluids.  You may follow-up with your PCP if your symptoms do not improve.  Please go to the ER for any worsening symptoms.  Hope you feel better soon!   ED Prescriptions     Medication Sig Dispense Auth. Provider   amoxicillin -clavulanate (AUGMENTIN ) 875-125 MG tablet Take 1 tablet by mouth every 12 (twelve) hours. 14 tablet Cindi Ghazarian, Jodi R, NP   fluticasone (FLONASE) 50 MCG/ACT nasal spray Place 1 spray into both  nostrils daily. 15.8 mL Deissy Guilbert, Jodi R, NP      PDMP not reviewed this encounter.   Loreda Myla SAUNDERS, NP 11/16/23 0950    Loreda Myla SAUNDERS, NP 11/16/23 [phone]

## 2023-11-16 NOTE — ED Triage Notes (Signed)
 Pt present with c/o possible sinus infection. Pt states she has lymph node swelling and sore throat. Pt is experiencing sinus pressure and drainage x 3 weeks. Has been taking Nyquil and felt throat relief.

## 2023-11-16 NOTE — Discharge Instructions (Addendum)
 Start Augmentin  twice daily for 7 days.  Flonase daily.  You may continue Nettie Potts/nasal rinses as needed.  Lots of rest and fluids.  You may follow-up with your PCP if your symptoms do not improve.  Please go to the ER for any worsening symptoms.  Hope you feel better soon!

## 2023-11-20 ENCOUNTER — Ambulatory Visit: Admitting: Dermatology

## 2023-11-28 ENCOUNTER — Ambulatory Visit (INDEPENDENT_AMBULATORY_CARE_PROVIDER_SITE_OTHER): Payer: Self-pay

## 2023-11-28 ENCOUNTER — Other Ambulatory Visit (HOSPITAL_COMMUNITY)
Admission: RE | Admit: 2023-11-28 | Discharge: 2023-11-28 | Disposition: A | Discharge status: Home / Self Care | Payer: Self-pay | Source: Ambulatory Visit | Attending: Obstetrics & Gynecology | Admitting: Obstetrics & Gynecology

## 2023-11-28 DIAGNOSIS — N898 Other specified noninflammatory disorders of vagina: Secondary | ICD-10-CM

## 2023-11-28 DIAGNOSIS — Z113 Encounter for screening for infections with a predominantly sexual mode of transmission: Secondary | ICD-10-CM

## 2023-11-28 NOTE — Progress Notes (Signed)
 SUBJECTIVE:  23 y.o. female who desires a STI screen. Denies abnormal vaginal discharge, bleeding or significant pelvic pain. No UTI symptoms. Denies history of known exposure to STD.  Patient's last menstrual period was 10/13/2023 (exact date).  OBJECTIVE:  She appears well.   ASSESSMENT:  STI Screen   PLAN:  Pt offered STI blood screening-Pt request  GC, chlamydia, and trichomonas probe sent to lab.  Treatment: To be determined once lab results are received.  Pt follow up as needed.

## 2023-11-29 ENCOUNTER — Ambulatory Visit: Payer: Self-pay | Admitting: Obstetrics & Gynecology

## 2023-11-29 DIAGNOSIS — B9689 Other specified bacterial agents as the cause of diseases classified elsewhere: Secondary | ICD-10-CM

## 2023-11-29 LAB — RPR+HBSAG+HCVAB+...
HIV Screen 4th Generation wRfx: NONREACTIVE
Hep C Virus Ab: NONREACTIVE
Hepatitis B Surface Ag: NEGATIVE
RPR Ser Ql: NONREACTIVE

## 2023-11-30 LAB — CERVICOVAGINAL ANCILLARY ONLY
Bacterial Vaginitis (gardnerella): POSITIVE — AB
Candida Glabrata: NEGATIVE
Candida Vaginitis: NEGATIVE
Chlamydia: NEGATIVE
Comment: NEGATIVE
Comment: NEGATIVE
Comment: NEGATIVE
Comment: NEGATIVE
Comment: NEGATIVE
Comment: NORMAL
Neisseria Gonorrhea: NEGATIVE
Trichomonas: NEGATIVE

## 2023-12-01 MED ORDER — METRONIDAZOLE 500 MG PO TABS: 500.0000 mg | ORAL_TABLET | Freq: Two times a day (BID) | ORAL | 0 refills | Status: AC

## 2024-01-03 ENCOUNTER — Encounter: Payer: Self-pay | Admitting: Obstetrics and Gynecology

## 2024-01-03 DIAGNOSIS — A6004 Herpesviral vulvovaginitis: Secondary | ICD-10-CM

## 2024-01-03 MED ORDER — VALACYCLOVIR HCL 500 MG PO TABS
ORAL_TABLET | ORAL | 3 refills | Status: AC
Start: 2024-01-03 — End: ?

## 2024-02-14 ENCOUNTER — Other Ambulatory Visit (HOSPITAL_COMMUNITY)
Admission: RE | Admit: 2024-02-14 | Discharge: 2024-02-14 | Disposition: A | Discharge status: Home / Self Care | Source: Ambulatory Visit | Attending: Obstetrics & Gynecology | Admitting: Obstetrics & Gynecology

## 2024-02-14 ENCOUNTER — Ambulatory Visit (INDEPENDENT_AMBULATORY_CARE_PROVIDER_SITE_OTHER): Payer: Self-pay | Admitting: *Deleted

## 2024-02-14 DIAGNOSIS — R300 Dysuria: Secondary | ICD-10-CM | POA: Diagnosis not present

## 2024-02-14 DIAGNOSIS — N898 Other specified noninflammatory disorders of vagina: Secondary | ICD-10-CM | POA: Insufficient documentation

## 2024-02-14 NOTE — Progress Notes (Signed)
 SUBJECTIVE:  23 y.o. female complains of  vaginal discharge. dysuria Denies abnormal vaginal bleeding or significant pelvic pain or fever.Denies history of known exposure to STD.  No LMP recorded.  OBJECTIVE:  She appears alert, well appearing, in no apparent distress Urine dipstick: not done.  ASSESSMENT:  Vaginal Discharge  Dysuria    PLAN:  GC, chlamydia, trichomonas, BVAG, CVAG probe and urine culture sent to lab. Treatment: To be determined once lab results are received ROV prn if symptoms persist or worsen.

## 2024-02-18 ENCOUNTER — Ambulatory Visit: Payer: Self-pay | Admitting: Obstetrics & Gynecology

## 2024-02-18 DIAGNOSIS — B9689 Other specified bacterial agents as the cause of diseases classified elsewhere: Secondary | ICD-10-CM

## 2024-02-18 DIAGNOSIS — N3000 Acute cystitis without hematuria: Secondary | ICD-10-CM

## 2024-02-18 LAB — CERVICOVAGINAL ANCILLARY ONLY
Bacterial Vaginitis (gardnerella): POSITIVE — AB
Candida Glabrata: NEGATIVE
Candida Vaginitis: NEGATIVE
Chlamydia: NEGATIVE
Comment: NEGATIVE
Comment: NEGATIVE
Comment: NEGATIVE
Comment: NEGATIVE
Comment: NEGATIVE
Comment: NORMAL
Neisseria Gonorrhea: NEGATIVE
Trichomonas: NEGATIVE

## 2024-02-18 MED ORDER — METRONIDAZOLE 500 MG PO TABS: 500.0000 mg | ORAL_TABLET | Freq: Two times a day (BID) | ORAL | 0 refills | Status: DC

## 2024-02-19 LAB — URINE CULTURE

## 2024-02-19 MED ORDER — FLUCONAZOLE 150 MG PO TABS: 150.0000 mg | ORAL_TABLET | Freq: Once | ORAL | 3 refills | Status: AC

## 2024-02-19 MED ORDER — CLINDAMYCIN HCL 300 MG PO CAPS: 300.0000 mg | ORAL_CAPSULE | Freq: Two times a day (BID) | ORAL | 0 refills | Status: AC

## 2024-02-19 MED ORDER — CEFADROXIL 500 MG PO CAPS: 500.0000 mg | ORAL_CAPSULE | Freq: Two times a day (BID) | ORAL | 0 refills | Status: DC

## 2024-02-25 NOTE — Telephone Encounter (Signed)
 Called pt to make sure she saw results and is aware medications have been called in. Pt has already stated all the medications.

## 2024-02-25 NOTE — Telephone Encounter (Signed)
-----   Message from Gloris Hugger sent at 02/19/2024  3:10 PM EDT ----- Patient has a urinary tract infection (E.coli, pansensitive), Cefadroxil prescribed. She also wanted alternative treatment for BV, Clindamycin  prescribed. Having these antibiotics can cause a rebound yeast infection, Diflucan  presumptively ordered to be taken as needed. Results were released to MyChart and patient was given recommendations as indicated.   Ugonna Anyanwu, MD  ----- Message ----- From: Interface, [facility] Sent: 02/18/2024  12:16 PM EDT To: Gloris DELENA Hugger, MD

## 2024-04-27 ENCOUNTER — Other Ambulatory Visit: Payer: Self-pay

## 2024-04-27 ENCOUNTER — Encounter (HOSPITAL_BASED_OUTPATIENT_CLINIC_OR_DEPARTMENT_OTHER): Payer: Self-pay | Admitting: Emergency Medicine

## 2024-04-27 ENCOUNTER — Emergency Department (HOSPITAL_BASED_OUTPATIENT_CLINIC_OR_DEPARTMENT_OTHER): Admission: EM | Admit: 2024-04-27 | Discharge: 2024-04-27 | Disposition: A | Discharge status: Home / Self Care

## 2024-04-27 ENCOUNTER — Emergency Department (HOSPITAL_BASED_OUTPATIENT_CLINIC_OR_DEPARTMENT_OTHER)

## 2024-04-27 DIAGNOSIS — R110 Nausea: Secondary | ICD-10-CM | POA: Insufficient documentation

## 2024-04-27 DIAGNOSIS — R519 Headache, unspecified: Secondary | ICD-10-CM | POA: Insufficient documentation

## 2024-04-27 HISTORY — DX: Migraine, unspecified, not intractable, without status migrainosus: G43.909

## 2024-04-27 LAB — PREGNANCY, URINE: Preg Test, Ur: NEGATIVE

## 2024-04-27 MED ORDER — DIPHENHYDRAMINE HCL 50 MG/ML IJ SOLN: 50.0000 mg | Freq: Once | INTRAMUSCULAR | Status: DC

## 2024-04-27 MED ORDER — DEXAMETHASONE SOD PHOSPHATE PF 10 MG/ML IJ SOLN
10.0000 mg | Freq: Once | INTRAMUSCULAR | Status: AC
  Administered 2024-04-27: 10 mg via INTRAVENOUS

## 2024-04-27 MED ORDER — METOCLOPRAMIDE HCL 5 MG/ML IJ SOLN
10.0000 mg | Freq: Once | INTRAMUSCULAR | Status: AC
  Administered 2024-04-27: 10 mg via INTRAVENOUS
  Filled 2024-04-27: qty 2

## 2024-04-27 MED ORDER — KETOROLAC TROMETHAMINE 15 MG/ML IJ SOLN
15.0000 mg | Freq: Once | INTRAMUSCULAR | Status: AC
  Administered 2024-04-27: 15 mg via INTRAVENOUS
  Filled 2024-04-27: qty 1

## 2024-04-27 MED ORDER — BUTALBITAL-APAP-CAFFEINE 50-325-40 MG PO TABS: 1.0000 | ORAL_TABLET | Freq: Four times a day (QID) | ORAL | 0 refills | Status: AC | PRN

## 2024-04-27 NOTE — Discharge Instructions (Addendum)
 You may take Fioricet as needed for your headache.  Follow-up with your doctor for further care.  Drink plenty of fluid and stay hydrated.

## 2024-04-27 NOTE — ED Provider Notes (Signed)
 [facility] AT [facility] Provider Note   CSN: [id number] Arrival date & time: 04/27/24  1954     Patient presents with: Migraine   Sharon Steele is a 23 y.o. female.   The history is provided by the patient and medical records. No language interpreter was used.  Migraine     23 year old female with history of migraine presenting complaint of headache.  Patient states for the past 3 days she has had persistent headache not relieved despite taking her home headache medication.  Headache is described as a throbbing sensation primarily to the right side of her head with pressure behind both eyes.  She endorsed nausea, light and sound sensitivity and she also report having cold sweats.  She does not endorse any runny nose or coughing no chest pain shortness of breath no abdominal pain no focal numbness or focal weakness or rash.  Last menstruation was 2 weeks ago.  Prior to Admission medications   Medication Sig Start Date End Date Taking? Authorizing Provider  cefadroxil  (DURICEF) 500 MG capsule Take 1 capsule (500 mg total) by mouth 2 (two) times daily. 02/19/24   Anyanwu, Ugonna A, MD  clotrimazole -betamethasone  (LOTRISONE ) cream Apply to affected area 2 times daily prn 06/25/23   Arloa Suzen RAMAN, NP  Drospirenone  (SLYND ) 4 MG TABS Take 1 tablet (4 mg total) by mouth daily. 09/18/23   Anyanwu, Ugonna A, MD  fluticasone  (FLONASE ) 50 MCG/ACT nasal spray Place 1 spray into both nostrils daily. 11/16/23   Mayer, Jodi R, NP  ISOtretinoin  (ABSORICA ) 20 MG capsule Take 1 capsule (20 mg total) by mouth daily. 06/21/23   Claudene Lehmann, MD  ISOtretinoin  (ABSORICA ) 30 MG capsule Take 1 capsule (30 mg total) by mouth 2 (two) times daily. 08/20/23   Claudene Lehmann, MD  ISOtretinoin  (ABSORICA ) 40 MG capsule Take 1 capsule (40 mg total) by mouth daily. 04/03/23   Claudene Lehmann, MD  ISOtretinoin  (ABSORICA ) 40 MG capsule Take 1 capsule (40 mg total) by mouth  daily. 05/09/23   Claudene Lehmann, MD  promethazine -dextromethorphan (PROMETHAZINE -DM) 6.25-15 MG/5ML syrup Take 5 mLs by mouth 4 (four) times daily as needed for cough. 05/24/23   Mayer, Jodi R, NP  spironolactone  (ALDACTONE ) 50 MG tablet TAKE 1 TO 2 TABLETS BY MOUTH DAILY. 08/20/23   Claudene Lehmann, MD  valACYclovir  (VALTREX ) 500 MG tablet For episodes: 1 tab po bid x 3d. For daily suppression: 1 tab po qday 01/03/24   Fredirick Glenys RAMAN, MD  cetirizine  (ZYRTEC ) 10 MG tablet Take 1 tablet (10 mg total) by mouth daily. For 3 days 09/06/16 11/24/19  Deis, Jamie, MD  ipratropium (ATROVENT ) 0.06 % nasal spray Place 2 sprays into both nostrils 4 (four) times daily. Patient not taking: Reported on 09/06/2016 07/04/16 11/24/19  Pennie Elsie PARAS, FNP    Allergies: Cashew nut oil, Almond oil, and Bactrim  [sulfamethoxazole -trimethoprim ]    Review of Systems  All other systems reviewed and are negative.   Updated Vital Signs BP (!) 145/103 (BP Location: Right Arm)   Pulse (!) 104   Temp 98.6 F (37 C) (Oral)   Resp 15   Ht 5' (1.524 m)   Wt 66.7 kg   LMP 04/13/2024 (Approximate)   SpO2 97%   BMI 28.71 kg/m   Physical Exam Vitals and nursing note reviewed.  Constitutional:      General: She is not in acute distress.    Appearance: She is well-developed.  HENT:     Head: Normocephalic and  atraumatic.     Right Ear: Tympanic membrane normal.     Left Ear: Tympanic membrane normal.     Nose: Nose normal.     Mouth/Throat:     Mouth: Mucous membranes are moist.  Eyes:     Extraocular Movements: Extraocular movements intact.     Conjunctiva/sclera: Conjunctivae normal.     Pupils: Pupils are equal, round, and reactive to light.  Cardiovascular:     Rate and Rhythm: Normal rate and regular rhythm.     Pulses: Normal pulses.     Heart sounds: Normal heart sounds.  Pulmonary:     Effort: Pulmonary effort is normal.  Abdominal:     Palpations: Abdomen is soft.  Musculoskeletal:      Cervical back: Normal range of motion and neck supple. No rigidity.  Skin:    Findings: No rash.  Neurological:     Mental Status: She is alert and oriented to person, place, and time.     GCS: GCS eye subscore is 4. GCS verbal subscore is 5. GCS motor subscore is 6.     Cranial Nerves: Cranial nerves 2-12 are intact.     Sensory: Sensation is intact.     Motor: Motor function is intact.     Coordination: Coordination is intact.  Psychiatric:        Mood and Affect: Mood normal.     (all labs ordered are listed, but only abnormal results are displayed) Labs Reviewed  PREGNANCY, URINE    EKG: None  Radiology: CT Head Wo Contrast Result Date: 04/27/2024 EXAM: CT HEAD WITHOUT CONTRAST 04/27/2024 09:59:25 PM TECHNIQUE: CT of the head was performed without the administration of intravenous contrast. Automated exposure control, iterative reconstruction, and/or weight based adjustment of the mA/kV was utilized to reduce the radiation dose to as low as reasonably achievable. COMPARISON: None available. CLINICAL HISTORY: Headache, increasing frequency or severity FINDINGS: BRAIN AND VENTRICLES: No acute hemorrhage. No evidence of acute infarct. No hydrocephalus. No extra-axial collection. No mass effect or midline shift. ORBITS: No acute abnormality. SINUSES: No acute abnormality. SOFT TISSUES AND SKULL: No acute soft tissue abnormality. No skull fracture. IMPRESSION: 1. No acute intracranial abnormality. Electronically signed by: Morgane Naveau MD 04/27/2024 10:07 PM EST RP Workstation: [id number]     Procedures   Medications Ordered in the ED  ketorolac  (TORADOL ) 15 MG/ML injection 15 mg (15 mg Intravenous Given 04/27/24 2248)  metoCLOPramide  (REGLAN ) injection 10 mg (10 mg Intravenous Given 04/27/24 2253)  dexamethasone  (DECADRON ) injection 10 mg (10 mg Intravenous Given 04/27/24 2251)                                    Medical Decision Making Amount and/or Complexity of Data  Reviewed Labs: ordered. Radiology: ordered.  Risk Prescription drug management.   BP (!) 145/103 (BP Location: Right Arm)   Pulse (!) 104   Temp 98.6 F (37 C) (Oral)   Resp 15   Ht 5' (1.524 m)   Wt 66.7 kg   LMP 04/13/2024 (Approximate)   SpO2 97%   BMI 28.71 kg/m   7:110 PM 23 year old female with history of migraine presenting complaint of headache.  Patient states for the past 3 days she has had persistent headache not relieved despite taking her home headache medication.  Headache is described as a throbbing sensation primarily to the right side of her head with pressure behind both eyes.  She endorsed nausea, light and sound sensitivity and she also report having cold sweats.  She does not endorse any runny nose or coughing no chest pain shortness of breath no abdominal pain no focal numbness or focal weakness or rash.  Last menstruation was 2 weeks ago.  On exam patient is well-appearing.  She does not exhibit any nuchal rigidity concern for meningitis.  She does not exhibit any focal neurodeficit concerning for stroke or space-occupying lesion.  She is mentating appropriately.  She is afebrile.  Labs obtained and patient has negative pregnancy test.  Head CT scan obtained and reviewed inter by me and overall reassuring, agree with radiologist interpretation.  After receiving migraine cocktail patient felt better and felt comfortable going home.  She rates her pain a 0 out of 10.  Patient is stable for discharge     Final diagnoses:  Bad headache    ED Discharge Orders          Ordered    butalbital -acetaminophen -caffeine  (FIORICET) 50-325-40 MG tablet  Every 6 hours PRN        04/27/24 2341               Nivia Colon, PA-C 04/27/24 2342    Ula Prentice SAUNDERS, MD 04/27/24 2351

## 2024-04-27 NOTE — ED Triage Notes (Signed)
 Pt via POV c/o migraine x 3 days, prior hx same. No relief with OTC meds. Pt also has nausea and light sensitivity. Pain rated 10/10.

## 2024-04-30 ENCOUNTER — Telehealth: Admitting: Family Medicine

## 2024-04-30 ENCOUNTER — Ambulatory Visit: Admission: EM | Admit: 2024-04-30 | Discharge: 2024-04-30 | Disposition: A | Discharge status: Home / Self Care

## 2024-04-30 DIAGNOSIS — G43019 Migraine without aura, intractable, without status migrainosus: Secondary | ICD-10-CM | POA: Diagnosis not present

## 2024-04-30 DIAGNOSIS — R112 Nausea with vomiting, unspecified: Secondary | ICD-10-CM

## 2024-04-30 DIAGNOSIS — R1013 Epigastric pain: Secondary | ICD-10-CM

## 2024-04-30 MED ORDER — METOCLOPRAMIDE HCL 5 MG/ML IJ SOLN
10.0000 mg | Freq: Once | INTRAMUSCULAR | Status: AC
  Administered 2024-04-30: 10 mg via INTRAMUSCULAR

## 2024-04-30 MED ORDER — FAMOTIDINE 20 MG PO TABS
20.0000 mg | ORAL_TABLET | Freq: Once | ORAL | Status: AC
  Administered 2024-04-30: 20 mg via ORAL

## 2024-04-30 MED ORDER — ALUM & MAG HYDROXIDE-SIMETH 200-200-20 MG/5ML PO SUSP
30.0000 mL | Freq: Once | ORAL | Status: AC
  Administered 2024-04-30: 30 mL via ORAL

## 2024-04-30 MED ORDER — FAMOTIDINE 20 MG PO TABS: 20.0000 mg | ORAL_TABLET | Freq: Two times a day (BID) | ORAL | 0 refills | Status: AC

## 2024-04-30 MED ORDER — KETOROLAC TROMETHAMINE 60 MG/2ML IM SOLN
60.0000 mg | Freq: Once | INTRAMUSCULAR | Status: AC
  Administered 2024-04-30: 60 mg via INTRAMUSCULAR

## 2024-04-30 MED ORDER — LIDOCAINE VISCOUS HCL 2 % MT SOLN
15.0000 mL | Freq: Once | OROMUCOSAL | Status: AC
  Administered 2024-04-30: 15 mL via OROMUCOSAL

## 2024-04-30 MED ORDER — ONDANSETRON 8 MG PO TBDP: 8.0000 mg | ORAL_TABLET | Freq: Three times a day (TID) | ORAL | 0 refills | Status: AC | PRN

## 2024-04-30 NOTE — Discharge Instructions (Addendum)
 You were seen today for upper-stomach pain and nausea likely caused by stomach irritation from migraine medications. You were treated with medications in the clinic to reduce acid and nausea. Avoid NSAIDs or aspirin products such as ibuprofen , naproxen, or Goody powders. Eat small bland meals, avoid alcohol, caffeine , spicy or greasy foods, stay well hydrated, and remain upright after eating to help your symptoms improve.  You were also treated for your migraine with a Toradol  injection and plan to start Fioricet today. For continued migraine relief, rest in a dark, quiet room, drink fluids, limit caffeine , and maintain regular sleep.  Follow up with your primary care provider if stomach pain or migraines do not improve, return frequently, or interfere with daily activities. Emergency care is needed for severe or worsening abdominal pain, repeated vomiting, vomiting blood or black stools, chest pain, shortness of breath, fainting, confusion, weakness, or any sudden concerning change in your condition.

## 2024-04-30 NOTE — ED Provider Notes (Signed)
 [facility]    CSN: [id number] Arrival date & time: 04/30/24  1043      History   Chief Complaint Chief Complaint  Patient presents with   Abdominal Pain    HPI Sharon Steele is a 23 y.o. female.   Discussed the use of AI scribe software for clinical note transcription with the patient, who gave verbal consent to proceed.   The patient presents with severe abdominal pain and persistent headache following a recent emergency department visit for migraine on December 7. She reports that the headache has continued since that visit despite brief initial improvement. Yesterday evening around 6:00 PM, the migraine returned, and she took an over-the-counter migraine relief medication because she was unable to obtain the prescribed Fioricet due to insurance coverage issues.  She states that at approximately 4:00 AM this morning she developed acute abdominal pain localized to the periumbilical and epigastric regions, describing the pain as sharp, burning, pressing, and squeezing in character. She rates the abdominal pain as 9/10 and the headache as 8/10. The abdominal pain is so severe that she is unable to lie flat, sit comfortably, or ambulate without significant discomfort, noting that standing is slightly more tolerable. She reports associated nausea with one episode of vomiting after attempting to drink tea. She has not eaten today and took Gaviscon in an attempt to coat and soothe her stomach.  The patient denies fever, urinary symptoms, diarrhea, constipation, hematemesis, hematochezia, or pelvic pain. She reports experiencing similar gastric pain in the past after taking Goody Powder. She notes that her stomach discomfort began the day after receiving medications during her recent ED visit and wonders if this may represent ulcer development. She describes this migraine episode as more prolonged and severe than her typical migraines earlier this year and reports working from home  on a computer, which she believes may contribute to symptom persistence. Her last menstrual period was approximately two weeks ago. She is currently on birth control and reports a negative pregnancy test during her recent emergency department visit.  The following sections of the patient's history were reviewed and updated as appropriate: allergies, current medications, past family history, past medical history, past social history, past surgical history, and problem list.        Past Medical History:  Diagnosis Date   HSV (herpes simplex virus) infection    Migraine     Patient Active Problem List   Diagnosis Date Noted   LGSIL on Pap smear of cervix 08/05/2020   Menorrhagia with regular cycle 03/18/2020   Herpes simplex vulvovaginitis 03/18/2020   Hidradenitis 03/18/2020    History reviewed. No pertinent surgical history.  OB History     Gravida  1   Para  0   Term  0   Preterm  0   AB  1   Living  0      SAB  1   IAB  0   Ectopic  0   Multiple  0   Live Births  0            Home Medications    Prior to Admission medications   Medication Sig Start Date End Date Taking? Authorizing Provider  aluminum hydroxide-magnesium carbonate (GAVISCON) 95-358 MG/15ML SUSP Take by mouth.   Yes [provider]  famotidine  (PEPCID ) 20 MG tablet Take 1 tablet (20 mg total) by mouth 2 (two) times daily. 04/30/24  Yes Niana Martorana, FNP  omeprazole (PRILOSEC OTC) 20 MG tablet Take  20 mg by mouth daily.   Yes [provider]  ondansetron  (ZOFRAN -ODT) 8 MG disintegrating tablet Take 1 tablet (8 mg total) by mouth every 8 (eight) hours as needed for nausea or vomiting. 04/30/24  Yes Iola Lukes, FNP  butalbital -acetaminophen -caffeine  (FIORICET) 50-325-40 MG tablet Take 1-2 tablets by mouth every 6 (six) hours as needed for headache. 04/27/24 04/27/25  Nivia Colon, PA-C  clotrimazole -betamethasone  (LOTRISONE ) cream Apply to affected area 2 times  daily prn 06/25/23   Arloa Suzen RAMAN, NP  Drospirenone  (SLYND ) 4 MG TABS Take 1 tablet (4 mg total) by mouth daily. 09/18/23   Anyanwu, Ugonna A, MD  fluticasone  (FLONASE ) 50 MCG/ACT nasal spray Place 1 spray into both nostrils daily. 11/16/23   Mayer, Jodi R, NP  ISOtretinoin  (ABSORICA ) 20 MG capsule Take 1 capsule (20 mg total) by mouth daily. 06/21/23   Claudene Lehmann, MD  ISOtretinoin  (ABSORICA ) 30 MG capsule Take 1 capsule (30 mg total) by mouth 2 (two) times daily. 08/20/23   Claudene Lehmann, MD  ISOtretinoin  (ABSORICA ) 40 MG capsule Take 1 capsule (40 mg total) by mouth daily. 04/03/23   Claudene Lehmann, MD  ISOtretinoin  (ABSORICA ) 40 MG capsule Take 1 capsule (40 mg total) by mouth daily. 05/09/23   Claudene Lehmann, MD  promethazine -dextromethorphan (PROMETHAZINE -DM) 6.25-15 MG/5ML syrup Take 5 mLs by mouth 4 (four) times daily as needed for cough. 05/24/23   Mayer, Jodi R, NP  spironolactone  (ALDACTONE ) 50 MG tablet TAKE 1 TO 2 TABLETS BY MOUTH DAILY. 08/20/23   Claudene Lehmann, MD  valACYclovir  (VALTREX ) 500 MG tablet For episodes: 1 tab po bid x 3d. For daily suppression: 1 tab po qday 01/03/24   Fredirick Glenys RAMAN, MD  cetirizine  (ZYRTEC ) 10 MG tablet Take 1 tablet (10 mg total) by mouth daily. For 3 days 09/06/16 11/24/19  Deis, Jamie, MD  ipratropium (ATROVENT ) 0.06 % nasal spray Place 2 sprays into both nostrils 4 (four) times daily. Patient not taking: Reported on 09/06/2016 07/04/16 11/24/19  Pennie Elsie PARAS, FNP    Family History Family History  Problem Relation Age of Onset   Cancer Mother    Hypertension Father    Hypertension Paternal Grandfather    Hypertension Paternal Grandmother    Diabetes Maternal Grandmother    Diabetes Maternal Grandfather    Hypertension Paternal Aunt    Multiple sclerosis Paternal Aunt     Social History Social History   Tobacco Use   Smoking status: Never   Smokeless tobacco: Never  Vaping Use   Vaping status: Never Used   Substance Use Topics   Alcohol use: Yes    Comment: Occa   Drug use: Never     Allergies   Cashew nut oil, Almond oil, and Bactrim  [sulfamethoxazole -trimethoprim ]   Review of Systems Review of Systems  Constitutional:  Negative for fever.  Gastrointestinal:  Positive for abdominal pain, nausea and vomiting. Negative for blood in stool, constipation and diarrhea.  Genitourinary:  Negative for dysuria, menstrual problem (LMP 2 weeks ago), pelvic pain and vaginal discharge.  Neurological:  Positive for headaches.  All other systems reviewed and are negative.    Physical Exam Triage Vital Signs ED Triage Vitals [04/30/24 1119]  Encounter Vitals Group     BP 137/81     Girls Systolic BP Percentile      Girls Diastolic BP Percentile      Boys Systolic BP Percentile      Boys Diastolic BP Percentile      Pulse Rate 89  Resp 16     Temp 98.8 F (37.1 C)     Temp Source Oral     SpO2 95 %     Weight      Height      Head Circumference      Peak Flow      Pain Score      Pain Loc      Pain Education      Exclude from Growth Chart    No data found.  Updated Vital Signs BP 137/81 (BP Location: Right Arm)   Pulse 89   Temp 98.8 F (37.1 C) (Oral)   Resp 16   LMP  (Within Weeks) Comment: 2 weeks  SpO2 95%   Visual Acuity Right Eye Distance:   Left Eye Distance:   Bilateral Distance:    Right Eye Near:   Left Eye Near:    Bilateral Near:     Physical Exam Vitals reviewed.  Constitutional:      General: She is awake. She is not in acute distress.    Appearance: Normal appearance. She is well-developed. She is not ill-appearing, toxic-appearing or diaphoretic.  HENT:     Head: Normocephalic.     Right Ear: Hearing normal.     Left Ear: Hearing normal.     Nose: Nose normal.     Mouth/Throat:     Mouth: Mucous membranes are moist.  Eyes:     General: Vision grossly intact.     Extraocular Movements: Extraocular movements intact.     Right eye: Normal  extraocular motion and no nystagmus.     Left eye: Normal extraocular motion and no nystagmus.     Conjunctiva/sclera: Conjunctivae normal.     Pupils: Pupils are equal, round, and reactive to light.  Neck:     Trachea: Trachea normal.  Cardiovascular:     Rate and Rhythm: Normal rate.     Heart sounds: Normal heart sounds.  Pulmonary:     Effort: Pulmonary effort is normal.     Breath sounds: Normal breath sounds.  Abdominal:     General: Bowel sounds are normal. There is no distension.     Palpations: Abdomen is soft.     Tenderness: There is abdominal tenderness in the epigastric area. There is no right CVA tenderness, left CVA tenderness, guarding or rebound.  Musculoskeletal:        General: Normal range of motion.     Cervical back: Full passive range of motion without pain, normal range of motion and neck supple. No rigidity or tenderness.  Skin:    General: Skin is warm and dry.  Neurological:     General: No focal deficit present.     Mental Status: She is alert and oriented to person, place, and time.     Cranial Nerves: No cranial nerve deficit.     Sensory: Sensation is intact. No sensory deficit.     Motor: Motor function is intact. No weakness.     Coordination: Coordination is intact.     Gait: Gait is intact.  Psychiatric:        Behavior: Behavior is cooperative.      UC Treatments / Results  Labs (all labs ordered are listed, but only abnormal results are displayed) Labs Reviewed - No data to display  EKG   Radiology No results found.  Procedures Procedures (including critical care time)  Medications Ordered in UC Medications  alum & mag hydroxide-simeth (MAALOX/MYLANTA) 200-200-20 MG/5ML suspension 30  mL (30 mLs Oral Given 04/30/24 1157)  lidocaine  (XYLOCAINE ) 2 % viscous mouth solution 15 mL (15 mLs Mouth/Throat Given 04/30/24 1157)  famotidine  (PEPCID ) tablet 20 mg (20 mg Oral Given 04/30/24 1157)  metoCLOPramide  (REGLAN ) injection 10 mg (10  mg Intramuscular Given 04/30/24 1157)  ketorolac  (TORADOL ) injection 60 mg (60 mg Intramuscular Given 04/30/24 1157)    Initial Impression / Assessment and Plan / UC Course  I have reviewed the triage vital signs and the nursing notes.  Pertinent labs & imaging results that were available during my care of the patient were reviewed by me and considered in my medical decision making (see chart for details).     The patient presents with acute severe epigastric pain that began at approximately 4:00 AM, described as sharp, burning, and squeezing, occurring about 10 hours after taking over-the-counter migraine medication. Symptoms are associated with nausea and one episode of vomiting after drinking tea, without fever, urinary symptoms, or evidence of gastrointestinal bleeding. She has a history of similar pain related to use of NSAID-containing products, making the presentation most consistent with medication-induced gastritis or possible peptic ulcer disease. In clinic, a GI cocktail and oral famotidine  were administered along with intramuscular metoclopramide  for symptom relief. She had taken Gaviscon prior to arrival. The patient was advised to avoid NSAIDs and other stomach-irritating medications and to follow up with her primary care provider or gastroenterology if pain persists, recurs, or fails to improve.   The patient also reports persistent migraine headache following an emergency department visit on December 7 with only temporary relief and inability to obtain the originally prescribed Fioricet due to insurance issues. Toradol  was administered in clinic for acute headache management, and the patient plans to obtain the Fioricet prescription out of pocket today for ongoing treatment. She was advised to follow up with her primary care provider or neurology for further migraine management if headaches remain frequent or difficult to control.  She was instructed to seek emergency evaluation for  worsening or severe abdominal pain, repeated or uncontrolled vomiting, vomiting blood or black stools, high fever, chest pain, shortness of breath, neurological symptoms such as weakness or confusion, or any sudden or concerning change in her condition.  Today's evaluation has revealed no signs of a dangerous process. Discussed diagnosis with patient and/or guardian. Patient and/or guardian aware of their diagnosis, possible red flag symptoms to watch out for and need for close follow up. Patient and/or guardian understands verbal and written discharge instructions. Patient and/or guardian comfortable with plan and disposition.  Patient and/or guardian has a clear mental status at this time, good insight into illness (after discussion and teaching) and has clear judgment to make decisions regarding their care  Documentation was completed with the aid of voice recognition software. Transcription may contain typographical errors.  Final Clinical Impressions(s) / UC Diagnoses   Final diagnoses:  Intractable migraine without aura and without status migrainosus  Epigastric pain  Nausea and vomiting, unspecified vomiting type     Discharge Instructions      You were seen today for upper-stomach pain and nausea likely caused by stomach irritation from migraine medications. You were treated with medications in the clinic to reduce acid and nausea. Avoid NSAIDs or aspirin products such as ibuprofen , naproxen, or Goody powders. Eat small bland meals, avoid alcohol, caffeine , spicy or greasy foods, stay well hydrated, and remain upright after eating to help your symptoms improve.  You were also treated for your migraine with a Toradol  injection and  plan to start Fioricet today. For continued migraine relief, rest in a dark, quiet room, drink fluids, limit caffeine , and maintain regular sleep.  Follow up with your primary care provider if stomach pain or migraines do not improve, return frequently, or  interfere with daily activities. Emergency care is needed for severe or worsening abdominal pain, repeated vomiting, vomiting blood or black stools, chest pain, shortness of breath, fainting, confusion, weakness, or any sudden concerning change in your condition.      ED Prescriptions     Medication Sig Dispense Auth. Provider   famotidine  (PEPCID ) 20 MG tablet Take 1 tablet (20 mg total) by mouth 2 (two) times daily. 30 tablet Iola Lukes, FNP   ondansetron  (ZOFRAN -ODT) 8 MG disintegrating tablet Take 1 tablet (8 mg total) by mouth every 8 (eight) hours as needed for nausea or vomiting. 12 tablet Iola Lukes, FNP      PDMP not reviewed this encounter.   Iola Lukes, [place] 04/30/24 [id number]

## 2024-04-30 NOTE — Progress Notes (Signed)
 Sharon Steele   Patient went in person UC and never followed up.

## 2024-04-30 NOTE — ED Triage Notes (Signed)
 Pt reports abdominal pain since this morning after she took acetaminophen  with aspirin and caffeine  for migraine, pt think she may have an ulcer as she had Toradol  at the ED  on 04/27/24. Gasviscon and Prilosec gives some relief.

## 2024-05-20 ENCOUNTER — Ambulatory Visit

## 2024-05-20 ENCOUNTER — Other Ambulatory Visit (HOSPITAL_COMMUNITY)
Admission: RE | Admit: 2024-05-20 | Discharge: 2024-05-20 | Disposition: A | Discharge status: Home / Self Care | Source: Ambulatory Visit | Attending: Family Medicine | Admitting: Family Medicine

## 2024-05-20 DIAGNOSIS — N898 Other specified noninflammatory disorders of vagina: Secondary | ICD-10-CM

## 2024-05-20 NOTE — Progress Notes (Signed)
 SUBJECTIVE:  23 y.o. female who desires a STI screen. Complains of foul odor and white thick discharge. Denies itchiness or irritation. Denies history of known exposure to STD.  Patient's last menstrual period was 04/13/2024 (approximate).  OBJECTIVE:  She appears well.   ASSESSMENT:  STI Screen   PLAN:  Pt offered STI blood screening-declined GC, chlamydia, yeast, bacteria vaginosis, and trichomonas probe sent to lab.  Treatment: To be determined once lab results are received.  Pt follow up as needed.

## 2024-05-21 ENCOUNTER — Encounter

## 2024-05-21 LAB — CERVICOVAGINAL ANCILLARY ONLY
Bacterial Vaginitis (gardnerella): POSITIVE — AB
Candida Glabrata: NEGATIVE
Candida Vaginitis: NEGATIVE
Chlamydia: POSITIVE — AB
Comment: NEGATIVE
Comment: NEGATIVE
Comment: NEGATIVE
Comment: NEGATIVE
Comment: NEGATIVE
Comment: NORMAL
Neisseria Gonorrhea: NEGATIVE
Trichomonas: NEGATIVE

## 2024-05-23 ENCOUNTER — Ambulatory Visit: Payer: Self-pay | Admitting: Family Medicine

## 2024-05-23 DIAGNOSIS — A749 Chlamydial infection, unspecified: Secondary | ICD-10-CM

## 2024-05-23 DIAGNOSIS — N760 Acute vaginitis: Secondary | ICD-10-CM

## 2024-05-23 MED ORDER — AZITHROMYCIN 500 MG PO TABS: 1000.0000 mg | ORAL_TABLET | Freq: Once | ORAL | 1 refills | Status: AC

## 2024-05-23 MED ORDER — METRONIDAZOLE 500 MG PO TABS: 500.0000 mg | ORAL_TABLET | Freq: Two times a day (BID) | ORAL | 0 refills | Status: AC

## 2024-05-27 ENCOUNTER — Encounter: Payer: Self-pay | Admitting: *Deleted

## 2024-06-03 ENCOUNTER — Ambulatory Visit

## 2024-06-03 VITALS — BP 117/80 | HR 84 | Wt 163.0 lb

## 2024-06-03 DIAGNOSIS — Z3202 Encounter for pregnancy test, result negative: Secondary | ICD-10-CM

## 2024-06-03 DIAGNOSIS — Z3200 Encounter for pregnancy test, result unknown: Secondary | ICD-10-CM

## 2024-06-03 LAB — POCT URINE PREGNANCY: Preg Test, Ur: NEGATIVE

## 2024-06-03 NOTE — Progress Notes (Signed)
 Patient presents with fatigue, increased appetite, tender breasts and nausea.  Her LMP was 05/10/24 but lasted only 3 days and was lighter in flow.  She reports having had unprotected about a month and has not been talking her birth control regularly.  She also has concerns that she may have PCOS and states has been experiencing hirsutism.    UPT was negative; discussed with patient that a provider visit is recommended to address the symptoms and concerns she has.  Will schedule at check out.    Rosina, RN BSN

## 2024-06-23 ENCOUNTER — Ambulatory Visit: Admitting: Advanced Practice Midwife

## 2024-06-27 ENCOUNTER — Encounter

## 2024-07-22 ENCOUNTER — Ambulatory Visit: Admitting: Obstetrics & Gynecology
